# Patient Record
Sex: Female | Born: 1955 | Race: White | Hispanic: No | Marital: Married | Smoking: Former smoker
Health system: Southern US, Community
[De-identification: ages and names within clinical notes are randomized; demographics above are authoritative.]

## PROBLEM LIST (undated history)

## (undated) DIAGNOSIS — E119 Type 2 diabetes mellitus without complications: Secondary | ICD-10-CM

## (undated) DIAGNOSIS — H269 Unspecified cataract: Secondary | ICD-10-CM

## (undated) DIAGNOSIS — T7840XA Allergy, unspecified, initial encounter: Secondary | ICD-10-CM

## (undated) DIAGNOSIS — I10 Essential (primary) hypertension: Secondary | ICD-10-CM

## (undated) DIAGNOSIS — M8580 Other specified disorders of bone density and structure, unspecified site: Secondary | ICD-10-CM

## (undated) DIAGNOSIS — K219 Gastro-esophageal reflux disease without esophagitis: Secondary | ICD-10-CM

## (undated) DIAGNOSIS — F419 Anxiety disorder, unspecified: Secondary | ICD-10-CM

## (undated) DIAGNOSIS — J45909 Unspecified asthma, uncomplicated: Secondary | ICD-10-CM

## (undated) DIAGNOSIS — H3530 Unspecified macular degeneration: Secondary | ICD-10-CM

## (undated) DIAGNOSIS — L719 Rosacea, unspecified: Secondary | ICD-10-CM

## (undated) HISTORY — DX: Other specified disorders of bone density and structure, unspecified site: M85.80

## (undated) HISTORY — DX: Allergy, unspecified, initial encounter: T78.40XA

## (undated) HISTORY — DX: Unspecified cataract: H26.9

## (undated) HISTORY — DX: Unspecified macular degeneration: H35.30

## (undated) HISTORY — DX: Gastro-esophageal reflux disease without esophagitis: K21.9

## (undated) HISTORY — DX: Essential (primary) hypertension: I10

## (undated) HISTORY — DX: Anxiety disorder, unspecified: F41.9

## (undated) HISTORY — DX: Type 2 diabetes mellitus without complications: E11.9

## (undated) HISTORY — DX: Rosacea, unspecified: L71.9

## (undated) HISTORY — PX: CYST EXCISION: SHX5701

## (undated) HISTORY — DX: Unspecified asthma, uncomplicated: J45.909

---

## 2017-08-02 ENCOUNTER — Encounter: Payer: Self-pay | Admitting: Physician Assistant

## 2017-08-02 ENCOUNTER — Ambulatory Visit (INDEPENDENT_AMBULATORY_CARE_PROVIDER_SITE_OTHER): Payer: Federal, State, Local not specified - PPO | Admitting: Physician Assistant

## 2017-08-02 VITALS — BP 136/80 | HR 78 | Temp 97.8°F | Resp 16 | Ht 61.0 in | Wt 177.6 lb

## 2017-08-02 DIAGNOSIS — Z0000 Encounter for general adult medical examination without abnormal findings: Secondary | ICD-10-CM

## 2017-08-02 DIAGNOSIS — I10 Essential (primary) hypertension: Secondary | ICD-10-CM | POA: Diagnosis not present

## 2017-08-02 DIAGNOSIS — L719 Rosacea, unspecified: Secondary | ICD-10-CM

## 2017-08-02 DIAGNOSIS — E119 Type 2 diabetes mellitus without complications: Secondary | ICD-10-CM | POA: Diagnosis not present

## 2017-08-02 DIAGNOSIS — E118 Type 2 diabetes mellitus with unspecified complications: Secondary | ICD-10-CM | POA: Insufficient documentation

## 2017-08-02 DIAGNOSIS — R945 Abnormal results of liver function studies: Secondary | ICD-10-CM | POA: Diagnosis not present

## 2017-08-02 DIAGNOSIS — I152 Hypertension secondary to endocrine disorders: Secondary | ICD-10-CM | POA: Insufficient documentation

## 2017-08-02 MED ORDER — METFORMIN HCL 500 MG PO TABS: 500.0000 mg | ORAL_TABLET | Freq: Two times a day (BID) | ORAL | 1 refills | Status: DC

## 2017-08-02 MED ORDER — LISINOPRIL 10 MG PO TABS: 10.0000 mg | ORAL_TABLET | Freq: Every day | ORAL | 1 refills | Status: DC

## 2017-08-02 NOTE — Progress Notes (Signed)
Patient ID: Jeanne Shaw MRN: [id number], DOB: [birth year], 61 y.o. Date of Encounter: @DATE @  Chief Complaint:  Chief Complaint  Patient presents with  . New Patient (Initial Visit)    HPI: 61 y.o. year old female  presents as a new patient to establish care.  She reports that she recently moved from [place] to [place].  Moved at the end of September.  Reports that her last office visit with her prior provider was in May and she thinks that her last labs were done around April. Reviewed the fact that she is on medication for diabetes and blood pressure.  I asked if she had ever been on cholesterol medication.  She responds that her cholesterol was discussed several years ago and at that time they discussed diet changes for her to make and that since then--the last time it was checked, she was told that it was okay.  She is uncertain of date of her last CPE but states that she has kept up with doing-- mammogram, colonoscopy, and Pap smear---routinely.  She has history of rosacea and uses for this.  This is well controlled with using this.  She has no specific concerns to address today.   Past Medical History:  Diagnosis Date  . Allergy   . Anxiety   . Asthma   . Diabetes mellitus without complication ([place])   . GERD (gastroesophageal reflux disease)   . Hypertension      Home Meds: Outpatient Medications Prior to Visit  Medication Sig Dispense Refill  . aspirin EC 81 MG tablet Take 81 mg by mouth daily.    . cetirizine (ZYRTEC) 5 MG tablet Take 5 mg by mouth daily.    . cholecalciferol (VITAMIN D) 400 units TABS tablet Take 400 Units by mouth daily.    . Fluticasone Furoate 50 MCG/ACT AEPB Inhale into the lungs.    . hydrocortisone 2.5 % cream Apply 1 application topically 2 (two) times daily as needed.    . hydrOXYzine (ATARAX/VISTARIL) 50 MG tablet Take 50 mg by mouth every 6 (six) hours as needed. 1-2 tablets q6h prn for anxiety    . metroNIDAZOLE (METROGEL)  0.75 % gel Apply 1 application topically daily.    Marland Kitchen MILK THISTLE PO Take by mouth.    Marland Kitchen omeprazole (PRILOSEC) 40 MG capsule Take 40 mg by mouth daily.    Marland Kitchen lisinopril (PRINIVIL,ZESTRIL) 10 MG tablet Take 10 mg by mouth daily.    . metFORMIN (GLUCOPHAGE) 500 MG tablet Take by mouth 2 (two) times daily with a meal.    . metFORMIN (GLUCOPHAGE-XR) 500 MG 24 hr tablet Take 500 mg by mouth daily with breakfast.     No facility-administered medications prior to visit.     Allergies:  Allergies  Allergen Reactions  . Hydrocodone Anxiety    Social History   Socioeconomic History  . Marital status: Married    Spouse name: Not on file  . Number of children: Not on file  . Years of education: Not on file  . Highest education level: Not on file  Social Needs  . Financial resource strain: Not on file  . Food insecurity - worry: Not on file  . Food insecurity - inability: Not on file  . Transportation needs - medical: Not on file  . Transportation needs - non-medical: Not on file  Occupational History  . Not on file  Tobacco Use  . Smoking status: Former Smoker    Last attempt to quit: 08/03/2015  Years since quitting: 2.0  . Smokeless tobacco: Former Network engineer and Sexual Activity  . Alcohol use: Yes    Comment: 1 glass wine daily, beer occassional  . Drug use: No  . Sexual activity: Not on file  Other Topics Concern  . Not on file  Social History Narrative  . Not on file    Family History  Problem Relation Age of Onset  . COPD Mother   . Alcohol abuse Father   . Diabetes Father   . Heart disease Father   . Hyperlipidemia Father   . Hypertension Father   . Cancer Sister   . Diabetes Sister   . Hyperlipidemia Sister   . Hypertension Sister   . Hypertension Brother   . Alcohol abuse Maternal Aunt   . Alcohol abuse Maternal Uncle   . Hypertension Maternal Uncle   . Alcohol abuse Paternal Aunt   . Cancer Paternal Aunt   . Alcohol abuse Paternal Uncle   .  Cancer Paternal Uncle   . Diabetes Maternal Grandmother   . Stroke Maternal Grandmother   . Heart disease Maternal Grandfather      Review of Systems:  See HPI for pertinent ROS. All other ROS negative.    Physical Exam: Blood pressure 136/80, pulse 78, temperature 97.8 F (36.6 C), temperature source Oral, resp. rate 16, height 5\' 1"  (1.549 m), weight 80.6 kg (177 lb 9.6 oz), SpO2 98 %., Body mass index is 33.56 kg/m. General: WNWD WF. Appears in no acute distress. Neck: Supple. No thyromegaly. No lymphadenopathy. No carotid bruits. Lungs: Clear bilaterally to auscultation without wheezes, rales, or rhonchi. Breathing is unlabored. Heart: RRR with S1 S2. No murmurs, rubs, or gallops. Abdomen: Soft, non-tender, non-distended with normoactive bowel sounds. No hepatomegaly. No rebound/guarding. No obvious abdominal masses. Musculoskeletal:  Strength and tone normal for age. Extremities/Skin: Warm and dry.  No LE edema.  Neuro: Alert and oriented X 3. Moves all extremities spontaneously. Gait is normal. CNII-XII grossly in tact. Psych:  Responds to questions appropriately with a normal affect.     ASSESSMENT AND PLAN:  61 y.o. year old female with  1. Encounter for medical examination to establish care  2. Type 2 diabetes mellitus without complication, without long-term current use of insulin ([place]) Today we will check lab and will refill her current dose of metformin. At her next visit, will perform and document diabetic foot exam and will also discuss diabetic eye exam. She is not fasting today.  At some point in the future will recheck FLP and consider statin. She is on aspirin 81 mg daily.  She is on ACE inhibitor. - COMPLETE METABOLIC PANEL WITH GFR - Hemoglobin A1c - metFORMIN (GLUCOPHAGE) 500 MG tablet; Take 1 tablet (500 mg total) by mouth 2 (two) times daily with a meal.  Dispense: 180 tablet; Refill: 1  3. Essential hypertension Blood pressure is at goal.  She is on ACE  inhibitor.  Continue current medication.  Check lab to monitor. - COMPLETE METABOLIC PANEL WITH GFR - lisinopril (PRINIVIL,ZESTRIL) 10 MG tablet; Take 1 tablet (10 mg total) by mouth daily.  Dispense: 90 tablet; Refill: 1  4. Rosacea This is controlled with using MetroGel.  Continue current treatment.  I asked if she had ever been on cholesterol medication.  She responds that her cholesterol was discussed several years ago and at that time they discussed diet changes for her to make and that since then--the last time it was checked, she was told  that it was okay.  She is uncertain of date of her last CPE but states that she has kept up with doing-- mammogram, colonoscopy, and Pap smear---routinely.  She will return for routine follow-up visit in 3 months.  Follow-up sooner if needed. At future visit will do microalbumin--- will wait one year since last OV/lab with prior PCP---will check this ~ May 2019. At future visit also will check FLP. At future visit will also update preventive care.    [address] [place], [place], [facility] 08/02/2017 12:38 PM

## 2017-08-05 LAB — COMPLETE METABOLIC PANEL WITH GFR
AG RATIO: 1.8 (calc) (ref 1.0–2.5)
ALBUMIN MSPROF: 4.2 g/dL (ref 3.6–5.1)
ALT: 174 U/L — ABNORMAL HIGH (ref 6–29)
AST: 58 U/L — ABNORMAL HIGH (ref 10–35)
Alkaline phosphatase (APISO): 128 U/L (ref 33–130)
BUN: 15 mg/dL (ref 7–25)
CALCIUM: 10.2 mg/dL (ref 8.6–10.4)
CO2: 29 mmol/L (ref 20–32)
CREATININE: 0.72 mg/dL (ref 0.50–0.99)
Chloride: 97 mmol/L — ABNORMAL LOW (ref 98–110)
GFR, EST AFRICAN AMERICAN: 104 mL/min/{1.73_m2} (ref >=60)
GFR, EST NON AFRICAN AMERICAN: 90 mL/min/{1.73_m2} (ref >=60)
GLOBULIN: 2.4 g/dL (ref 1.9–3.7)
Glucose, Bld: 367 mg/dL — ABNORMAL HIGH (ref 65–99)
POTASSIUM: 5 mmol/L (ref 3.5–5.3)
SODIUM: 133 mmol/L — AB (ref 135–146)
Total Bilirubin: 0.4 mg/dL (ref 0.2–1.2)
Total Protein: 6.6 g/dL (ref 6.1–8.1)

## 2017-08-05 LAB — HEMOGLOBIN A1C
EAG (MMOL/L): 11.7 (calc)
HEMOGLOBIN A1C: 9 %{Hb} — AB (ref <5.7)
MEAN PLASMA GLUCOSE: 212 (calc)

## 2017-08-05 LAB — HEPATITIS PANEL, ACUTE
HEP A IGM: NONREACTIVE
HEP B S AG: NONREACTIVE
Hep B C IgM: NONREACTIVE
Hepatitis C Ab: NONREACTIVE
SIGNAL TO CUT-OFF: 0.01 (ref <1.00)

## 2017-08-05 LAB — TEST AUTHORIZATION

## 2017-08-09 ENCOUNTER — Other Ambulatory Visit: Payer: Self-pay

## 2017-08-09 DIAGNOSIS — R7989 Other specified abnormal findings of blood chemistry: Secondary | ICD-10-CM

## 2017-08-09 DIAGNOSIS — R945 Abnormal results of liver function studies: Principal | ICD-10-CM

## 2017-08-09 MED ORDER — SITAGLIPTIN PHOSPHATE 100 MG PO TABS: 100.0000 mg | ORAL_TABLET | Freq: Every day | ORAL | 0 refills | Status: DC

## 2017-08-09 MED ORDER — METFORMIN HCL 1000 MG PO TABS: 1000.0000 mg | ORAL_TABLET | Freq: Two times a day (BID) | ORAL | 0 refills | Status: DC

## 2017-08-13 ENCOUNTER — Ambulatory Visit (HOSPITAL_COMMUNITY)
Admission: RE | Admit: 2017-08-13 | Discharge: 2017-08-13 | Disposition: A | Discharge status: Home / Self Care | Payer: Federal, State, Local not specified - PPO | Source: Ambulatory Visit | Attending: Physician Assistant | Admitting: Physician Assistant

## 2017-08-13 DIAGNOSIS — R7989 Other specified abnormal findings of blood chemistry: Secondary | ICD-10-CM | POA: Diagnosis not present

## 2017-08-13 DIAGNOSIS — K760 Fatty (change of) liver, not elsewhere classified: Secondary | ICD-10-CM | POA: Insufficient documentation

## 2017-08-13 DIAGNOSIS — R945 Abnormal results of liver function studies: Secondary | ICD-10-CM

## 2017-08-17 ENCOUNTER — Other Ambulatory Visit: Payer: Self-pay

## 2017-08-17 MED ORDER — FLUTICASONE PROPIONATE 50 MCG/ACT NA SUSP: 2.0000 | Freq: Every day | NASAL | 6 refills | Status: DC

## 2017-11-01 ENCOUNTER — Encounter: Payer: Self-pay | Admitting: Physician Assistant

## 2017-11-01 ENCOUNTER — Ambulatory Visit: Payer: Federal, State, Local not specified - PPO | Admitting: Physician Assistant

## 2017-11-01 ENCOUNTER — Other Ambulatory Visit: Payer: Self-pay

## 2017-11-01 VITALS — BP 132/78 | HR 66 | Temp 97.6°F | Resp 16 | Ht 61.0 in | Wt 174.6 lb

## 2017-11-01 DIAGNOSIS — E119 Type 2 diabetes mellitus without complications: Secondary | ICD-10-CM

## 2017-11-01 DIAGNOSIS — I10 Essential (primary) hypertension: Secondary | ICD-10-CM

## 2017-11-01 DIAGNOSIS — Z1231 Encounter for screening mammogram for malignant neoplasm of breast: Secondary | ICD-10-CM

## 2017-11-01 DIAGNOSIS — Z1239 Encounter for other screening for malignant neoplasm of breast: Secondary | ICD-10-CM | POA: Insufficient documentation

## 2017-11-01 NOTE — Progress Notes (Addendum)
Patient ID: Jeanne Shaw MRN: [id number], DOB: [birth year], 62 y.o. Date of Encounter: @DATE @  Chief Complaint:  Chief Complaint  Patient presents with  . 3 month follow up  . tenderness in right breast    HPI: 62 y.o. year old female     08/02/2017:  presents as a new patient to establish care.  She reports that she recently moved from [place] to [place].  Moved at the end of September.  Reports that her last office visit with her prior provider was in May and she thinks that her last labs were done around April. Reviewed the fact that she is on medication for diabetes and blood pressure.  I asked if she had ever been on cholesterol medication.  She responds that her cholesterol was discussed several years ago and at that time they discussed diet changes for her to make and that since then--the last time it was checked, she was told that it was okay.  She is uncertain of date of her last CPE but states that she has kept up with doing-- mammogram, colonoscopy, and Pap smear---routinely.  She has history of rosacea and uses metronidazole for this.  This is well controlled with using this.  She has no specific concerns to address today.   AT THAT OV: --Checked labs.  A1c  was elevated at 9.0. ----Increase metformin to 1000 mg twice daily ----Add Januvia 100 mg 1 p.o. Daily ALT and AST were slightly elevated.  Hepatitis panel was added and negative.  Ultrasound was obtained and showed findings consistent with hepatic steatosis with no other significant abnormality. Remainder of CMET was normal except for the elevated AST and ALT and glucose elevated at 367.    11/01/2017:  Today I reviewed the above information with patient.   She verifies that she did increase the metformin to 1000 mg twice daily.  Verified that she also did add the Januvia 100 mg daily.  She is taking all of these as directed and is having no adverse effects. Today I asked if her prior provider had  discussed need for diabetic eye exams and whether she was aware of proper foot care.  She states that she had not been informed about the eye exams and has not had eye exam in quite a while.  She is aware of proper foot care because of family members who have diabetes and of had problems with their feet.  Today I reviewed with her to make sure to wear shoes and to make sure that if she ever gets any type of sore or wound to come in and get it evaluated especially if it seems to have delayed healing or problems with infection and healing.  She voices understanding and agrees.  She is requesting that I go ahead and put in a referral "to a good eye doctor ". She reports that she has decreased starches and has decreased alcohol intake.  Says that she has increased fruits and vegetables.  Says that she does eat bananas-- especially if she is having cereal---she  likes banana on her cereal.  Also has been drinking orange juice quite often recently.   I have discussed that the other changes are good-- that she has decreased starches and decreased the alcohol and that she has increased vegetables---but have discussed the need to limit orange juice intake as this has lots her sugar.  Voices understanding and agrees.   Past Medical History:  Diagnosis Date  . Allergy   .  Anxiety   . Asthma   . Diabetes mellitus without complication ([place])   . GERD (gastroesophageal reflux disease)   . Hypertension      Home Meds: Outpatient Medications Prior to Visit  Medication Sig Dispense Refill  . aspirin EC 81 MG tablet Take 81 mg by mouth daily.    . cetirizine (ZYRTEC) 5 MG tablet Take 5 mg by mouth daily.    . cholecalciferol (VITAMIN D) 400 units TABS tablet Take 400 Units by mouth daily.    . hydrocortisone 2.5 % cream Apply 1 application topically 2 (two) times daily as needed.    Marland Kitchen lisinopril (PRINIVIL,ZESTRIL) 10 MG tablet Take 1 tablet (10 mg total) by mouth daily. 90 tablet 1  . metFORMIN (GLUCOPHAGE) 1000  MG tablet Take 1 tablet (1,000 mg total) by mouth 2 (two) times daily with a meal. 180 tablet 0  . metroNIDAZOLE (METROGEL) 0.75 % gel Apply 1 application topically daily.    Marland Kitchen MILK THISTLE PO Take by mouth.    Marland Kitchen omeprazole (PRILOSEC) 40 MG capsule Take 40 mg by mouth daily.    . sitaGLIPtin (JANUVIA) 100 MG tablet Take 1 tablet (100 mg total) by mouth daily. 180 tablet 0  . fluticasone (FLONASE) 50 MCG/ACT nasal spray Place 2 sprays into both nostrils daily. 16 g 6  . hydrOXYzine (ATARAX/VISTARIL) 50 MG tablet Take 50 mg by mouth every 6 (six) hours as needed. 1-2 tablets q6h prn for anxiety    . Fluticasone Furoate 50 MCG/ACT AEPB Inhale into the lungs.     No facility-administered medications prior to visit.     Allergies:  Allergies  Allergen Reactions  . Hydrocodone Anxiety    Social History   Socioeconomic History  . Marital status: Married    Spouse name: Not on file  . Number of children: Not on file  . Years of education: Not on file  . Highest education level: Not on file  Occupational History  . Not on file  Social Needs  . Financial resource strain: Not on file  . Food insecurity:    Worry: Not on file    Inability: Not on file  . Transportation needs:    Medical: Not on file    Non-medical: Not on file  Tobacco Use  . Smoking status: Former Smoker    Last attempt to quit: 08/03/2015    Years since quitting: 2.2  . Smokeless tobacco: Former Network engineer and Sexual Activity  . Alcohol use: Yes    Comment: 1 glass wine daily, beer occassional  . Drug use: No  . Sexual activity: Not on file  Lifestyle  . Physical activity:    Days per week: Not on file    Minutes per session: Not on file  . Stress: Not on file  Relationships  . Social connections:    Talks on phone: Not on file    Gets together: Not on file    Attends religious service: Not on file    Active member of club or organization: Not on file    Attends meetings of clubs or organizations:  Not on file    Relationship status: Not on file  . Intimate partner violence:    Fear of current or ex partner: Not on file    Emotionally abused: Not on file    Physically abused: Not on file    Forced sexual activity: Not on file  Other Topics Concern  . Not on file  Social History  Narrative  . Not on file    Family History  Problem Relation Age of Onset  . COPD Mother   . Alcohol abuse Father   . Diabetes Father   . Heart disease Father   . Hyperlipidemia Father   . Hypertension Father   . Cancer Sister   . Diabetes Sister   . Hyperlipidemia Sister   . Hypertension Sister   . Hypertension Brother   . Alcohol abuse Maternal Aunt   . Alcohol abuse Maternal Uncle   . Hypertension Maternal Uncle   . Alcohol abuse Paternal Aunt   . Cancer Paternal Aunt   . Alcohol abuse Paternal Uncle   . Cancer Paternal Uncle   . Diabetes Maternal Grandmother   . Stroke Maternal Grandmother   . Heart disease Maternal Grandfather      Review of Systems:  See HPI for pertinent ROS. All other ROS negative.    Physical Exam: Blood pressure 132/78, pulse 66, temperature 97.6 F (36.4 C), temperature source Oral, resp. rate 16, height 5\' 1"  (1.549 m), weight 79.2 kg (174 lb 9.6 oz), SpO2 98 %., Body mass index is 32.99 kg/m. General: WNWD WF. Appears in no acute distress. Neck: Supple. No thyromegaly. No lymphadenopathy. No carotid bruits. Lungs: Clear bilaterally to auscultation without wheezes, rales, or rhonchi. Breathing is unlabored. Heart: RRR with S1 S2. No murmurs, rubs, or gallops. Abdomen: Soft, non-tender, non-distended with normoactive bowel sounds. No hepatomegaly. No rebound/guarding. No obvious abdominal masses. Musculoskeletal:  Strength and tone normal for age. Extremities/Skin: Warm and dry.  No LE edema.  Diabetic Foot Exam: Inspection is normal.  No wounds, calluses, or problem areas.   1+ bilateral dorsalis pedis pulses.  Trace posterior tibial pulses bilaterally.   Sensation is intact and normal. Neuro: Alert and oriented X 3. Moves all extremities spontaneously. Gait is normal. CNII-XII grossly in tact. Psych:  Responds to questions appropriately with a normal affect.     ASSESSMENT AND PLAN:  62 y.o. year old female with    1. Essential hypertension 11/01/2017:  Blood Pressure is at goal/controlled.  Lab was normal 07/2017. Continue current medications.   - COMPLETE METABOLIC PANEL WITH GFR  2. Type 2 diabetes mellitus without complication, without long-term current use of insulin ([place]) 11/01/2017:   At lab 08/02/17 -- A1c was elevated.   ---------------------------------------She did increase metformin to 1000 twice daily and she did add Januvia 100 mg daily at that time.   ----------------Re-check A1c now to monitor. --------- day we did discuss proper foot care --------- today we did discuss diabetic eye exam and she prefers that I put in a referral so this is put in. ----------She is fasting so will check a lipid panel today. - COMPLETE METABOLIC PANEL WITH GFR - Lipid panel - Hemoglobin A1c - Ambulatory referral to Ophthalmology  She is on aspirin 81 mg daily.  She is on ACE inhibitor.   3. Breast cancer screening She reports that last mammogram was well over a year ago.  Will order follow-up mammogram. - MM DIGITAL SCREENING BILATERAL; Future   4. Rosacea This is controlled with using MetroGel.  Continue current treatment.   PREVENTIVE CARE:  She is uncertain of date of her last CPE but states that she has kept up with doing-- mammogram, colonoscopy, and Pap smear---routinely.   She will return for routine follow-up visit in 3 months.  Follow-up sooner if needed. At future visit will do microalbumin--- will wait one year since last OV/lab with prior  PCP---will check this ~ May 2019. At future visit will also update preventive care.    Signed, [address] [place], [place], [facility] 11/01/2017 11:18 AM

## 2017-11-02 LAB — HEMOGLOBIN A1C
Hgb A1c MFr Bld: 7.4 % of total Hgb — ABNORMAL HIGH (ref <5.7)
MEAN PLASMA GLUCOSE: 166 (calc)
eAG (mmol/L): 9.2 (calc)

## 2017-11-02 LAB — COMPLETE METABOLIC PANEL WITH GFR
AG Ratio: 1.9 (calc) (ref 1.0–2.5)
ALKALINE PHOSPHATASE (APISO): 78 U/L (ref 33–130)
ALT: 66 U/L — AB (ref 6–29)
AST: 27 U/L (ref 10–35)
Albumin: 4 g/dL (ref 3.6–5.1)
BUN: 16 mg/dL (ref 7–25)
CHLORIDE: 105 mmol/L (ref 98–110)
CO2: 28 mmol/L (ref 20–32)
Calcium: 9.6 mg/dL (ref 8.6–10.4)
Creat: 0.7 mg/dL (ref 0.50–0.99)
GFR, Est African American: 108 mL/min/{1.73_m2} (ref >=60)
GFR, Est Non African American: 93 mL/min/{1.73_m2} (ref >=60)
Globulin: 2.1 g/dL (calc) (ref 1.9–3.7)
Glucose, Bld: 168 mg/dL — ABNORMAL HIGH (ref 65–99)
Potassium: 5.1 mmol/L (ref 3.5–5.3)
Sodium: 140 mmol/L (ref 135–146)
Total Bilirubin: 0.4 mg/dL (ref 0.2–1.2)
Total Protein: 6.1 g/dL (ref 6.1–8.1)

## 2017-11-02 LAB — LIPID PANEL
CHOLESTEROL: 161 mg/dL (ref <200)
HDL: 31 mg/dL — AB (ref >50)
LDL Cholesterol (Calc): 96 mg/dL (calc)
Non-HDL Cholesterol (Calc): 130 mg/dL (calc) — ABNORMAL HIGH (ref <130)
TRIGLYCERIDES: 217 mg/dL — AB (ref <150)
Total CHOL/HDL Ratio: 5.2 (calc) — ABNORMAL HIGH (ref <5.0)

## 2017-11-04 ENCOUNTER — Telehealth: Payer: Self-pay

## 2017-11-04 ENCOUNTER — Encounter: Payer: Self-pay | Admitting: Physician Assistant

## 2017-11-04 DIAGNOSIS — N644 Mastodynia: Secondary | ICD-10-CM

## 2017-11-04 NOTE — Telephone Encounter (Signed)
FYI: Patient  Sent email stating because she had breast tenderness we needed to change mammogram order from screening to diagnostic.Change has been made

## 2017-11-04 NOTE — Telephone Encounter (Signed)
Approved.  

## 2017-11-05 ENCOUNTER — Other Ambulatory Visit: Payer: Self-pay

## 2017-11-05 MED ORDER — SIMVASTATIN 10 MG PO TABS: 10.0000 mg | ORAL_TABLET | Freq: Every day | ORAL | 2 refills | Status: DC

## 2017-11-05 NOTE — Progress Notes (Signed)
simvastatin 

## 2017-11-10 ENCOUNTER — Encounter: Payer: Self-pay | Admitting: Physician Assistant

## 2017-11-15 NOTE — Progress Notes (Signed)
[facility] Note  11/16/2017     CHIEF COMPLAINT Patient presents for Diabetic Eye Exam   HISTORY OF PRESENT ILLNESS: Jeanne Shaw is a 62 y.o. female who presents to the clinic today for:   HPI    Diabetic Eye Exam    Vision is stable.  Associated Symptoms Negative for Flashes, Distortion, Pain, Photophobia, Trauma, Jaw Claudication, Fever, Fatigue, Weight Loss, Shoulder/Hip pain, Scalp Tenderness, Glare, Blind Spot, Redness and Floaters.  Diabetes characteristics include Type 2.  This started 1 year ago.  Last Blood Glucose: Doesn't check.  Last A1C 7.4.  I, the attending physician,  performed the HPI with the patient and updated documentation appropriately.          Comments    Pt presents for DM exam; Pt states she was dx with DM x 1 year; Pt states she does not check CBG at home; Pt states last A1C was 7.4; Pt states OU VA is stabel; pt states she has trouble with "watery eyes"; Pt denies floaters, denies flashes, denies wavy VA, denies ocular pain; Pt denies any gtts usage, denies taking any eye vits;        Last edited by Bernarda Caffey, MD on 11/16/2017  9:26 AM. (History)      Referring physician: Orlena Sheldon, PA-C [place] [address] [place] [address]  HISTORICAL INFORMATION:   Selected notes from the MEDICAL RECORD NUMBER Referred by M. Dixon, PA-C for DM exam;  LEE-  Ocular Hx-  PMH- DM (A1C 7.4 on 04.01.19), HTN, asthma,     CURRENT MEDICATIONS: No current outpatient medications on file. (Ophthalmic Drugs)   No current facility-administered medications for this visit.  (Ophthalmic Drugs)   Current Outpatient Medications (Other)  Medication Sig  . aspirin EC 81 MG tablet Take 81 mg by mouth daily.  . cetirizine (ZYRTEC) 5 MG tablet Take 5 mg by mouth daily.  . cholecalciferol (VITAMIN D) 400 units TABS tablet Take 400 Units by mouth daily.  . hydrocortisone 2.5 % cream Apply 1 application topically 2 (two) times  daily as needed.  . hydrOXYzine (ATARAX/VISTARIL) 50 MG tablet Take 50 mg by mouth every 6 (six) hours as needed. 1-2 tablets q6h prn for anxiety  . lisinopril (PRINIVIL,ZESTRIL) 10 MG tablet Take 1 tablet (10 mg total) by mouth daily.  . metFORMIN (GLUCOPHAGE) 1000 MG tablet Take 1 tablet (1,000 mg total) by mouth 2 (two) times daily with a meal.  . metroNIDAZOLE (METROGEL) 0.75 % gel Apply 1 application topically daily.  Marland Kitchen MILK THISTLE PO Take by mouth.  Marland Kitchen omeprazole (PRILOSEC) 40 MG capsule Take 40 mg by mouth daily.  . simvastatin (ZOCOR) 10 MG tablet Take 1 tablet (10 mg total) by mouth at bedtime.  . sitaGLIPtin (JANUVIA) 100 MG tablet Take 1 tablet (100 mg total) by mouth daily.   No current facility-administered medications for this visit.  (Other)      REVIEW OF SYSTEMS: ROS    Positive for: Endocrine, Cardiovascular   Negative for: Constitutional, Gastrointestinal, Neurological, Skin, Genitourinary, Musculoskeletal, HENT, Eyes, Respiratory, Psychiatric, Allergic/Imm, Heme/Lymph   Last edited by Cherrie Gauze, COA on 11/16/2017  9:13 AM. (History)       ALLERGIES Allergies  Allergen Reactions  . Hydrocodone Anxiety    PAST MEDICAL HISTORY Past Medical History:  Diagnosis Date  . Allergy   . Anxiety   . Asthma   . Diabetes mellitus without complication ([place])   .  GERD (gastroesophageal reflux disease)   . Hypertension    History reviewed. No pertinent surgical history.  FAMILY HISTORY Family History  Problem Relation Age of Onset  . COPD Mother   . Alcohol abuse Father   . Diabetes Father   . Heart disease Father   . Hyperlipidemia Father   . Hypertension Father   . Cancer Sister   . Diabetes Sister   . Hyperlipidemia Sister   . Hypertension Sister   . Hypertension Brother   . Alcohol abuse Maternal Aunt   . Alcohol abuse Maternal Uncle   . Hypertension Maternal Uncle   . Alcohol abuse Paternal Aunt   . Cancer Paternal Aunt   . Alcohol abuse  Paternal Uncle   . Cancer Paternal Uncle   . Diabetes Maternal Grandmother   . Stroke Maternal Grandmother   . Heart disease Maternal Grandfather     SOCIAL HISTORY Social History   Tobacco Use  . Smoking status: Former Smoker    Last attempt to quit: 08/03/2015    Years since quitting: 2.2  . Smokeless tobacco: Former Network engineer Use Topics  . Alcohol use: Yes    Comment: 1 glass wine daily, beer occassional  . Drug use: No         OPHTHALMIC EXAM:  Base Eye Exam    Visual Acuity (Snellen - Linear)      Right Left   Dist cc 20/20 20/25   Dist ph cc 20/20 20/20   Correction:  Glasses       Tonometry (Tonopen, 9:24 AM)      Right Left   Pressure 17 18       Pupils      Dark Light Shape React APD   Right 4 3 Round Slow None   Left 4 3 Round Slow None       Visual Fields (Counting fingers)      Left Right    Full Full       Extraocular Movement      Right Left    Full, Ortho Full, Ortho       Neuro/Psych    Oriented x3:  Yes   Mood/Affect:  Normal       Dilation    Both eyes:  1.0% Mydriacyl, 2.5% Phenylephrine @ 9:24 AM        Slit Lamp and Fundus Exam    Slit Lamp Exam      Right Left   Lids/Lashes Dermatochalasis - upper lid Dermatochalasis - upper lid   Conjunctiva/Sclera White and quiet White and quiet   Cornea Clear Clear   Anterior Chamber Deep and quiet Deep and quiet   Iris Round and dilated, No NVI Round and dilated, No NVI   Lens 2+ Nuclear sclerosis, 2+ Cortical cataract 2+ Nuclear sclerosis, 2+ Cortical cataract   Vitreous Mild Vitreous syneresis Mild Vitreous syneresis       Fundus Exam      Right Left   Disc Pink and Sharp Pink and Sharp   C/D Ratio 0.3 0.2   Macula Good foveal reflex, mild RPE mottling and clumping, focal PED superionasal to fovea, No heme or edema Flat, mild RPE mottling and clumping, Drusen, PEDs, No heme or edema   Vessels Mild Copper wiring, AV crossing changes Mild Vascular attenuation, mild AV  crossing changes   Periphery Attached Attached, patch of cobblestoning at 0600        Refraction    Wearing Rx  Sphere Cylinder Axis Add   Right +1.00 +1.50 175 +2.25   Left +1.25 +1.25 018 +2.25   Age:  79yr   Type:  PAL       Manifest Refraction      Sphere Cylinder Axis Dist VA   Right +1.25 +1.50 175 20/20   Left +1.50 +1.25 018 20/20          IMAGING AND PROCEDURES  Imaging and Procedures for 11/16/17  OCT, Retina - OU - Both Eyes       Right Eye Quality was good. Central Foveal Thickness: 294. Progression has no prior data. Findings include normal foveal contour, no IRF, no SRF, pigment epithelial detachment.   Left Eye Quality was good. Central Foveal Thickness: 301. Progression has no prior data. Findings include normal foveal contour, no IRF, no SRF, pigment epithelial detachment, intraretinal hyper-reflective material, retinal drusen .   Notes *Images captured and stored on drive  Diagnosis / Impression: NFP, No IRF/SRF OU Drusen and PEDs OS>OD No DME OU   Clinical management:  See below  Abbreviations: NFP - Normal foveal profile. CME - cystoid macular edema. PED - pigment epithelial detachment. IRF - intraretinal fluid. SRF - subretinal fluid. EZ - ellipsoid zone. ERM - epiretinal membrane. ORA - outer retinal atrophy. ORT - outer retinal tubulation. SRHM - subretinal hyper-reflective material                  ASSESSMENT/PLAN:    ICD-10-CM   1. Diabetes mellitus type 2 without retinopathy ([place]) E11.9   2. Early dry stage nonexudative age-related macular degeneration of both eyes H35.3131   3. Retinal edema H35.81 OCT, Retina - OU - Both Eyes  4. Combined forms of age-related cataract of both eyes H25.813     1. Diabetes mellitus, type 2 without retinopathy - The incidence, risk factors for progression, natural history and treatment options for diabetic retinopathy  were discussed with patient.   - The need for close monitoring of  blood glucose, blood pressure, and serum lipids, avoiding cigarette or any type of tobacco, and the need for long term follow up was also discussed with patient. - f/u in 1 year, sooner prn  2. Non-Exudative ARMD OU-   - scattered central drusen and PEDs OU (OS > OD)  - The incidence, anatomy, and pathology of dry AMD, risk of progression, and the AREDS and AREDS 2 study including smoking risks discussed with patient.  - Recommend amsler grid monitoring  - f/u 2-3 months  3. No retinal edema on exam or OCT  4. Combined form age-related cataract OU- - The symptoms of cataract, surgical options, and treatments and risks were discussed with patient. - discussed diagnosis and progression - not yet visually significant - monitor for now   Ophthalmic Meds Ordered this visit:  No orders of the defined types were placed in this encounter.      Return in about 3 months (around 02/15/2018) for F/U Non-Exu AMD OU, Dilated exam, OCT.  There are no Patient Instructions on file for this visit.   Explained the diagnoses, plan, and follow up with the patient and they expressed understanding.  Patient expressed understanding of the importance of proper follow up care.   This document serves as a record of services personally performed by Gardiner Sleeper, MD, PhD. It was created on their behalf by Catha Brow, [place], a certified ophthalmic assistant. The creation of this record is the provider's dictation and/or activities during the visit.  Electronically signed by: Catha Brow, [place]  11/16/17 12:20 PM   Gardiner Sleeper, M.D., Ph.D. Diseases & Surgery of the Retina and [place] 11/16/17   I have reviewed the above documentation for accuracy and completeness, and I agree with the above. Gardiner Sleeper, M.D., Ph.D. 11/16/17 12:21 PM    Abbreviations: M myopia (nearsighted); A astigmatism; H hyperopia (farsighted); P presbyopia; Mrx spectacle  prescription;  CTL contact lenses; OD right eye; OS left eye; OU both eyes  XT exotropia; ET esotropia; PEK punctate epithelial keratitis; PEE punctate epithelial erosions; DES dry eye syndrome; MGD meibomian gland dysfunction; ATs artificial tears; PFAT's preservative free artificial tears; [place] nuclear sclerotic cataract; PSC posterior subcapsular cataract; ERM epi-retinal membrane; PVD posterior vitreous detachment; RD retinal detachment; DM diabetes mellitus; DR diabetic retinopathy; NPDR non-proliferative diabetic retinopathy; PDR proliferative diabetic retinopathy; CSME clinically significant macular edema; DME diabetic macular edema; dbh dot blot hemorrhages; CWS cotton wool spot; POAG primary open angle glaucoma; C/D cup-to-disc ratio; HVF humphrey visual field; GVF goldmann visual field; OCT optical coherence tomography; IOP intraocular pressure; BRVO Branch retinal vein occlusion; CRVO central retinal vein occlusion; CRAO central retinal artery occlusion; BRAO branch retinal artery occlusion; RT retinal tear; SB scleral buckle; PPV pars plana vitrectomy; VH Vitreous hemorrhage; PRP panretinal laser photocoagulation; IVK intravitreal kenalog; VMT vitreomacular traction; MH Macular hole;  NVD neovascularization of the disc; NVE neovascularization elsewhere; AREDS age related eye disease study; ARMD age related macular degeneration; POAG primary open angle glaucoma; EBMD epithelial/anterior basement membrane dystrophy; ACIOL anterior chamber intraocular lens; IOL intraocular lens; PCIOL posterior chamber intraocular lens; Phaco/IOL phacoemulsification with intraocular lens placement; [place] photorefractive keratectomy; LASIK laser assisted in situ keratomileusis; HTN hypertension; DM diabetes mellitus; COPD chronic obstructive pulmonary disease

## 2017-11-16 ENCOUNTER — Ambulatory Visit (INDEPENDENT_AMBULATORY_CARE_PROVIDER_SITE_OTHER): Payer: Federal, State, Local not specified - PPO | Admitting: Ophthalmology

## 2017-11-16 ENCOUNTER — Encounter (INDEPENDENT_AMBULATORY_CARE_PROVIDER_SITE_OTHER): Payer: Self-pay | Admitting: Ophthalmology

## 2017-11-16 DIAGNOSIS — H3581 Retinal edema: Secondary | ICD-10-CM | POA: Diagnosis not present

## 2017-11-16 DIAGNOSIS — E119 Type 2 diabetes mellitus without complications: Secondary | ICD-10-CM

## 2017-11-16 DIAGNOSIS — H353131 Nonexudative age-related macular degeneration, bilateral, early dry stage: Secondary | ICD-10-CM | POA: Diagnosis not present

## 2017-11-16 DIAGNOSIS — H25813 Combined forms of age-related cataract, bilateral: Secondary | ICD-10-CM | POA: Diagnosis not present

## 2017-12-09 ENCOUNTER — Other Ambulatory Visit: Payer: Self-pay | Admitting: Physician Assistant

## 2017-12-09 DIAGNOSIS — N644 Mastodynia: Secondary | ICD-10-CM

## 2017-12-15 ENCOUNTER — Ambulatory Visit
Admission: RE | Admit: 2017-12-15 | Discharge: 2017-12-15 | Disposition: A | Discharge status: Home / Self Care | Payer: Federal, State, Local not specified - PPO | Source: Ambulatory Visit | Attending: Physician Assistant | Admitting: Physician Assistant

## 2017-12-15 ENCOUNTER — Ambulatory Visit: Payer: Federal, State, Local not specified - PPO

## 2017-12-15 DIAGNOSIS — N644 Mastodynia: Secondary | ICD-10-CM

## 2017-12-15 DIAGNOSIS — R922 Inconclusive mammogram: Secondary | ICD-10-CM | POA: Diagnosis not present

## 2018-01-05 ENCOUNTER — Other Ambulatory Visit: Payer: Self-pay

## 2018-01-05 DIAGNOSIS — I10 Essential (primary) hypertension: Secondary | ICD-10-CM

## 2018-01-05 MED ORDER — CETIRIZINE HCL 5 MG PO TABS: 5.0000 mg | ORAL_TABLET | Freq: Every day | ORAL | 1 refills | Status: DC

## 2018-01-05 MED ORDER — LISINOPRIL 10 MG PO TABS: 10.0000 mg | ORAL_TABLET | Freq: Every day | ORAL | 1 refills | Status: DC

## 2018-01-11 ENCOUNTER — Encounter: Payer: Self-pay | Admitting: Physician Assistant

## 2018-01-11 MED ORDER — OMEPRAZOLE 40 MG PO CPDR: 40.0000 mg | DELAYED_RELEASE_CAPSULE | Freq: Every day | ORAL | 3 refills | Status: DC

## 2018-02-02 ENCOUNTER — Encounter: Payer: Self-pay | Admitting: Physician Assistant

## 2018-02-02 ENCOUNTER — Other Ambulatory Visit: Payer: Self-pay

## 2018-02-02 ENCOUNTER — Ambulatory Visit: Payer: Federal, State, Local not specified - PPO | Admitting: Physician Assistant

## 2018-02-02 VITALS — BP 126/70 | HR 87 | Temp 98.5°F | Resp 16 | Ht 61.0 in | Wt 172.2 lb

## 2018-02-02 DIAGNOSIS — E785 Hyperlipidemia, unspecified: Secondary | ICD-10-CM | POA: Diagnosis not present

## 2018-02-02 DIAGNOSIS — E119 Type 2 diabetes mellitus without complications: Secondary | ICD-10-CM | POA: Diagnosis not present

## 2018-02-02 DIAGNOSIS — L719 Rosacea, unspecified: Secondary | ICD-10-CM | POA: Diagnosis not present

## 2018-02-02 DIAGNOSIS — Z1231 Encounter for screening mammogram for malignant neoplasm of breast: Secondary | ICD-10-CM

## 2018-02-02 DIAGNOSIS — I10 Essential (primary) hypertension: Secondary | ICD-10-CM | POA: Diagnosis not present

## 2018-02-02 DIAGNOSIS — Z1239 Encounter for other screening for malignant neoplasm of breast: Secondary | ICD-10-CM

## 2018-02-02 DIAGNOSIS — E1169 Type 2 diabetes mellitus with other specified complication: Secondary | ICD-10-CM | POA: Insufficient documentation

## 2018-02-02 MED ORDER — CETIRIZINE HCL 5 MG PO TABS: 5.0000 mg | ORAL_TABLET | Freq: Every day | ORAL | 2 refills | Status: DC

## 2018-02-02 MED ORDER — METFORMIN HCL 1000 MG PO TABS: 1000.0000 mg | ORAL_TABLET | Freq: Two times a day (BID) | ORAL | 2 refills | Status: DC

## 2018-02-02 MED ORDER — SIMVASTATIN 10 MG PO TABS: 10.0000 mg | ORAL_TABLET | Freq: Every day | ORAL | 2 refills | Status: DC

## 2018-02-02 NOTE — Progress Notes (Signed)
Patient ID: Jeanne Shaw MRN: [id number], DOB: 1956-02-06, 62 y.o. Date of Encounter: @DATE @  Chief Complaint:  No chief complaint on file.   HPI: 62 y.o. year old female     08/02/2017:  presents as a new patient to establish care.  She reports that she recently moved from [place] to [place].  Moved at the end of September.  Reports that her last office visit with her prior provider was in May and she thinks that her last labs were done around April. Reviewed the fact that she is on medication for diabetes and blood pressure.  I asked if she had ever been on cholesterol medication.  She responds that her cholesterol was discussed several years ago and at that time they discussed diet changes for her to make and that since then--the last time it was checked, she was told that it was okay.  She is uncertain of date of her last CPE but states that she has kept up with doing-- mammogram, colonoscopy, and Pap smear---routinely.  She has history of rosacea and uses metronidazole for this.  This is well controlled with using this.  She has no specific concerns to address today.   AT THAT OV: --Checked labs.  A1c  was elevated at 9.0. ----Increase metformin to 1000 mg twice daily ----Add Januvia 100 mg 1 p.o. Daily ALT and AST were slightly elevated.  Hepatitis panel was added and negative.  Ultrasound was obtained and showed findings consistent with hepatic steatosis with no other significant abnormality. Remainder of CMET was normal except for the elevated AST and ALT and glucose elevated at 367.    11/01/2017:  Today I reviewed the above information with patient.   She verifies that she did increase the metformin to 1000 mg twice daily.  Verified that she also did add the Januvia 100 mg daily.  She is taking all of these as directed and is having no adverse effects. Today I asked if her prior provider had discussed need for diabetic eye exams and whether she was  aware of proper foot care.  She states that she had not been informed about the eye exams and has not had eye exam in quite a while.  She is aware of proper foot care because of family members who have diabetes and of had problems with their feet.  Today I reviewed with her to make sure to wear shoes and to make sure that if she ever gets any type of sore or wound to come in and get it evaluated especially if it seems to have delayed healing or problems with infection and healing.  She voices understanding and agrees.  She is requesting that I go ahead and put in a referral "to a good eye doctor ". She reports that she has decreased starches and has decreased alcohol intake.  Says that she has increased fruits and vegetables.  Says that she does eat bananas-- especially if she is having cereal---she  likes banana on her cereal.  Also has been drinking orange juice quite often recently.   I have discussed that the other changes are good-- that she has decreased starches and decreased the alcohol and that she has increased vegetables---but have discussed the need to limit orange juice intake as this has lots her sugar.  Voices understanding and agrees.   02/02/2018: I reviewed her lab results from 11/01/2017.  LDL was 96.  Recommended for her to add simvastatin 10 mg daily. Today she reports  that she did start the simvastatin and is taking this daily.  Is having no myalgias or other adverse effects. Also reviewed that at her lab 11/01/2017-- A1c was 7.4.  Reviewed that this was still a little high but we had just made a lot of medication changes and that at last visit had discussed decreasing the orange juice and other sugars.  Planned to recheck A1c today before making further changes in meds. She does continue to take the metformin and the Januvia.  These are causing no adverse effects. Ports that she did decrease the orange juice.  Says that they have been eating a lot more vegetables since it is summer.  They  have a garden. She has small amount of small scattered vesicles/papules along ankles bilaterally.  Told her this looks like a mild case of poison oak or poison ivy.  She states that she is going out in the garden almost every day and may be coming in contact with something almost daily when she goes out there.  The areas have been there for couple weeks.  However they are staying down at the low legs ankle level.  Have not been spreading to other areas of the body.  Discussed using oral prednisone but she really does not want to use this unless absolutely necessary.  Prefers to use topical cortisone and calamine lotion.  We will follow-up if rash worsens then would have to add some prednisone. No other concerns to address today.    Past Medical History:  Diagnosis Date  . Allergy   . Anxiety   . Asthma   . Diabetes mellitus without complication ([place])   . GERD (gastroesophageal reflux disease)   . Hypertension      Home Meds: Outpatient Medications Prior to Visit  Medication Sig Dispense Refill  . aspirin EC 81 MG tablet Take 81 mg by mouth daily.    . cetirizine (ZYRTEC) 5 MG tablet Take 1 tablet (5 mg total) by mouth daily. 30 tablet 1  . cholecalciferol (VITAMIN D) 400 units TABS tablet Take 400 Units by mouth daily.    . hydrocortisone 2.5 % cream Apply 1 application topically 2 (two) times daily as needed.    . hydrOXYzine (ATARAX/VISTARIL) 50 MG tablet Take 50 mg by mouth every 6 (six) hours as needed. 1-2 tablets q6h prn for anxiety    . lisinopril (PRINIVIL,ZESTRIL) 10 MG tablet Take 1 tablet (10 mg total) by mouth daily. 90 tablet 1  . metFORMIN (GLUCOPHAGE) 1000 MG tablet Take 1 tablet (1,000 mg total) by mouth 2 (two) times daily with a meal. 180 tablet 0  . metroNIDAZOLE (METROGEL) 0.75 % gel Apply 1 application topically daily.    Marland Kitchen MILK THISTLE PO Take by mouth.    Marland Kitchen omeprazole (PRILOSEC) 40 MG capsule Take 1 capsule (40 mg total) by mouth daily. 90 capsule 3  . simvastatin  (ZOCOR) 10 MG tablet Take 1 tablet (10 mg total) by mouth at bedtime. 30 tablet 2  . sitaGLIPtin (JANUVIA) 100 MG tablet Take 1 tablet (100 mg total) by mouth daily. 180 tablet 0   No facility-administered medications prior to visit.     Allergies:  Allergies  Allergen Reactions  . Hydrocodone Anxiety    Social History   Socioeconomic History  . Marital status: Married    Spouse name: Not on file  . Number of children: Not on file  . Years of education: Not on file  . Highest education level: Not on file  Occupational History  . Not on file  Social Needs  . Financial resource strain: Not on file  . Food insecurity:    Worry: Not on file    Inability: Not on file  . Transportation needs:    Medical: Not on file    Non-medical: Not on file  Tobacco Use  . Smoking status: Former Smoker    Last attempt to quit: 08/03/2015    Years since quitting: 2.5  . Smokeless tobacco: Former Network engineer and Sexual Activity  . Alcohol use: Yes    Comment: 1 glass wine daily, beer occassional  . Drug use: No  . Sexual activity: Not on file  Lifestyle  . Physical activity:    Days per week: Not on file    Minutes per session: Not on file  . Stress: Not on file  Relationships  . Social connections:    Talks on phone: Not on file    Gets together: Not on file    Attends religious service: Not on file    Active member of club or organization: Not on file    Attends meetings of clubs or organizations: Not on file    Relationship status: Not on file  . Intimate partner violence:    Fear of current or ex partner: Not on file    Emotionally abused: Not on file    Physically abused: Not on file    Forced sexual activity: Not on file  Other Topics Concern  . Not on file  Social History Narrative  . Not on file    Family History  Problem Relation Age of Onset  . COPD Mother   . Alcohol abuse Father   . Diabetes Father   . Heart disease Father   . Hyperlipidemia Father   .  Hypertension Father   . Cancer Sister   . Diabetes Sister   . Hyperlipidemia Sister   . Hypertension Sister   . Breast cancer Sister 40  . Hypertension Brother   . Alcohol abuse Maternal Aunt   . Alcohol abuse Maternal Uncle   . Hypertension Maternal Uncle   . Alcohol abuse Paternal Aunt   . Cancer Paternal Aunt   . Alcohol abuse Paternal Uncle   . Cancer Paternal Uncle   . Diabetes Maternal Grandmother   . Stroke Maternal Grandmother   . Heart disease Maternal Grandfather      Review of Systems:  See HPI for pertinent ROS. All other ROS negative.    Physical Exam: Blood pressure 126/70, pulse 87, temperature 98.5 F (36.9 C), temperature source Oral, resp. rate 16, height 5\' 1"  (1.549 m), weight 78.1 kg (172 lb 3.2 oz), SpO2 98 %., Body mass index is 32.54 kg/m. General: Overweight WF. Appears in no acute distress. Neck: Supple. No thyromegaly. No lymphadenopathy. No carotid bruits.  Lungs: Clear bilaterally to auscultation without wheezes, rales, or rhonchi. Breathing is unlabored. Heart: RRR with S1 S2. No murmurs, rubs, or gallops. Abdomen: Soft, non-tender, non-distended with normoactive bowel sounds. No hepatomegaly. No rebound/guarding. No obvious abdominal masses. Musculoskeletal:  Strength and tone normal for age. Extremities/Skin: Warm and dry.  No edema.  Both lower legs she has just a few scattered very small papules and vesicles.  No other areas of rash. Neuro: Alert and oriented X 3. Moves all extremities spontaneously. Gait is normal. CNII-XII grossly in tact. Psych:  Responds to questions appropriately with a normal affect.   Diabetic Foot Exam: Inspection is normal.  No wounds,  calluses, or problem areas.   1+ bilateral dorsalis pedis pulses.  Trace posterior tibial pulses bilaterally.  Sensation is intact and normal.     ASSESSMENT AND PLAN:  62 y.o. year old female with      Type 2 diabetes mellitus without complication, without long-term current use  of insulin ([place]) 11/01/2017:   At lab 08/02/17 -- A1c was elevated.   ---------------------------------------She did increase metformin to 1000 twice daily and she did add Januvia 100 mg daily at that time.   ----------------Re-check A1c now to monitor. --------- day we did discuss proper foot care --------- today we did discuss diabetic eye exam and she prefers that I put in a referral so this is put in. ----------She is fasting so will check a lipid panel today. - COMPLETE METABOLIC PANEL WITH GFR - Lipid panel - Hemoglobin A1c - Ambulatory referral to Ophthalmology  02/02/2018: Diabetes is currently stable and controlled.  Continue current medications.  Recheck A1c.  Check microalbumin. - COMPLETE METABOLIC PANEL WITH GFR - Hemoglobin A1c - Microalbumin, urine  She is on aspirin 81 mg daily.  She is on ACE inhibitor.   Essential hypertension 11/01/2017:  Blood Pressure is at goal/controlled.  Lab was normal 07/2017. Continue current medications.   02/02/2018: She is on ACE inhibitor.  Blood pressure is at goal.  Check lab to monitor. - COMPLETE METABOLIC PANEL WITH GFR  Hyperlipidemia, unspecified hyperlipidemia type 02/02/2018: Labs 11/01/2017 we started simvastatin 10 mg daily.  Today she reports she is taking the simvastatin 10 mg daily.  Recheck FLP LFT today to monitor. - COMPLETE METABOLIC PANEL WITH GFR - Lipid panel   Breast cancer screening She reports that last mammogram was well over a year ago.  Will order follow-up mammogram. - MM DIGITAL SCREENING BILATERAL; Future 02/02/2018: She did have mammogram 12/15/2017.  Negative.  Rosacea This is controlled with using MetroGel.  Continue current treatment.   PREVENTIVE CARE:  She is uncertain of date of her last CPE but states that she has kept up with doing-- mammogram, colonoscopy, and Pap smear---routinely.   She will return for routine follow-up visit in 3 months.  Follow-up sooner if needed. At future visit will also  update preventive care.    Signed, [address] [place], [place], BSFM 02/02/2018 8:00 AM

## 2018-02-03 LAB — LIPID PANEL
CHOL/HDL RATIO: 3.5 (calc) (ref <5.0)
CHOLESTEROL: 114 mg/dL (ref <200)
HDL: 33 mg/dL — ABNORMAL LOW (ref >50)
LDL Cholesterol (Calc): 60 mg/dL (calc)
NON-HDL CHOLESTEROL (CALC): 81 mg/dL (ref <130)
TRIGLYCERIDES: 125 mg/dL (ref <150)

## 2018-02-03 LAB — MICROALBUMIN, URINE: Microalb, Ur: 0.4 mg/dL

## 2018-02-03 LAB — COMPLETE METABOLIC PANEL WITH GFR
AG Ratio: 1.8 (calc) (ref 1.0–2.5)
ALBUMIN MSPROF: 4.2 g/dL (ref 3.6–5.1)
ALKALINE PHOSPHATASE (APISO): 73 U/L (ref 33–130)
ALT: 43 U/L — ABNORMAL HIGH (ref 6–29)
AST: 23 U/L (ref 10–35)
BUN: 16 mg/dL (ref 7–25)
CO2: 25 mmol/L (ref 20–32)
CREATININE: 0.74 mg/dL (ref 0.50–0.99)
Calcium: 9.8 mg/dL (ref 8.6–10.4)
Chloride: 102 mmol/L (ref 98–110)
GFR, EST AFRICAN AMERICAN: 101 mL/min/{1.73_m2} (ref >=60)
GFR, Est Non African American: 87 mL/min/{1.73_m2} (ref >=60)
Globulin: 2.3 g/dL (calc) (ref 1.9–3.7)
Glucose, Bld: 146 mg/dL — ABNORMAL HIGH (ref 65–99)
Potassium: 5 mmol/L (ref 3.5–5.3)
SODIUM: 137 mmol/L (ref 135–146)
TOTAL PROTEIN: 6.5 g/dL (ref 6.1–8.1)
Total Bilirubin: 0.3 mg/dL (ref 0.2–1.2)

## 2018-02-03 LAB — HEMOGLOBIN A1C
HEMOGLOBIN A1C: 7 %{Hb} — AB (ref <5.7)
Mean Plasma Glucose: 154 (calc)
eAG (mmol/L): 8.5 (calc)

## 2018-02-13 ENCOUNTER — Other Ambulatory Visit: Payer: Self-pay | Admitting: Physician Assistant

## 2018-02-14 NOTE — Progress Notes (Signed)
[facility] Note  02/15/2018     CHIEF COMPLAINT Patient presents for Retina Follow Up   HISTORY OF PRESENT ILLNESS: Jeanne Shaw is a 62 y.o. female who presents to the clinic today for:   HPI    Retina Follow Up    Patient presents with  Dry AMD.  In both eyes.  This started 3 months ago.  Severity is mild.  Since onset it is stable.  I, the attending physician,  performed the HPI with the patient and updated documentation appropriately.          Comments    F/U Non-Exu AMD OD. Patient states she has "occasional watery eyes, but other than that her vision is about the same" , denies new visual onsets. Pt reports she does not monitor Bs, but BS are stable per patient. Last A1C 7. appx three months ago.       Last edited by Bernarda Caffey, MD on 02/15/2018  9:37 AM. (History)      Referring physician: Orlena Sheldon, PA-C [place] [address], [place] [address]  HISTORICAL INFORMATION:   Selected notes from the MEDICAL RECORD NUMBER Referred by M. Dixon, PA-C for DM exam;  LEE-  Ocular Hx-  PMH- DM (A1C 7.4 on 04.01.19), HTN, asthma,     CURRENT MEDICATIONS: No current outpatient medications on file. (Ophthalmic Drugs)   No current facility-administered medications for this visit.  (Ophthalmic Drugs)   Current Outpatient Medications (Other)  Medication Sig  . aspirin EC 81 MG tablet Take 81 mg by mouth daily.  . cetirizine (ZYRTEC) 5 MG tablet Take 1 tablet (5 mg total) by mouth daily.  . cholecalciferol (VITAMIN D) 400 units TABS tablet Take 400 Units by mouth daily.  Marland Kitchen JANUVIA 100 MG tablet TAKE 1 TABLET BY MOUTH ONCE DAILY  . lisinopril (PRINIVIL,ZESTRIL) 10 MG tablet Take 1 tablet (10 mg total) by mouth daily.  . metFORMIN (GLUCOPHAGE) 1000 MG tablet Take 1 tablet (1,000 mg total) by mouth 2 (two) times daily with a meal.  . metroNIDAZOLE (METROGEL) 0.75 % gel Apply 1 application topically daily.  Marland Kitchen MILK THISTLE PO  Take by mouth.  Marland Kitchen omeprazole (PRILOSEC) 40 MG capsule Take 1 capsule (40 mg total) by mouth daily.  . simvastatin (ZOCOR) 10 MG tablet Take 1 tablet (10 mg total) by mouth at bedtime.   No current facility-administered medications for this visit.  (Other)      REVIEW OF SYSTEMS: ROS    Negative for: Constitutional, Gastrointestinal, Neurological, Skin, Genitourinary, Musculoskeletal, HENT, Endocrine, Cardiovascular, Eyes, Respiratory, Psychiatric, Allergic/Imm, Heme/Lymph   Last edited by Zenovia Jordan, LPN on 4/33/2951  8:84 AM. (History)       ALLERGIES Allergies  Allergen Reactions  . Hydrocodone Anxiety    PAST MEDICAL HISTORY Past Medical History:  Diagnosis Date  . Allergy   . Anxiety   . Asthma   . Diabetes mellitus without complication ([place])   . GERD (gastroesophageal reflux disease)   . Hypertension    History reviewed. No pertinent surgical history.  FAMILY HISTORY Family History  Problem Relation Age of Onset  . COPD Mother   . Alcohol abuse Father   . Diabetes Father   . Heart disease Father   . Hyperlipidemia Father   . Hypertension Father   . Cancer Sister   . Diabetes Sister   . Hyperlipidemia Sister   . Hypertension Sister   . Breast cancer Sister  69  . Hypertension Brother   . Alcohol abuse Maternal Aunt   . Alcohol abuse Maternal Uncle   . Hypertension Maternal Uncle   . Alcohol abuse Paternal Aunt   . Cancer Paternal Aunt   . Alcohol abuse Paternal Uncle   . Cancer Paternal Uncle   . Diabetes Maternal Grandmother   . Stroke Maternal Grandmother   . Heart disease Maternal Grandfather     SOCIAL HISTORY Social History   Tobacco Use  . Smoking status: Former Smoker    Last attempt to quit: 08/03/2015    Years since quitting: 2.5  . Smokeless tobacco: Former Network engineer Use Topics  . Alcohol use: Yes    Comment: 1 glass wine daily, beer occassional  . Drug use: No         OPHTHALMIC EXAM:  Base Eye Exam    Visual  Acuity (Snellen - Linear)      Right Left   Dist cc 20/20 20/25   Dist ph cc  20/20   Correction:  Glasses       Tonometry (Tonopen, 9:13 AM)      Right Left   Pressure 16 16       Pupils      Dark Light Shape React APD   Right 4 3 Round Brisk None   Left 4 3 Round Brisk None       Visual Fields (Counting fingers)      Left Right    Full Full       Extraocular Movement      Right Left    Full, Ortho Full, Ortho       Neuro/Psych    Oriented x3:  Yes   Mood/Affect:  Normal       Dilation    Both eyes:  1.0% Mydriacyl, 2.5% Phenylephrine @ 9:14 AM        Slit Lamp and Fundus Exam    Slit Lamp Exam      Right Left   Lids/Lashes Dermatochalasis - upper lid Dermatochalasis - upper lid   Conjunctiva/Sclera White and quiet White and quiet   Cornea Clear Clear   Anterior Chamber Deep and quiet Deep and quiet   Iris Round and dilated, No NVI Round and dilated, No NVI   Lens 2+ Nuclear sclerosis, 2+ Cortical cataract 2+ Nuclear sclerosis, 2+ Cortical cataract   Vitreous Mild Vitreous syneresis Mild Vitreous syneresis       Fundus Exam      Right Left   Disc Pink and Sharp Pink and Sharp   C/D Ratio 0.3 0.2   Macula Good foveal reflex, mild RPE mottling and clumping, focal PED superonasal to fovea, No heme or edema Flat, Blunted foveal reflex, mild RPE mottling and clumping, Drusen, PEDs, No heme or edema   Vessels Mild Copper wiring, AV crossing changes Mild Vascular attenuation, mild AV crossing changes   Periphery Attached Attached, patch of cobblestoning at 0600          IMAGING AND PROCEDURES  Imaging and Procedures for 11/16/17  OCT, Retina - OU - Both Eyes       Right Eye Quality was good. Central Foveal Thickness: 294. Progression has been stable. Findings include normal foveal contour, no IRF, no SRF, pigment epithelial detachment (Focal PED nasal to fovea stable from prior).   Left Eye Quality was good. Central Foveal Thickness: 282. Progression  has been stable. Findings include normal foveal contour, no IRF, no SRF, pigment epithelial detachment,  intraretinal hyper-reflective material, retinal drusen  (Mild interval improvement in PED).   Notes *Images captured and stored on drive  Diagnosis / Impression: NFP, No IRF/SRF OU Drusen and PEDs OS>OD No DME OU   Clinical management:  See below  Abbreviations: NFP - Normal foveal profile. CME - cystoid macular edema. PED - pigment epithelial detachment. IRF - intraretinal fluid. SRF - subretinal fluid. EZ - ellipsoid zone. ERM - epiretinal membrane. ORA - outer retinal atrophy. ORT - outer retinal tubulation. SRHM - subretinal hyper-reflective material                  ASSESSMENT/PLAN:    ICD-10-CM   1. Early dry stage nonexudative age-related macular degeneration of both eyes H35.3131   2. Diabetes mellitus type 2 without retinopathy ([place]) E11.9 OCT, Retina - OU - Both Eyes  3. Retinal edema H35.81 OCT, Retina - OU - Both Eyes  4. Combined forms of age-related cataract of both eyes H25.813     1. Non-Exudative ARMD OU-   - scattered central drusen and PEDs OU (OS > OD)  - The incidence, anatomy, and pathology of dry AMD, risk of progression, and the AREDS and AREDS 2 study including smoking risks discussed with patient.  - continue amsler grid monitoring  - f/u 6 months  2. Diabetes mellitus, type 2 without retinopathy - The incidence, risk factors for progression, natural history and treatment options for diabetic retinopathy  were discussed with patient.   - The need for close monitoring of blood glucose, blood pressure, and serum lipids, avoiding cigarette or any type of tobacco, and the need for long term follow up was also discussed with patient. - f/u in 1 year, sooner prn   3. No retinal edema on exam or OCT  4. Combined form age-related cataract OU- - The symptoms of cataract, surgical options, and treatments and risks were discussed with patient. -  discussed diagnosis and progression - not yet visually significant - monitor for now   Ophthalmic Meds Ordered this visit:  No orders of the defined types were placed in this encounter.      Return in about 6 months (around 08/18/2018) for F/U Non-Exu AMD OU, DFE, OCT.  There are no Patient Instructions on file for this visit.   Explained the diagnoses, plan, and follow up with the patient and they expressed understanding.  Patient expressed understanding of the importance of proper follow up care.   This document serves as a record of services personally performed by Gardiner Sleeper, MD, PhD. It was created on their behalf by Catha Brow, [place], a certified ophthalmic assistant. The creation of this record is the provider's dictation and/or activities during the visit.  Electronically signed by: Catha Brow, [place]  07.15.19 2:49 PM   Gardiner Sleeper, M.D., Ph.D. Diseases & Surgery of the Retina and Vitreous [facility]  I have reviewed the above documentation for accuracy and completeness, and I agree with the above. Gardiner Sleeper, M.D., Ph.D. 02/15/18 2:49 PM   Abbreviations: M myopia (nearsighted); A astigmatism; H hyperopia (farsighted); P presbyopia; Mrx spectacle prescription;  CTL contact lenses; OD right eye; OS left eye; OU both eyes  XT exotropia; ET esotropia; PEK punctate epithelial keratitis; PEE punctate epithelial erosions; DES dry eye syndrome; MGD meibomian gland dysfunction; ATs artificial tears; PFAT's preservative free artificial tears; [place] nuclear sclerotic cataract; PSC posterior subcapsular cataract; ERM epi-retinal membrane; PVD posterior vitreous detachment; RD retinal detachment; DM diabetes  mellitus; DR diabetic retinopathy; NPDR non-proliferative diabetic retinopathy; PDR proliferative diabetic retinopathy; CSME clinically significant macular edema; DME diabetic macular edema; dbh dot blot hemorrhages; CWS cotton wool spot; POAG  primary open angle glaucoma; C/D cup-to-disc ratio; HVF humphrey visual field; GVF goldmann visual field; OCT optical coherence tomography; IOP intraocular pressure; BRVO Branch retinal vein occlusion; CRVO central retinal vein occlusion; CRAO central retinal artery occlusion; BRAO branch retinal artery occlusion; RT retinal tear; SB scleral buckle; PPV pars plana vitrectomy; VH Vitreous hemorrhage; PRP panretinal laser photocoagulation; IVK intravitreal kenalog; VMT vitreomacular traction; MH Macular hole;  NVD neovascularization of the disc; NVE neovascularization elsewhere; AREDS age related eye disease study; ARMD age related macular degeneration; POAG primary open angle glaucoma; EBMD epithelial/anterior basement membrane dystrophy; ACIOL anterior chamber intraocular lens; IOL intraocular lens; PCIOL posterior chamber intraocular lens; Phaco/IOL phacoemulsification with intraocular lens placement; [place] photorefractive keratectomy; LASIK laser assisted in situ keratomileusis; HTN hypertension; DM diabetes mellitus; COPD chronic obstructive pulmonary disease

## 2018-02-15 ENCOUNTER — Ambulatory Visit (INDEPENDENT_AMBULATORY_CARE_PROVIDER_SITE_OTHER): Payer: Federal, State, Local not specified - PPO | Admitting: Ophthalmology

## 2018-02-15 ENCOUNTER — Encounter (INDEPENDENT_AMBULATORY_CARE_PROVIDER_SITE_OTHER): Payer: Self-pay | Admitting: Ophthalmology

## 2018-02-15 DIAGNOSIS — H25813 Combined forms of age-related cataract, bilateral: Secondary | ICD-10-CM

## 2018-02-15 DIAGNOSIS — H3581 Retinal edema: Secondary | ICD-10-CM

## 2018-02-15 DIAGNOSIS — E119 Type 2 diabetes mellitus without complications: Secondary | ICD-10-CM

## 2018-02-15 DIAGNOSIS — H353131 Nonexudative age-related macular degeneration, bilateral, early dry stage: Secondary | ICD-10-CM | POA: Diagnosis not present

## 2018-03-07 ENCOUNTER — Encounter: Payer: Self-pay | Admitting: Physician Assistant

## 2018-06-21 ENCOUNTER — Encounter: Payer: Self-pay | Admitting: Physician Assistant

## 2018-08-08 ENCOUNTER — Ambulatory Visit: Payer: Federal, State, Local not specified - PPO | Admitting: Physician Assistant

## 2018-08-17 NOTE — Progress Notes (Signed)
[facility] Note  08/18/2018     CHIEF COMPLAINT Patient presents for Retina Follow Up   HISTORY OF PRESENT ILLNESS: Jeanne Shaw is a 63 y.o. female who presents to the clinic today for:   HPI    Retina Follow Up    Patient presents with  Dry AMD.  In both eyes.  This started 9.  Severity is mild.  Since onset it is stable.  I, the attending physician,  performed the HPI with the patient and updated documentation appropriately.          Comments    F/U NON EXU W AMD.OU. Patient states her Ara Kussmaul has been "good", she received new glasses a month ago. Denies new visual onsets/issues. BS 242 one week ago, (does not monitor QD), A1C 7.0 (01/2018), Denies Hyper/ hypoglycemic episodes.Pt continues to take Metformin and Januvia .       Last edited by Bernarda Caffey, MD on 08/18/2018  9:58 AM. (History)      Referring physician: Orlena Sheldon, PA-C No address on file  HISTORICAL INFORMATION:   Selected notes from the [place] Referred by M. Dixon, PA-C for DM exam;  LEE-  Ocular Hx-  PMH- DM (A1C 7.4 on 04.01.19), HTN, asthma,     CURRENT MEDICATIONS: No current outpatient medications on file. (Ophthalmic Drugs)   No current facility-administered medications for this visit.  (Ophthalmic Drugs)   Current Outpatient Medications (Other)  Medication Sig  . aspirin EC 81 MG tablet Take 81 mg by mouth daily.  . cetirizine (ZYRTEC) 5 MG tablet Take 1 tablet (5 mg total) by mouth daily.  . cholecalciferol (VITAMIN D) 400 units TABS tablet Take 400 Units by mouth daily.  Marland Kitchen JANUVIA 100 MG tablet TAKE 1 TABLET BY MOUTH ONCE DAILY  . lisinopril (PRINIVIL,ZESTRIL) 10 MG tablet Take 1 tablet (10 mg total) by mouth daily.  . metFORMIN (GLUCOPHAGE) 1000 MG tablet Take 1 tablet (1,000 mg total) by mouth 2 (two) times daily with a meal.  . metroNIDAZOLE (METROGEL) 0.75 % gel Apply 1 application topically daily.  Marland Kitchen MILK THISTLE PO Take by  mouth.  Marland Kitchen omeprazole (PRILOSEC) 40 MG capsule Take 1 capsule (40 mg total) by mouth daily.  . simvastatin (ZOCOR) 10 MG tablet Take 1 tablet (10 mg total) by mouth at bedtime.   No current facility-administered medications for this visit.  (Other)      REVIEW OF SYSTEMS: ROS    Positive for: Endocrine, Eyes   Negative for: Constitutional, Gastrointestinal, Neurological, Skin, Genitourinary, Musculoskeletal, HENT, Cardiovascular, Respiratory, Psychiatric, Allergic/Imm, Heme/Lymph   Last edited by Zenovia Jordan, LPN on 3/71/6967  8:93 AM. (History)       ALLERGIES Allergies  Allergen Reactions  . Hydrocodone Anxiety    PAST MEDICAL HISTORY Past Medical History:  Diagnosis Date  . Allergy   . Anxiety   . Asthma   . Diabetes mellitus without complication ([place])   . GERD (gastroesophageal reflux disease)   . Hypertension    History reviewed. No pertinent surgical history.  FAMILY HISTORY Family History  Problem Relation Age of Onset  . COPD Mother   . Alcohol abuse Father   . Diabetes Father   . Heart disease Father   . Hyperlipidemia Father   . Hypertension Father   . Cancer Sister   . Diabetes Sister   . Hyperlipidemia Sister   . Hypertension Sister   . Breast cancer Sister 53  .  Hypertension Brother   . Alcohol abuse Maternal Aunt   . Alcohol abuse Maternal Uncle   . Hypertension Maternal Uncle   . Alcohol abuse Paternal Aunt   . Cancer Paternal Aunt   . Alcohol abuse Paternal Uncle   . Cancer Paternal Uncle   . Diabetes Maternal Grandmother   . Stroke Maternal Grandmother   . Heart disease Maternal Grandfather     SOCIAL HISTORY Social History   Tobacco Use  . Smoking status: Former Smoker    Last attempt to quit: 08/03/2015    Years since quitting: 3.0  . Smokeless tobacco: Former Network engineer Use Topics  . Alcohol use: Yes    Comment: 1 glass wine daily, beer occassional  . Drug use: No         OPHTHALMIC EXAM:  Base Eye Exam     Visual Acuity (Snellen - Linear)      Right Left   Dist cc 20/20 -1 20/25   Dist ph cc NI NI   Correction:  Glasses       Tonometry (Tonopen, 9:23 AM)      Right Left   Pressure 14 14       Pupils      Dark Light Shape React APD   Right 3 2 Round Brisk None   Left 3 2 Round Brisk None       Visual Fields (Counting fingers)      Left Right    Full Full       Extraocular Movement      Right Left    Full, Ortho Full, Ortho       Neuro/Psych    Oriented x3:  Yes   Mood/Affect:  Normal       Dilation    Both eyes:  1.0% Mydriacyl, 2.5% Phenylephrine @ 9:17 AM        Slit Lamp and Fundus Exam    Slit Lamp Exam      Right Left   Lids/Lashes Dermatochalasis - upper lid Dermatochalasis - upper lid   Conjunctiva/Sclera White and quiet White and quiet   Cornea Clear Clear   Anterior Chamber Deep and quiet Deep and quiet   Iris Round and dilated, No NVI Round and dilated, No NVI   Lens 2+ Nuclear sclerosis, 2+ Cortical cataract 2+ Nuclear sclerosis, 2+ Cortical cataract   Vitreous Mild Vitreous syneresis Mild Vitreous syneresis       Fundus Exam      Right Left   Disc Pink and Sharp Pink and Sharp   C/D Ratio 0.3 0.2   Macula Good foveal reflex, mild RPE mottling and clumping, focal PED superonasal to fovea, No heme or edema Flat, Blunted foveal reflex, drusen, mild RPE mottling and clumping, +PEDs, No heme or edema   Vessels AV crossing changes, Tortuous Mild Vascular attenuation, Tortuous   Periphery Attached Attached, patch of cobblestoning at 0600          IMAGING AND PROCEDURES  Imaging and Procedures for 11/16/17  OCT, Retina - OU - Both Eyes       Right Eye Quality was good. Central Foveal Thickness: 292. Progression has been stable. Findings include normal foveal contour, no IRF, no SRF, pigment epithelial detachment (Focal PED nasal to fovea stable from prior).   Left Eye Quality was good. Central Foveal Thickness: 286. Progression has been  stable. Findings include normal foveal contour, no IRF, no SRF, pigment epithelial detachment, intraretinal hyper-reflective material, retinal drusen  (Mild  interval increase in PED, trace SRF ovelying superior PED).   Notes *Images captured and stored on drive  Diagnosis / Impression: NFP, No IRF/SRF OU Drusen and PEDs OS>OD No DME OU   Clinical management:  See below  Abbreviations: NFP - Normal foveal profile. CME - cystoid macular edema. PED - pigment epithelial detachment. IRF - intraretinal fluid. SRF - subretinal fluid. EZ - ellipsoid zone. ERM - epiretinal membrane. ORA - outer retinal atrophy. ORT - outer retinal tubulation. SRHM - subretinal hyper-reflective material                  ASSESSMENT/PLAN:    ICD-10-CM   1. Intermediate stage nonexudative age-related macular degeneration of both eyes H35.3132   2. Diabetes mellitus type 2 without retinopathy ([place]) E11.9   3. Retinal edema H35.81 OCT, Retina - OU - Both Eyes  4. Combined forms of age-related cataract of both eyes H25.813     1. Non-Exudative ARMD OU-   - scattered central drusen and PEDs OU (OS > OD)  - OCT w/ ?tr inc in PED and overlying fluid OS  - The incidence, anatomy, and pathology of dry AMD, risk of progression, and the AREDS and AREDS 2 study including smoking risks discussed with patient.  - continue amsler grid monitoring  - f/u 4 months for repeat DFE/OCT  2. Diabetes mellitus, type 2 without retinopathy - The incidence, risk factors for progression, natural history and treatment options for diabetic retinopathy  were discussed with patient.   - The need for close monitoring of blood glucose, blood pressure, and serum lipids, avoiding cigarette or any type of tobacco, and the need for long term follow up was also discussed with patient. - f/u in 1 year, sooner prn   3. No retinal edema on exam or OCT  4. Combined form age-related cataract OU- - The symptoms of cataract, surgical  options, and treatments and risks were discussed with patient. - discussed diagnosis and progression - not yet visually significant - Monitor for now   Ophthalmic Meds Ordered this visit:  No orders of the defined types were placed in this encounter.      Return in about 4 months (around 12/17/2018) for f/u ARMD OU, DFE, OCT.  There are no Patient Instructions on file for this visit.   Explained the diagnoses, plan, and follow up with the patient and they expressed understanding.  Patient expressed understanding of the importance of proper follow up care.   This document serves as a record of services personally performed by Gardiner Sleeper, MD, PhD. It was created on their behalf by Ernest Mallick, OA, an ophthalmic assistant. The creation of this record is the provider's dictation and/or activities during the visit.    Electronically signed by: Ernest Mallick, OA  12:27 AM    Gardiner Sleeper, M.D., Ph.D. Diseases & Surgery of the Retina and Vitreous [facility]  I have reviewed the above documentation for accuracy and completeness, and I agree with the above. Gardiner Sleeper, M.D., Ph.D. 08/19/18 12:27 AM   Abbreviations: M myopia (nearsighted); A astigmatism; H hyperopia (farsighted); P presbyopia; Mrx spectacle prescription;  CTL contact lenses; OD right eye; OS left eye; OU both eyes  XT exotropia; ET esotropia; PEK punctate epithelial keratitis; PEE punctate epithelial erosions; DES dry eye syndrome; MGD meibomian gland dysfunction; ATs artificial tears; PFAT's preservative free artificial tears; [place] nuclear sclerotic cataract; PSC posterior subcapsular cataract; ERM epi-retinal membrane; PVD posterior vitreous  detachment; RD retinal detachment; DM diabetes mellitus; DR diabetic retinopathy; NPDR non-proliferative diabetic retinopathy; PDR proliferative diabetic retinopathy; CSME clinically significant macular edema; DME diabetic macular edema; dbh dot blot  hemorrhages; CWS cotton wool spot; POAG primary open angle glaucoma; C/D cup-to-disc ratio; HVF humphrey visual field; GVF goldmann visual field; OCT optical coherence tomography; IOP intraocular pressure; BRVO Branch retinal vein occlusion; CRVO central retinal vein occlusion; CRAO central retinal artery occlusion; BRAO branch retinal artery occlusion; RT retinal tear; SB scleral buckle; PPV pars plana vitrectomy; VH Vitreous hemorrhage; PRP panretinal laser photocoagulation; IVK intravitreal kenalog; VMT vitreomacular traction; MH Macular hole;  NVD neovascularization of the disc; NVE neovascularization elsewhere; AREDS age related eye disease study; ARMD age related macular degeneration; POAG primary open angle glaucoma; EBMD epithelial/anterior basement membrane dystrophy; ACIOL anterior chamber intraocular lens; IOL intraocular lens; PCIOL posterior chamber intraocular lens; Phaco/IOL phacoemulsification with intraocular lens placement; [place] photorefractive keratectomy; LASIK laser assisted in situ keratomileusis; HTN hypertension; DM diabetes mellitus; COPD chronic obstructive pulmonary disease

## 2018-08-18 ENCOUNTER — Ambulatory Visit (INDEPENDENT_AMBULATORY_CARE_PROVIDER_SITE_OTHER): Payer: Federal, State, Local not specified - PPO | Admitting: Ophthalmology

## 2018-08-18 ENCOUNTER — Encounter (INDEPENDENT_AMBULATORY_CARE_PROVIDER_SITE_OTHER): Payer: Self-pay | Admitting: Ophthalmology

## 2018-08-18 DIAGNOSIS — H353132 Nonexudative age-related macular degeneration, bilateral, intermediate dry stage: Secondary | ICD-10-CM

## 2018-08-18 DIAGNOSIS — E119 Type 2 diabetes mellitus without complications: Secondary | ICD-10-CM

## 2018-08-18 DIAGNOSIS — H25813 Combined forms of age-related cataract, bilateral: Secondary | ICD-10-CM | POA: Diagnosis not present

## 2018-08-18 DIAGNOSIS — H3581 Retinal edema: Secondary | ICD-10-CM

## 2018-08-19 ENCOUNTER — Encounter (INDEPENDENT_AMBULATORY_CARE_PROVIDER_SITE_OTHER): Payer: Self-pay | Admitting: Ophthalmology

## 2018-08-23 ENCOUNTER — Other Ambulatory Visit: Payer: Self-pay | Admitting: *Deleted

## 2018-08-23 DIAGNOSIS — I10 Essential (primary) hypertension: Secondary | ICD-10-CM

## 2018-08-23 MED ORDER — LISINOPRIL 10 MG PO TABS: 10.0000 mg | ORAL_TABLET | Freq: Every day | ORAL | 1 refills | Status: DC

## 2018-08-23 MED ORDER — SITAGLIPTIN PHOSPHATE 100 MG PO TABS: 100.0000 mg | ORAL_TABLET | Freq: Every day | ORAL | 0 refills | Status: DC

## 2018-08-26 ENCOUNTER — Encounter: Payer: Self-pay | Admitting: Family Medicine

## 2018-09-02 ENCOUNTER — Encounter: Payer: Self-pay | Admitting: Family Medicine

## 2018-09-02 ENCOUNTER — Ambulatory Visit: Payer: Federal, State, Local not specified - PPO | Admitting: Family Medicine

## 2018-09-02 VITALS — BP 130/80 | HR 81 | Temp 97.6°F | Resp 15 | Ht 61.0 in | Wt 175.5 lb

## 2018-09-02 DIAGNOSIS — I10 Essential (primary) hypertension: Secondary | ICD-10-CM

## 2018-09-02 DIAGNOSIS — E119 Type 2 diabetes mellitus without complications: Secondary | ICD-10-CM | POA: Diagnosis not present

## 2018-09-02 DIAGNOSIS — E66811 Obesity, class 1: Secondary | ICD-10-CM | POA: Insufficient documentation

## 2018-09-02 DIAGNOSIS — G8929 Other chronic pain: Secondary | ICD-10-CM

## 2018-09-02 DIAGNOSIS — E785 Hyperlipidemia, unspecified: Secondary | ICD-10-CM | POA: Diagnosis not present

## 2018-09-02 DIAGNOSIS — E669 Obesity, unspecified: Secondary | ICD-10-CM | POA: Diagnosis not present

## 2018-09-02 DIAGNOSIS — M25511 Pain in right shoulder: Secondary | ICD-10-CM

## 2018-09-02 MED ORDER — METFORMIN HCL 1000 MG PO TABS: 1000.0000 mg | ORAL_TABLET | Freq: Two times a day (BID) | ORAL | 2 refills | Status: DC

## 2018-09-02 MED ORDER — LISINOPRIL 10 MG PO TABS: 10.0000 mg | ORAL_TABLET | Freq: Every day | ORAL | 2 refills | Status: DC

## 2018-09-02 MED ORDER — SITAGLIPTIN PHOSPHATE 100 MG PO TABS: 100.0000 mg | ORAL_TABLET | Freq: Every day | ORAL | 2 refills | Status: DC

## 2018-09-02 MED ORDER — SIMVASTATIN 10 MG PO TABS: 10.0000 mg | ORAL_TABLET | Freq: Every day | ORAL | 2 refills | Status: DC

## 2018-09-02 MED ORDER — OMEPRAZOLE 40 MG PO CPDR: 40.0000 mg | DELAYED_RELEASE_CAPSULE | Freq: Every day | ORAL | 3 refills | Status: DC

## 2018-09-02 NOTE — Progress Notes (Signed)
Subjective:    Patient ID: Jeanne Shaw, female    DOB: 1956-02-12, 63 y.o.   MRN: [id number]  Patient presents for Hypertension and Diabetes  Patient here to follow-up chronic medical problems and medications.  She is a previous patient of my PA who is left the practice. History reviewed in detail.  Hypertension-no history of coronary artery disease or stroke.  Diagnosed 3 years ago Taking lisinopril 10 mg once a day , BP range 118-142/70-90,    Diabetes mellitus-metformin at thousand milligrams twice a day as well as Januvia 100 mg daily. Diagnosed about 2-3 years ago.     She is on statin drug simvastatin 10 mg at bedtime.  Fasting CBGs she rarely takes- her random numbers from Nov and Jan range 92-209 fasting, this AM 169 A1c 7% in July, DL was 60  Asthma-current preventative medications needed. She has triggers of allergies and exercise in the past. Has never used albuterol   Generalized anxiety/ Panic -noted in the history but not an active problem no current medications.  Acid reflux Prilosec 40 mg daily  Family has severe reactions to flu vaccine    History of multiple colon polyps- has colonscopy in 3 years   She has had zostavax   History of elevated LFT for past 10-15 years had work up in the past   Right shoulder pain and into the elbow region- no swelling, if she lays on her right side she will have pain but more of an acheing sensation, able to to  Do her regular activities , no specific injury. Occ uses biofreeze   She admits does not want to take many medications   Review Of Systems:  GEN- denies fatigue, fever, weight loss,weakness, recent illness HEENT- denies eye drainage, change in vision, nasal discharge, CVS- denies chest pain, palpitations RESP- denies SOB, cough, wheeze ABD- denies N/V, change in stools, abd pain GU- denies dysuria, hematuria, dribbling, incontinence MSK-+ joint pain, muscle aches, injury Neuro- denies headache,  dizziness, syncope, seizure activity       Objective:    BP 130/80   Pulse 81   Temp 97.6 F (36.4 C) (Oral)   Resp 15   Ht 5\' 1"  (1.549 m)   Wt 175 lb 8 oz (79.6 kg)   SpO2 96%   BMI 33.16 kg/m  GEN- NAD, alert and oriented x3 HEENT- PERRL, EOMI, non injected sclera, pink conjunctiva, MMM, oropharynx clear Neck- Supple, no thyromegaly CVS- RRR, no murmur RESP-CTAB ABD-NABS,soft,NT,ND Psych- normal affect and mood  MSK- bilat shoulder arm, normal apperance, rotator cuff intact, biceps in tact, NT at epicondyle right elbow, no effusion,FROM Shoulder/elbow Psych- normal affect and mood  EXT- No edema Pulses- Radial, DP- 2+        Assessment & Plan:      Problem List Items Addressed This Visit      Unprioritized   Diabetes mellitus type 2, uncomplicated ([place])    We will recheck her A1c goal is less than 7%.  She does want to come off her medications again discussed dietary changes and weight loss.  Fasting blood sugar goal 80-1 20 advised that she can check it 3 times a week      Relevant Orders   Hemoglobin A1c   Essential hypertension - Primary    Blood pressure is fairly well controlled.  She does have some mild elevations at home.  Goal is to keep her blood pressure less than 130/80 discussed dietary changes that she can  make including some weight loss.      Relevant Orders   CBC with Differential/Platelet   Comprehensive metabolic panel   Hyperlipidemia    LDL did come to goal with addition of statin drug we will recheck her liver function test and lipid panel today.      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 Any transcriptional errors that result from this process  are unintentional.

## 2018-09-02 NOTE — Assessment & Plan Note (Signed)
We will recheck her A1c goal is less than 7%.  She does want to come off her medications again discussed dietary changes and weight loss.  Fasting blood sugar goal 80-1 20 advised that she can check it 3 times a week

## 2018-09-02 NOTE — Assessment & Plan Note (Signed)
Blood pressure is fairly well controlled.  She does have some mild elevations at home.  Goal is to keep her blood pressure less than 130/80 discussed dietary changes that she can make including some weight loss.

## 2018-09-02 NOTE — Patient Instructions (Addendum)
F/U 6 months for wellness exam  Fasting blood sugars goal between 80-120 Release of records- Previous PCP [place] ( Need colonoscopy shot records, labs) Goal Bp < 130/80

## 2018-09-02 NOTE — Assessment & Plan Note (Addendum)
LDL did come to goal with addition of statin drug we will recheck her liver function test and lipid panel today.

## 2018-09-03 LAB — COMPREHENSIVE METABOLIC PANEL
AG RATIO: 2 (calc) (ref 1.0–2.5)
ALT: 76 U/L — AB (ref 6–29)
AST: 36 U/L — AB (ref 10–35)
Albumin: 4.5 g/dL (ref 3.6–5.1)
Alkaline phosphatase (APISO): 78 U/L (ref 33–130)
BUN: 18 mg/dL (ref 7–25)
CALCIUM: 10.3 mg/dL (ref 8.6–10.4)
CO2: 29 mmol/L (ref 20–32)
CREATININE: 0.73 mg/dL (ref 0.50–0.99)
Chloride: 101 mmol/L (ref 98–110)
GLOBULIN: 2.2 g/dL (ref 1.9–3.7)
GLUCOSE: 155 mg/dL — AB (ref 65–99)
Potassium: 4.9 mmol/L (ref 3.5–5.3)
SODIUM: 139 mmol/L (ref 135–146)
TOTAL PROTEIN: 6.7 g/dL (ref 6.1–8.1)
Total Bilirubin: 0.5 mg/dL (ref 0.2–1.2)

## 2018-09-03 LAB — CBC WITH DIFFERENTIAL/PLATELET
Absolute Monocytes: 392 cells/uL (ref 200–950)
BASOS PCT: 0.9 %
Basophils Absolute: 95 cells/uL (ref 0–200)
EOS PCT: 3.1 %
Eosinophils Absolute: 329 cells/uL (ref 15–500)
HCT: 42.8 % (ref 35.0–45.0)
HEMOGLOBIN: 14.8 g/dL (ref 11.7–15.5)
Lymphs Abs: 2798 cells/uL (ref 850–3900)
MCH: 32 pg (ref 27.0–33.0)
MCHC: 34.6 g/dL (ref 32.0–36.0)
MCV: 92.6 fL (ref 80.0–100.0)
MONOS PCT: 3.7 %
MPV: 9.3 fL (ref 7.5–12.5)
NEUTROS ABS: 6985 {cells}/uL (ref 1500–7800)
Neutrophils Relative %: 65.9 %
PLATELETS: 575 10*3/uL — AB (ref 140–400)
RBC: 4.62 10*6/uL (ref 3.80–5.10)
RDW: 12.1 % (ref 11.0–15.0)
TOTAL LYMPHOCYTE: 26.4 %
WBC: 10.6 10*3/uL (ref 3.8–10.8)

## 2018-09-03 LAB — LIPID PANEL
Cholesterol: 128 mg/dL (ref <200)
HDL: 31 mg/dL — AB (ref >50)
LDL Cholesterol (Calc): 71 mg/dL (calc)
Non-HDL Cholesterol (Calc): 97 mg/dL (calc) (ref <130)
TRIGLYCERIDES: 185 mg/dL — AB (ref <150)
Total CHOL/HDL Ratio: 4.1 (calc) (ref <5.0)

## 2018-09-03 LAB — HEMOGLOBIN A1C
Hgb A1c MFr Bld: 8.4 % of total Hgb — ABNORMAL HIGH (ref <5.7)
Mean Plasma Glucose: 194 (calc)
eAG (mmol/L): 10.8 (calc)

## 2018-09-07 ENCOUNTER — Other Ambulatory Visit: Payer: Self-pay | Admitting: *Deleted

## 2018-09-07 DIAGNOSIS — E119 Type 2 diabetes mellitus without complications: Secondary | ICD-10-CM

## 2018-09-07 DIAGNOSIS — E785 Hyperlipidemia, unspecified: Secondary | ICD-10-CM

## 2018-09-07 DIAGNOSIS — I10 Essential (primary) hypertension: Secondary | ICD-10-CM

## 2018-09-07 DIAGNOSIS — G8929 Other chronic pain: Secondary | ICD-10-CM

## 2018-09-07 DIAGNOSIS — M25511 Pain in right shoulder: Secondary | ICD-10-CM

## 2018-09-07 MED ORDER — DAPAGLIFLOZIN PROPANEDIOL 5 MG PO TABS: 5.0000 mg | ORAL_TABLET | Freq: Every day | ORAL | 1 refills | Status: DC

## 2018-12-05 ENCOUNTER — Other Ambulatory Visit: Payer: Self-pay

## 2018-12-05 ENCOUNTER — Other Ambulatory Visit: Payer: Federal, State, Local not specified - PPO

## 2018-12-05 DIAGNOSIS — I10 Essential (primary) hypertension: Secondary | ICD-10-CM

## 2018-12-05 DIAGNOSIS — E785 Hyperlipidemia, unspecified: Secondary | ICD-10-CM

## 2018-12-05 DIAGNOSIS — G8929 Other chronic pain: Secondary | ICD-10-CM

## 2018-12-05 DIAGNOSIS — E119 Type 2 diabetes mellitus without complications: Secondary | ICD-10-CM | POA: Diagnosis not present

## 2018-12-05 DIAGNOSIS — M25511 Pain in right shoulder: Secondary | ICD-10-CM

## 2018-12-06 LAB — CBC WITH DIFFERENTIAL/PLATELET
Absolute Monocytes: 407 cells/uL (ref 200–950)
Basophils Absolute: 87 cells/uL (ref 0–200)
Basophils Relative: 0.9 %
Eosinophils Absolute: 340 cells/uL (ref 15–500)
Eosinophils Relative: 3.5 %
HCT: 45.2 % — ABNORMAL HIGH (ref 35.0–45.0)
Hemoglobin: 15 g/dL (ref 11.7–15.5)
Lymphs Abs: 2784 cells/uL (ref 850–3900)
MCH: 31.3 pg (ref 27.0–33.0)
MCHC: 33.2 g/dL (ref 32.0–36.0)
MCV: 94.2 fL (ref 80.0–100.0)
MPV: 9.6 fL (ref 7.5–12.5)
Monocytes Relative: 4.2 %
Neutro Abs: 6082 cells/uL (ref 1500–7800)
Neutrophils Relative %: 62.7 %
Platelets: 531 10*3/uL — ABNORMAL HIGH (ref 140–400)
RBC: 4.8 10*6/uL (ref 3.80–5.10)
RDW: 12.1 % (ref 11.0–15.0)
Total Lymphocyte: 28.7 %
WBC: 9.7 10*3/uL (ref 3.8–10.8)

## 2018-12-06 LAB — COMPLETE METABOLIC PANEL WITH GFR
AG Ratio: 2 (calc) (ref 1.0–2.5)
ALT: 26 U/L (ref 6–29)
AST: 16 U/L (ref 10–35)
Albumin: 4.5 g/dL (ref 3.6–5.1)
Alkaline phosphatase (APISO): 73 U/L (ref 37–153)
BUN: 25 mg/dL (ref 7–25)
CO2: 28 mmol/L (ref 20–32)
Calcium: 9.9 mg/dL (ref 8.6–10.4)
Chloride: 102 mmol/L (ref 98–110)
Creat: 0.61 mg/dL (ref 0.50–0.99)
GFR, Est African American: 112 mL/min/{1.73_m2} (ref >=60)
GFR, Est Non African American: 96 mL/min/{1.73_m2} (ref >=60)
Globulin: 2.3 g/dL (calc) (ref 1.9–3.7)
Glucose, Bld: 114 mg/dL — ABNORMAL HIGH (ref 65–99)
Potassium: 5.3 mmol/L (ref 3.5–5.3)
Sodium: 139 mmol/L (ref 135–146)
Total Bilirubin: 0.5 mg/dL (ref 0.2–1.2)
Total Protein: 6.8 g/dL (ref 6.1–8.1)

## 2018-12-06 LAB — MICROALBUMIN / CREATININE URINE RATIO
Creatinine, Urine: 76 mg/dL (ref 20–275)
Microalb Creat Ratio: 8 mcg/mg creat (ref <30)
Microalb, Ur: 0.6 mg/dL

## 2018-12-06 LAB — LIPID PANEL
Cholesterol: 119 mg/dL (ref <200)
HDL: 28 mg/dL — ABNORMAL LOW (ref >=50)
LDL Cholesterol (Calc): 64 mg/dL (calc)
Non-HDL Cholesterol (Calc): 91 mg/dL (calc) (ref <130)
Total CHOL/HDL Ratio: 4.3 (calc) (ref <5.0)
Triglycerides: 194 mg/dL — ABNORMAL HIGH (ref <150)

## 2018-12-06 LAB — HEMOGLOBIN A1C
Hgb A1c MFr Bld: 6.5 % of total Hgb — ABNORMAL HIGH (ref <5.7)
Mean Plasma Glucose: 140 (calc)
eAG (mmol/L): 7.7 (calc)

## 2018-12-18 NOTE — Progress Notes (Signed)
[facility] Note  12/19/2018     CHIEF COMPLAINT Patient presents for Retina Follow Up   HISTORY OF PRESENT ILLNESS: Jeanne Shaw is a 63 y.o. female who presents to the clinic today for:   HPI    Retina Follow Up    Patient presents with  Dry AMD.  In both eyes.  This started 13 months ago.  Severity is mild.  Duration of 4 months.  Since onset it is stable.  I, the attending physician,  performed the HPI with the patient and updated documentation appropriately.          Comments    63 y/o female pt here for 4 mo f/u for non-exu ARMD OU.  No change in [place] OU.  Denies pain, flashes, floaters.  No gtts.  BS this a.m. 118.  A1C 6.5.       Last edited by Bernarda Caffey, MD on 12/19/2018  9:41 AM. (History)    pt states her vision is doing well, she states she has not had any issues since her last visit  Referring physician: Orlena Sheldon, PA-C No address on file  HISTORICAL INFORMATION:   Selected notes from the [place] Referred by M. Dixon, PA-C for DM exam;  LEE-  Ocular Hx-  PMH- DM (A1C 7.4 on 04.01.19), HTN, asthma,     CURRENT MEDICATIONS: No current outpatient medications on file. (Ophthalmic Drugs)   No current facility-administered medications for this visit.  (Ophthalmic Drugs)   Current Outpatient Medications (Other)  Medication Sig  . aspirin EC 81 MG tablet Take 81 mg by mouth daily.  . cetirizine (ZYRTEC) 5 MG tablet Take 1 tablet (5 mg total) by mouth daily.  . cholecalciferol (VITAMIN D) 400 units TABS tablet Take 400 Units by mouth daily.  . dapagliflozin propanediol (FARXIGA) 5 MG TABS tablet Take 5 mg by mouth daily.  Marland Kitchen lisinopril (PRINIVIL,ZESTRIL) 10 MG tablet Take 1 tablet (10 mg total) by mouth daily.  . metFORMIN (GLUCOPHAGE) 1000 MG tablet Take 1 tablet (1,000 mg total) by mouth 2 (two) times daily with a meal.  . metroNIDAZOLE (METROGEL) 0.75 % gel Apply 1 application topically daily.  Marland Kitchen  MILK THISTLE PO Take by mouth.  . Multiple Vitamins-Minerals (PRESERVISION AREDS) CAPS   . omeprazole (PRILOSEC) 40 MG capsule Take 1 capsule (40 mg total) by mouth daily.  . simvastatin (ZOCOR) 10 MG tablet Take 1 tablet (10 mg total) by mouth at bedtime.  . sitaGLIPtin (JANUVIA) 100 MG tablet Take 1 tablet (100 mg total) by mouth daily.   No current facility-administered medications for this visit.  (Other)      REVIEW OF SYSTEMS: ROS    Positive for: Endocrine, Eyes   Negative for: Constitutional, Gastrointestinal, Neurological, Skin, Genitourinary, Musculoskeletal, HENT, Cardiovascular, Respiratory, Psychiatric, Allergic/Imm, Heme/Lymph   Last edited by Matthew Folks, COA on 12/19/2018  8:53 AM. (History)       ALLERGIES Allergies  Allergen Reactions  . Hydrocodone Anxiety    PAST MEDICAL HISTORY Past Medical History:  Diagnosis Date  . Allergy   . Anxiety   . Asthma   . Cataract    OU  . Diabetes mellitus without complication ([place])   . GERD (gastroesophageal reflux disease)   . Hypertension   . Macular degeneration    Dry OU   Past Surgical History:  Procedure Laterality Date  . CYST EXCISION     off vocal cord  FAMILY HISTORY Family History  Problem Relation Age of Onset  . COPD Mother   . Alcohol abuse Father   . Diabetes Father   . Heart disease Father   . Hyperlipidemia Father   . Hypertension Father   . Cancer Sister   . Diabetes Sister   . Hyperlipidemia Sister   . Hypertension Sister   . Breast cancer Sister 61  . Hypertension Brother   . Alcohol abuse Maternal Aunt   . Alcohol abuse Maternal Uncle   . Hypertension Maternal Uncle   . Alcohol abuse Paternal Aunt   . Cancer Paternal Aunt   . Alcohol abuse Paternal Uncle   . Cancer Paternal Uncle   . Diabetes Maternal Grandmother   . Stroke Maternal Grandmother   . Heart disease Maternal Grandfather     SOCIAL HISTORY Social History   Tobacco Use  . Smoking status: Former  Smoker    Last attempt to quit: 08/03/2015    Years since quitting: 3.3  . Smokeless tobacco: Former Network engineer Use Topics  . Alcohol use: Yes    Comment: 1 glass wine daily, beer occassional  . Drug use: No         OPHTHALMIC EXAM:  Base Eye Exam    Visual Acuity (Snellen - Linear)      Right Left   Dist cc 20/15 -2 20/20 +2   Correction:  Glasses       Tonometry (Tonopen, 8:56 AM)      Right Left   Pressure 12 13       Pupils      Dark Light Shape React APD   Right 3 2 Round Brisk None   Left 3 2 Round Brisk None       Visual Fields (Counting fingers)      Left Right    Full Full       Extraocular Movement      Right Left    Full, Ortho Full, Ortho       Neuro/Psych    Oriented x3:  Yes   Mood/Affect:  Normal       Dilation    Both eyes:  1.0% Mydriacyl, 2.5% Phenylephrine @ 8:56 AM        Slit Lamp and Fundus Exam    Slit Lamp Exam      Right Left   Lids/Lashes Dermatochalasis - upper lid Dermatochalasis - upper lid   Conjunctiva/Sclera mild Conjunctivochalasis IT, nasal Pinguecula mild Conjunctivochalasis IT   Cornea 1+ Punctate epithelial erosions, trace endopigment 1+ Punctate epithelial erosions, trace endopigment   Anterior Chamber Deep and quiet Deep and quiet   Iris Round and dilated, No NVI Round and dilated, No NVI   Lens 2+ Nuclear sclerosis, 2+ Cortical cataract 2+ Nuclear sclerosis, 2+ Cortical cataract   Vitreous Mild Vitreous syneresis Mild Vitreous syneresis       Fundus Exam      Right Left   Disc Pink and Sharp Pink and Sharp   C/D Ratio 0.3 0.3   Macula flat, blunted foveal reflex, mild RPE mottling and clumping, focal PED superonasal to fovea, No heme or edema Flat, Blunted foveal reflex, drusen, mild RPE mottling and clumping, +PEDs, No heme or edema   Vessels Vascular attenuation, Tortuous, AV crossing changes, Copper wiring Mild Vascular attenuation, Tortuous   Periphery Attached, No heme  Attached, patch of  cobblestoning at 0600, No heme           IMAGING AND PROCEDURES  Imaging and Procedures for 11/16/17  OCT, Retina - OU - Both Eyes       Right Eye Quality was good. Central Foveal Thickness: 298. Progression has been stable. Findings include normal foveal contour, no IRF, no SRF, pigment epithelial detachment (Focal PED nasal to fovea stable from prior).   Left Eye Quality was good. Central Foveal Thickness: 290. Progression has been stable. Findings include normal foveal contour, no IRF, no SRF, pigment epithelial detachment, intraretinal hyper-reflective material, retinal drusen  (Stable PED with +hyper-reflective material -- stable from prior).   Notes *Images captured and stored on drive  Diagnosis / Impression: NFP, No IRF/SRF OU Drusen and PEDs OS>OD No DME OU   Clinical management:  See below  Abbreviations: NFP - Normal foveal profile. CME - cystoid macular edema. PED - pigment epithelial detachment. IRF - intraretinal fluid. SRF - subretinal fluid. EZ - ellipsoid zone. ERM - epiretinal membrane. ORA - outer retinal atrophy. ORT - outer retinal tubulation. SRHM - subretinal hyper-reflective material                  ASSESSMENT/PLAN:    ICD-10-CM   1. Intermediate stage nonexudative age-related macular degeneration of both eyes H35.3132   2. Diabetes mellitus type 2 without retinopathy ([place]) E11.9   3. Retinal edema H35.81 OCT, Retina - OU - Both Eyes  4. Combined forms of age-related cataract of both eyes H25.813   5. Early dry stage nonexudative age-related macular degeneration of both eyes H35.3131     1. Non-Exudative ARMD OU-   - scattered central drusen and PEDs OU (OS > OD)  - OCT Stable PED with +hyper-reflective material OS  - The incidence, anatomy, and pathology of dry AMD, risk of progression, and the AREDS and AREDS 2 study including smoking risks discussed with patient.  - continue amsler grid monitoring  - f/u 4-6 months for repeat  DFE/OCT  2. Diabetes mellitus, type 2 without retinopathy  - The incidence, risk factors for progression, natural history and treatment options for diabetic retinopathy  were discussed with patient.    - The need for close monitoring of blood glucose, blood pressure, and serum lipids, avoiding cigarette or any type of tobacco, and the need for long term follow up was also discussed with patient.  - f/u in 1 year, sooner prn   3. No retinal edema on exam or OCT  4. Combined form age-related cataract OU-  - The symptoms of cataract, surgical options, and treatments and risks were discussed with patient.  - discussed diagnosis and progression  - not yet visually significant  - monitor for now   Ophthalmic Meds Ordered this visit:  No orders of the defined types were placed in this encounter.      Return for f/u 4-6 months, non exu ARMD OU, DFE, OCT.  There are no Patient Instructions on file for this visit.   Explained the diagnoses, plan, and follow up with the patient and they expressed understanding.  Patient expressed understanding of the importance of proper follow up care.   This document serves as a record of services personally performed by Gardiner Sleeper, MD, PhD. It was created on their behalf by Ernest Mallick, OA, an ophthalmic assistant. The creation of this record is the provider's dictation and/or activities during the visit.    Electronically signed by: Ernest Mallick, OA 05.17.2020 12:10 PM     Gardiner Sleeper, M.D., Ph.D. Diseases & Surgery of the Retina and Vitreous [facility]  I have reviewed the above documentation for accuracy and completeness, and I agree with the above. Gardiner Sleeper, M.D., Ph.D. 12/19/18 12:11 PM    Abbreviations: M myopia (nearsighted); A astigmatism; H hyperopia (farsighted); P presbyopia; Mrx spectacle prescription;  CTL contact lenses; OD right eye; OS left eye; OU both eyes  XT exotropia; ET esotropia; PEK  punctate epithelial keratitis; PEE punctate epithelial erosions; DES dry eye syndrome; MGD meibomian gland dysfunction; ATs artificial tears; PFAT's preservative free artificial tears; [place] nuclear sclerotic cataract; PSC posterior subcapsular cataract; ERM epi-retinal membrane; PVD posterior vitreous detachment; RD retinal detachment; DM diabetes mellitus; DR diabetic retinopathy; NPDR non-proliferative diabetic retinopathy; PDR proliferative diabetic retinopathy; CSME clinically significant macular edema; DME diabetic macular edema; dbh dot blot hemorrhages; CWS cotton wool spot; POAG primary open angle glaucoma; C/D cup-to-disc ratio; HVF humphrey visual field; GVF goldmann visual field; OCT optical coherence tomography; IOP intraocular pressure; BRVO Branch retinal vein occlusion; CRVO central retinal vein occlusion; CRAO central retinal artery occlusion; BRAO branch retinal artery occlusion; RT retinal tear; SB scleral buckle; PPV pars plana vitrectomy; VH Vitreous hemorrhage; PRP panretinal laser photocoagulation; IVK intravitreal kenalog; VMT vitreomacular traction; MH Macular hole;  NVD neovascularization of the disc; NVE neovascularization elsewhere; AREDS age related eye disease study; ARMD age related macular degeneration; POAG primary open angle glaucoma; EBMD epithelial/anterior basement membrane dystrophy; ACIOL anterior chamber intraocular lens; IOL intraocular lens; PCIOL posterior chamber intraocular lens; Phaco/IOL phacoemulsification with intraocular lens placement; [place] photorefractive keratectomy; LASIK laser assisted in situ keratomileusis; HTN hypertension; DM diabetes mellitus; COPD chronic obstructive pulmonary disease

## 2018-12-19 ENCOUNTER — Other Ambulatory Visit: Payer: Self-pay

## 2018-12-19 ENCOUNTER — Ambulatory Visit (INDEPENDENT_AMBULATORY_CARE_PROVIDER_SITE_OTHER): Payer: Federal, State, Local not specified - PPO | Admitting: Ophthalmology

## 2018-12-19 ENCOUNTER — Encounter (INDEPENDENT_AMBULATORY_CARE_PROVIDER_SITE_OTHER): Payer: Self-pay | Admitting: Ophthalmology

## 2018-12-19 DIAGNOSIS — H3581 Retinal edema: Secondary | ICD-10-CM

## 2018-12-19 DIAGNOSIS — E119 Type 2 diabetes mellitus without complications: Secondary | ICD-10-CM | POA: Diagnosis not present

## 2018-12-19 DIAGNOSIS — H25813 Combined forms of age-related cataract, bilateral: Secondary | ICD-10-CM

## 2018-12-19 DIAGNOSIS — H353131 Nonexudative age-related macular degeneration, bilateral, early dry stage: Secondary | ICD-10-CM

## 2018-12-19 DIAGNOSIS — H353132 Nonexudative age-related macular degeneration, bilateral, intermediate dry stage: Secondary | ICD-10-CM

## 2019-03-06 ENCOUNTER — Other Ambulatory Visit: Payer: Self-pay

## 2019-03-07 ENCOUNTER — Encounter: Payer: Self-pay | Admitting: Family Medicine

## 2019-03-07 ENCOUNTER — Ambulatory Visit (INDEPENDENT_AMBULATORY_CARE_PROVIDER_SITE_OTHER): Payer: Federal, State, Local not specified - PPO | Admitting: Family Medicine

## 2019-03-07 VITALS — BP 124/60 | HR 84 | Temp 97.9°F | Resp 14 | Ht 61.0 in | Wt 160.0 lb

## 2019-03-07 DIAGNOSIS — Z124 Encounter for screening for malignant neoplasm of cervix: Secondary | ICD-10-CM | POA: Diagnosis not present

## 2019-03-07 DIAGNOSIS — E669 Obesity, unspecified: Secondary | ICD-10-CM

## 2019-03-07 DIAGNOSIS — Z0001 Encounter for general adult medical examination with abnormal findings: Secondary | ICD-10-CM

## 2019-03-07 DIAGNOSIS — E785 Hyperlipidemia, unspecified: Secondary | ICD-10-CM

## 2019-03-07 DIAGNOSIS — I10 Essential (primary) hypertension: Secondary | ICD-10-CM | POA: Diagnosis not present

## 2019-03-07 DIAGNOSIS — Z1239 Encounter for other screening for malignant neoplasm of breast: Secondary | ICD-10-CM

## 2019-03-07 DIAGNOSIS — E119 Type 2 diabetes mellitus without complications: Secondary | ICD-10-CM | POA: Diagnosis not present

## 2019-03-07 DIAGNOSIS — Z23 Encounter for immunization: Secondary | ICD-10-CM

## 2019-03-07 DIAGNOSIS — Z0000 Encounter for general adult medical examination without abnormal findings: Secondary | ICD-10-CM

## 2019-03-07 DIAGNOSIS — E66811 Obesity, class 1: Secondary | ICD-10-CM

## 2019-03-07 MED ORDER — METFORMIN HCL 1000 MG PO TABS: 1000.0000 mg | ORAL_TABLET | Freq: Two times a day (BID) | ORAL | 2 refills | Status: DC

## 2019-03-07 MED ORDER — SIMVASTATIN 10 MG PO TABS: 10.0000 mg | ORAL_TABLET | Freq: Every day | ORAL | 2 refills | Status: DC

## 2019-03-07 MED ORDER — SITAGLIPTIN PHOSPHATE 100 MG PO TABS: 100.0000 mg | ORAL_TABLET | Freq: Every day | ORAL | 2 refills | Status: DC

## 2019-03-07 MED ORDER — LISINOPRIL 10 MG PO TABS: 10.0000 mg | ORAL_TABLET | Freq: Every day | ORAL | 2 refills | Status: DC

## 2019-03-07 NOTE — Patient Instructions (Addendum)
F/U 6 months  TDAP given We will call with results

## 2019-03-07 NOTE — Assessment & Plan Note (Signed)
She has had very minimal fluctuations in her blood sugar.  She would like to come off of her [place].  We will check her A1c if it is less than 7% we will go ahead and discontinue the [place] and see how she does.  She has lost 15 pounds intentionally trying to come off her medications.

## 2019-03-07 NOTE — Assessment & Plan Note (Signed)
Blood pressure is controlled no change in medication. 

## 2019-03-07 NOTE — Progress Notes (Signed)
Subjective:    Patient ID: Jeanne Shaw, female    DOB: 1955/11/16, 63 y.o.   MRN: [id number]  Patient presents for Gynecologic Exam (is fasting)  Pt here for CPE, medications reviewed  HTN- taking lisinopril  114-120'/ 70-80's  DM- Last A1C 6.5%,  Taking [place] 100mg  daily, farxiga 5mg  and MTF 1000mg  BID,   On ZOCOR 10mg  at bedtime    cbg 117-130's while off of [place] which she did in May.  He was worried about the side effects which is why she stopped the medication for a month.  She then restarted in June since then has noted a few elevated blood sugars high as 164 but most have ranged from 10 1-140,     Eye visit- Dr. Bernarda Caffey  Taking Vitamin D and Vitamin C daily / taking MVI   Due for PAP Smear   GERD- taking prilosec 40mg  once a day    Mammogram Due- last May 2019, due in  2021   Colonoscopy- done Feb 2018 in [place], due very 3 years  She has lost 15lbs since Jan  Chigger bites a few weeks ago they are now healed  Immunizations- TDAP   PNEUMONIA - DECLINES  Shingles UTD   Review Of Systems:  GEN- denies fatigue, fever, weight loss,weakness, recent illness HEENT- denies eye drainage, change in vision, nasal discharge, CVS- denies chest pain, palpitations RESP- denies SOB, cough, wheeze ABD- denies N/V, change in stools, abd pain GU- denies dysuria, hematuria, dribbling, incontinence MSK- denies joint pain, muscle aches, injury Neuro- denies headache, dizziness, syncope, seizure activity       Objective:    BP 124/60   Pulse 84   Temp 97.9 F (36.6 C) (Oral)   Resp 14   Ht 5\' 1"  (1.549 m)   Wt 160 lb (72.6 kg)   SpO2 96%   BMI 30.23 kg/m  GEN- NAD, alert and oriented x3 HEENT- PERRL, EOMI, non injected sclera, pink conjunctiva, MMM, oropharynx clear Neck- Supple, no thyromegaly Breast- normal symmetry, no nipple inversion,no nipple drainage, no nodules or lumps felt Nodes- no axillary nodes CVS- RRR, no  murmur RESP-CTAB ABD-NABS,soft,NT,ND GU- normal external genitalia, vaginal atrophy, cervix visualized no growth, no blood form os, minimal thin clear discharge, no CMT, no ovarian masses, uterus normal size Retum- normal wink, external tags EXT- No edema Pulses- Radial, DP- 2+        Assessment & Plan:     FALL/ADUIT C/Depression screen negative   Problem List Items Addressed This Visit      Unprioritized   Breast cancer screening   Relevant Orders   MM 3D SCREEN BREAST BILATERAL   Diabetes mellitus type 2, uncomplicated ([place])    She has had very minimal fluctuations in her blood sugar.  She would like to come off of her [place].  We will check her A1c if it is less than 7% we will go ahead and discontinue the [place] and see how she does.  She has lost 15 pounds intentionally trying to come off her medications.      Relevant Orders   CBC with Differential/Platelet   Comprehensive metabolic panel   Hemoglobin A1c   Lipid panel   HM DIABETES FOOT EXAM (Completed)   Essential hypertension    Blood pressure is controlled no change in medication.      Hyperlipidemia   Relevant Orders   Lipid panel   Obesity (BMI 30.0-34.9)    Other Visit Diagnoses  Routine general medical examination at a health care facility    -  Primary   CPE done, plan mammogram every 2 years   Relevant Orders   Tdap vaccine greater than or equal to 7yo IM (Completed)   Cervical cancer screening       Relevant Orders   Pap IG w/ reflex to HPV when ASC-U   Need for tetanus, diphtheria, and acellular pertussis (Tdap) vaccine in patient of adolescent age or older       Relevant Orders   Tdap vaccine greater than or equal to 7yo IM (Completed)      Note: This dictation was prepared with Dragon dictation along with smaller Company secretary. Any transcriptional errors that result from this process are unintentional.

## 2019-03-08 LAB — PAP IG W/ RFLX HPV ASCU

## 2019-03-08 LAB — COMPREHENSIVE METABOLIC PANEL
AG Ratio: 1.7 (calc) (ref 1.0–2.5)
ALT: 30 U/L — ABNORMAL HIGH (ref 6–29)
AST: 19 U/L (ref 10–35)
Albumin: 4.3 g/dL (ref 3.6–5.1)
Alkaline phosphatase (APISO): 83 U/L (ref 37–153)
BUN: 17 mg/dL (ref 7–25)
CO2: 25 mmol/L (ref 20–32)
Calcium: 9.9 mg/dL (ref 8.6–10.4)
Chloride: 104 mmol/L (ref 98–110)
Creat: 0.66 mg/dL (ref 0.50–0.99)
Globulin: 2.6 g/dL (calc) (ref 1.9–3.7)
Glucose, Bld: 113 mg/dL — ABNORMAL HIGH (ref 65–99)
Potassium: 4.6 mmol/L (ref 3.5–5.3)
Sodium: 140 mmol/L (ref 135–146)
Total Bilirubin: 0.4 mg/dL (ref 0.2–1.2)
Total Protein: 6.9 g/dL (ref 6.1–8.1)

## 2019-03-08 LAB — CBC WITH DIFFERENTIAL/PLATELET
Absolute Monocytes: 396 cells/uL (ref 200–950)
Basophils Absolute: 108 cells/uL (ref 0–200)
Basophils Relative: 1.2 %
Eosinophils Absolute: 252 cells/uL (ref 15–500)
Eosinophils Relative: 2.8 %
HCT: 45.8 % — ABNORMAL HIGH (ref 35.0–45.0)
Hemoglobin: 15 g/dL (ref 11.7–15.5)
Lymphs Abs: 2250 cells/uL (ref 850–3900)
MCH: 31.1 pg (ref 27.0–33.0)
MCHC: 32.8 g/dL (ref 32.0–36.0)
MCV: 94.8 fL (ref 80.0–100.0)
MPV: 9.3 fL (ref 7.5–12.5)
Monocytes Relative: 4.4 %
Neutro Abs: 5994 cells/uL (ref 1500–7800)
Neutrophils Relative %: 66.6 %
Platelets: 533 10*3/uL — ABNORMAL HIGH (ref 140–400)
RBC: 4.83 10*6/uL (ref 3.80–5.10)
RDW: 13.1 % (ref 11.0–15.0)
Total Lymphocyte: 25 %
WBC: 9 10*3/uL (ref 3.8–10.8)

## 2019-03-08 LAB — HEMOGLOBIN A1C
Hgb A1c MFr Bld: 6.4 % of total Hgb — ABNORMAL HIGH (ref <5.7)
Mean Plasma Glucose: 137 (calc)
eAG (mmol/L): 7.6 (calc)

## 2019-03-08 LAB — LIPID PANEL
Cholesterol: 131 mg/dL (ref <200)
HDL: 35 mg/dL — ABNORMAL LOW (ref >=50)
LDL Cholesterol (Calc): 68 mg/dL (calc)
Non-HDL Cholesterol (Calc): 96 mg/dL (calc) (ref <130)
Total CHOL/HDL Ratio: 3.7 (calc) (ref <5.0)
Triglycerides: 216 mg/dL — ABNORMAL HIGH (ref <150)

## 2019-03-10 ENCOUNTER — Other Ambulatory Visit: Payer: Self-pay | Admitting: *Deleted

## 2019-03-10 MED ORDER — FISH OIL 1000 MG PO CAPS: 1.0000 | ORAL_CAPSULE | Freq: Every day | ORAL | 0 refills | Status: DC

## 2019-06-16 NOTE — Progress Notes (Signed)
[facility] Note  06/21/2019     CHIEF COMPLAINT Patient presents for Retina Follow Up   HISTORY OF PRESENT ILLNESS: Jeanne Shaw is a 63 y.o. female who presents to the clinic today for:   HPI    Retina Follow Up    Patient presents with  Dry AMD.  In both eyes.  This started 6 months ago.  Severity is moderate.  I, the attending physician,  performed the HPI with the patient and updated documentation appropriately.          Comments    Patient here for 6 month retina follow up for non exu ARMD OU. Patient states vision doing about the same. No eye pain.  Added over the counter fish oil and red yeast rice.       Last edited by Bernarda Caffey, MD on 06/21/2019  8:04 AM. (History)    pt states occasionally when she checks her vision on the amsler grid her left eye vision "blacks out" she states it has not happened the past couple of times she checked it, she is taking Preservision vitamins  Referring physician: Alycia Rossetti, MD [id number] [place],  [place] [address]  HISTORICAL INFORMATION:   Selected notes from the [facility] Referred by M. Dixon, PA-C for DM exam;  LEE-  Ocular Hx-  PMH- DM (A1C 7.4 on 04.01.19), HTN, asthma,     CURRENT MEDICATIONS: No current outpatient medications on file. (Ophthalmic Drugs)   No current facility-administered medications for this visit.  (Ophthalmic Drugs)   Current Outpatient Medications (Other)  Medication Sig  . Ascorbic Acid (VITAMIN C) 1000 MG tablet Take 1,000 mg by mouth daily.  Marland Kitchen aspirin EC 81 MG tablet Take 81 mg by mouth daily.  . Cholecalciferol (VITAMIN D) 125 MCG (5000 UT) CAPS Take by mouth.  Marland Kitchen lisinopril (ZESTRIL) 10 MG tablet Take 1 tablet (10 mg total) by mouth daily.  . metFORMIN (GLUCOPHAGE) 1000 MG tablet Take 1 tablet (1,000 mg total) by mouth 2 (two) times daily with a meal.  . MILK THISTLE PO Take by mouth.  . Multiple Vitamins-Minerals  (PRESERVISION AREDS) CAPS   . Omega-3 Fatty Acids (FISH OIL) 1000 MG CAPS Take 1 capsule (1,000 mg total) by mouth daily.  Marland Kitchen omeprazole (PRILOSEC) 40 MG capsule Take 1 capsule (40 mg total) by mouth daily.  . simvastatin (ZOCOR) 10 MG tablet Take 1 tablet (10 mg total) by mouth at bedtime.  . sitaGLIPtin (JANUVIA) 100 MG tablet Take 1 tablet (100 mg total) by mouth daily.   No current facility-administered medications for this visit.  (Other)      REVIEW OF SYSTEMS: ROS    Positive for: Endocrine, Cardiovascular, Eyes   Negative for: Constitutional, Gastrointestinal, Neurological, Skin, Genitourinary, Musculoskeletal, HENT, Respiratory, Psychiatric, Allergic/Imm, Heme/Lymph   Last edited by Theodore Demark, COA on 06/21/2019  7:59 AM. (History)       ALLERGIES Allergies  Allergen Reactions  . Hydrocodone Anxiety    PAST MEDICAL HISTORY Past Medical History:  Diagnosis Date  . Allergy   . Anxiety   . Asthma   . Cataract    OU  . Diabetes mellitus without complication ([place])   . GERD (gastroesophageal reflux disease)   . Hypertension   . Macular degeneration    Dry OU   Past Surgical History:  Procedure Laterality Date  . CYST EXCISION     off vocal cord  FAMILY HISTORY Family History  Problem Relation Age of Onset  . COPD Mother   . Alcohol abuse Father   . Diabetes Father   . Heart disease Father   . Hyperlipidemia Father   . Hypertension Father   . Cancer Sister   . Diabetes Sister   . Hyperlipidemia Sister   . Hypertension Sister   . Breast cancer Sister 8  . Hypertension Brother   . Alcohol abuse Maternal Aunt   . Alcohol abuse Maternal Uncle   . Hypertension Maternal Uncle   . Alcohol abuse Paternal Aunt   . Cancer Paternal Aunt   . Alcohol abuse Paternal Uncle   . Cancer Paternal Uncle   . Diabetes Maternal Grandmother   . Stroke Maternal Grandmother   . Heart disease Maternal Grandfather     SOCIAL HISTORY Social History   Tobacco  Use  . Smoking status: Former Smoker    Quit date: 08/03/2015    Years since quitting: 3.8  . Smokeless tobacco: Former Network engineer Use Topics  . Alcohol use: Yes    Comment: 1 glass wine daily, beer occassional  . Drug use: No         OPHTHALMIC EXAM:  Base Eye Exam    Visual Acuity (Snellen - Linear)      Right Left   Dist cc 20/20 20/20 -1   Correction: Glasses       Tonometry (Tonopen, 7:56 AM)      Right Left   Pressure 18 14       Pupils      Dark Light Shape React APD   Right 3 2 Round Brisk None   Left 3 2 Round Brisk None       Visual Fields (Counting fingers)      Left Right    Full Full       Extraocular Movement      Right Left    Full, Ortho Full, Ortho       Neuro/Psych    Oriented x3: Yes   Mood/Affect: Normal       Dilation    Both eyes: 1.0% Mydriacyl, 2.5% Phenylephrine @ 7:56 AM        Slit Lamp and Fundus Exam    Slit Lamp Exam      Right Left   Lids/Lashes Dermatochalasis - upper lid Dermatochalasis - upper lid   Conjunctiva/Sclera mild Conjunctivochalasis IT, nasal Pinguecula mild Conjunctivochalasis IT   Cornea 1+ Punctate epithelial erosions, trace endopigment 1+ Punctate epithelial erosions, trace endopigment   Anterior Chamber Deep and quiet Deep and quiet   Iris Round and dilated, No NVI Round and dilated, No NVI   Lens 2+ Nuclear sclerosis, 2+ Cortical cataract 2+ Nuclear sclerosis, 2+ Cortical cataract   Vitreous Mild Vitreous syneresis Mild Vitreous syneresis       Fundus Exam      Right Left   Disc Pink and Sharp Pink and Sharp   C/D Ratio 0.3 0.3   Macula flat, blunted foveal reflex, mild RPE mottling and clumping, focal druse superonasal to fovea, No heme or edema Flat, Blunted foveal reflex, drusen, mild RPE mottling and clumping, +PEDs, No heme or edema   Vessels Vascular attenuation, Tortuous, AV crossing changes, Copper wiring Mild Vascular attenuation, Tortuous   Periphery Attached, No heme  Attached,  patch of cobblestoning at 0600, No heme         Refraction    Wearing Rx      Sphere  Cylinder Axis Add   Right +1.00 +1.50 175 +2.25   Left +1.25 +1.25 018 +2.25   Type: PAL          IMAGING AND PROCEDURES  Imaging and Procedures for 11/16/17  OCT, Retina - OU - Both Eyes       Right Eye Quality was good. Central Foveal Thickness: 289. Progression has been stable. Findings include normal foveal contour, no IRF, no SRF, pigment epithelial detachment, outer retinal atrophy, subretinal hyper-reflective material, retinal drusen  (Focal PED/SRHM with overlying ORA -- improved from prior; prominent druse).   Left Eye Quality was good. Central Foveal Thickness: 297. Progression has been stable. Findings include normal foveal contour, no IRF, no SRF, pigment epithelial detachment, intraretinal hyper-reflective material, retinal drusen  (Scattered prominent drusen stable from prior).   Notes *Images captured and stored on drive  Diagnosis / Impression: NFP, No IRF/SRF OU Drusen and PEDs OS>OD No DME OU   Clinical management:  See below  Abbreviations: NFP - Normal foveal profile. CME - cystoid macular edema. PED - pigment epithelial detachment. IRF - intraretinal fluid. SRF - subretinal fluid. EZ - ellipsoid zone. ERM - epiretinal membrane. ORA - outer retinal atrophy. ORT - outer retinal tubulation. SRHM - subretinal hyper-reflective material                  ASSESSMENT/PLAN:    ICD-10-CM   1. Intermediate stage nonexudative age-related macular degeneration of both eyes  H35.3132   2. Diabetes mellitus type 2 without retinopathy ([place])  E11.9   3. Retinal edema  H35.81 OCT, Retina - OU - Both Eyes  4. Combined forms of age-related cataract of both eyes  H25.813   5. Early dry stage nonexudative age-related macular degeneration of both eyes  H35.3131     1. Non-Exudative ARMD OU-   - Intermediate stage  - scattered, prominent central drusen and PEDs OU (OS >  OD)  - OCT with prominent drusen, stable PED with +hyper reflective material OU  - The incidence, anatomy, and pathology of dry AMD, risk of progression, and the AREDS and AREDS 2 study including smoking risks discussed with patient.  - continue amsler grid monitoring  - f/u 6-9 months for repeat DFE/OCT  2. Diabetes mellitus, type 2 without retinopathy  - The incidence, risk factors for progression, natural history and treatment options for diabetic retinopathy  were discussed with patient.    - The need for close monitoring of blood glucose, blood pressure, and serum lipids, avoiding cigarette or any type of tobacco, and the need for long term follow up was also discussed with patient.  - f/u in 1 year, sooner prn   3. No retinal edema on exam or OCT  4. Combined form age-related cataract OU-  - The symptoms of cataract, surgical options, and treatments and risks were discussed with patient.  - discussed diagnosis and progression  - not yet visually significant  - monitor for now   Ophthalmic Meds Ordered this visit:  No orders of the defined types were placed in this encounter.      Return for f/u 6-9 months non-exu ARMD OU, DFE, OCT.  There are no Patient Instructions on file for this visit.   Explained the diagnoses, plan, and follow up with the patient and they expressed understanding.  Patient expressed understanding of the importance of proper follow up care.   This document serves as a record of services personally performed by Gardiner Sleeper, MD, PhD. It  was created on their behalf by Roselee Nova, COMT. The creation of this record is the provider's dictation and/or activities during the visit.  Electronically signed by: Roselee Nova, COMT 06/21/19 8:44 AM   This document serves as a record of services personally performed by Gardiner Sleeper, MD, PhD. It was created on their behalf by Ernest Mallick, OA, an ophthalmic assistant. The creation of this record is the  provider's dictation and/or activities during the visit.    Electronically signed by: Ernest Mallick, OA 11.18.2020 8:44 AM   Gardiner Sleeper, M.D., Ph.D. Diseases & Surgery of the Retina and [place] 06/21/2019   I have reviewed the above documentation for accuracy and completeness, and I agree with the above. Gardiner Sleeper, M.D., Ph.D. 06/21/19 8:44 AM    Abbreviations: M myopia (nearsighted); A astigmatism; H hyperopia (farsighted); P presbyopia; Mrx spectacle prescription;  CTL contact lenses; OD right eye; OS left eye; OU both eyes  XT exotropia; ET esotropia; PEK punctate epithelial keratitis; PEE punctate epithelial erosions; DES dry eye syndrome; MGD meibomian gland dysfunction; ATs artificial tears; PFAT's preservative free artificial tears; [place] nuclear sclerotic cataract; PSC posterior subcapsular cataract; ERM epi-retinal membrane; PVD posterior vitreous detachment; RD retinal detachment; DM diabetes mellitus; DR diabetic retinopathy; NPDR non-proliferative diabetic retinopathy; PDR proliferative diabetic retinopathy; CSME clinically significant macular edema; DME diabetic macular edema; dbh dot blot hemorrhages; CWS cotton wool spot; POAG primary open angle glaucoma; C/D cup-to-disc ratio; HVF humphrey visual field; GVF goldmann visual field; OCT optical coherence tomography; IOP intraocular pressure; BRVO Branch retinal vein occlusion; CRVO central retinal vein occlusion; CRAO central retinal artery occlusion; BRAO branch retinal artery occlusion; RT retinal tear; SB scleral buckle; PPV pars plana vitrectomy; VH Vitreous hemorrhage; PRP panretinal laser photocoagulation; IVK intravitreal kenalog; VMT vitreomacular traction; MH Macular hole;  NVD neovascularization of the disc; NVE neovascularization elsewhere; AREDS age related eye disease study; ARMD age related macular degeneration; POAG primary open angle glaucoma; EBMD epithelial/anterior basement  membrane dystrophy; ACIOL anterior chamber intraocular lens; IOL intraocular lens; PCIOL posterior chamber intraocular lens; Phaco/IOL phacoemulsification with intraocular lens placement; [place] photorefractive keratectomy; LASIK laser assisted in situ keratomileusis; HTN hypertension; DM diabetes mellitus; COPD chronic obstructive pulmonary disease

## 2019-06-21 ENCOUNTER — Encounter (INDEPENDENT_AMBULATORY_CARE_PROVIDER_SITE_OTHER): Payer: Self-pay | Admitting: Ophthalmology

## 2019-06-21 ENCOUNTER — Ambulatory Visit (INDEPENDENT_AMBULATORY_CARE_PROVIDER_SITE_OTHER): Payer: Federal, State, Local not specified - PPO | Admitting: Ophthalmology

## 2019-06-21 DIAGNOSIS — H353132 Nonexudative age-related macular degeneration, bilateral, intermediate dry stage: Secondary | ICD-10-CM | POA: Diagnosis not present

## 2019-06-21 DIAGNOSIS — H3581 Retinal edema: Secondary | ICD-10-CM | POA: Diagnosis not present

## 2019-06-21 DIAGNOSIS — H25813 Combined forms of age-related cataract, bilateral: Secondary | ICD-10-CM

## 2019-06-21 DIAGNOSIS — H353131 Nonexudative age-related macular degeneration, bilateral, early dry stage: Secondary | ICD-10-CM

## 2019-06-21 DIAGNOSIS — E119 Type 2 diabetes mellitus without complications: Secondary | ICD-10-CM

## 2019-07-31 ENCOUNTER — Ambulatory Visit (HOSPITAL_COMMUNITY)
Admission: RE | Admit: 2019-07-31 | Discharge: 2019-07-31 | Disposition: A | Discharge status: Home / Self Care | Payer: Federal, State, Local not specified - PPO | Source: Ambulatory Visit | Attending: Family Medicine | Admitting: Family Medicine

## 2019-07-31 ENCOUNTER — Other Ambulatory Visit: Payer: Self-pay

## 2019-07-31 DIAGNOSIS — Z1231 Encounter for screening mammogram for malignant neoplasm of breast: Secondary | ICD-10-CM | POA: Diagnosis not present

## 2019-07-31 DIAGNOSIS — Z1239 Encounter for other screening for malignant neoplasm of breast: Secondary | ICD-10-CM

## 2019-09-08 ENCOUNTER — Other Ambulatory Visit: Payer: Self-pay

## 2019-09-08 ENCOUNTER — Ambulatory Visit: Payer: Federal, State, Local not specified - PPO | Admitting: Family Medicine

## 2019-09-08 ENCOUNTER — Encounter: Payer: Self-pay | Admitting: Family Medicine

## 2019-09-08 VITALS — BP 130/80 | HR 79 | Temp 97.6°F | Resp 16 | Ht 61.0 in | Wt 170.0 lb

## 2019-09-08 DIAGNOSIS — E669 Obesity, unspecified: Secondary | ICD-10-CM | POA: Diagnosis not present

## 2019-09-08 DIAGNOSIS — E785 Hyperlipidemia, unspecified: Secondary | ICD-10-CM

## 2019-09-08 DIAGNOSIS — I10 Essential (primary) hypertension: Secondary | ICD-10-CM

## 2019-09-08 DIAGNOSIS — E119 Type 2 diabetes mellitus without complications: Secondary | ICD-10-CM | POA: Diagnosis not present

## 2019-09-08 MED ORDER — FARXIGA 5 MG PO TABS: 5.0000 mg | ORAL_TABLET | Freq: Every day | ORAL | 6 refills | Status: DC

## 2019-09-08 NOTE — Progress Notes (Signed)
   Subjective:    Patient ID: Jeanne Shaw, female    DOB: [birth year], 64 y.o.   MRN: [id number]  Patient presents for Diabetes Patient here to follow-up diabetes mellitus.  Medications reviewed.  Diabetes mellitus last A1c 6.4%, she was taken off of Farxiga at her last visit in August 2020.  She had lost 15 pounds intentionally to help control her diabetes. She is still on januvia and metformin 100mg  BID   cbg first 2 months after stopping farxiga < 120 average, past 3 months 130-160, highest 186 Her weight is back up 10 pounds as well.  She has not been exercising like she was before. No hypoglycemia symptoms  Hypertension-  she is currently taking lisinopril 10 mg daily 120-130/60-70's  Hyperlipidemia-currently taking simvastatin 10 mg at bedtime without any difficulty, Her TG were elevated at last visit  At 216  LDL was 68   she did add fish oil and red yeast rice  , also started flaxseed   Supplements reviewed Review Of Systems:  GEN- denies fatigue, fever, weight loss,weakness, recent illness HEENT- denies eye drainage, change in vision, nasal discharge, CVS- denies chest pain, palpitations RESP- denies SOB, cough, wheeze ABD- denies N/V, change in stools, abd pain GU- denies dysuria, hematuria, dribbling, incontinence MSK- denies joint pain, muscle aches, injury Neuro- denies headache, dizziness, syncope, seizure activity       Objective:    BP 130/80   Pulse 79   Temp 97.6 F (36.4 C) (Temporal)   Resp 16   Ht 5\' 1"  (1.549 m)   Wt 170 lb (77.1 kg)   SpO2 95%   BMI 32.12 kg/m  GEN- NAD, alert and oriented x3 HEENT- PERRL, EOMI, non injected sclera, pink conjunctiva  Neck- Supple, no thyromegaly CVS- RRR, no murmur RESP-CTAB ABD-NABS,soft,NT,ND EXT- No edema Pulses- Radial, DP- 2+        Assessment & Plan:      Problem List Items Addressed This Visit      Unprioritized   Diabetes mellitus type 2, uncomplicated (HCC)    Weight is back up  10lbs her fasting blood sugars have been steadily going up over the past few months.  Now her average is about 150 fasting.  I think she would benefit from going back on the Farxiga due to the cardiovascular risk factors as well as the weight loss and blood sugar control.  We will restart her at 5 mg.  She will continue the Metformin and the Januvia.  I am going to check her labs today.      Relevant Medications   dapagliflozin propanediol (FARXIGA) 5 MG TABS tablet   Other Relevant Orders   Hemoglobin A1c   Essential hypertension - Primary    Controlled no changes       Relevant Orders   CBC with Differential/Platelet   Comprehensive metabolic panel   Hyperlipidemia    Continue statin drug as well as fish oil red yeast rice flaxseed.      Relevant Orders   Lipid panel   Obesity (BMI 30.0-34.9)    Discussed  incorporating exercise 3 times a week to start with.  She does have a treadmill at home she can walk for 10 to 15 minutes.         Note: This dictation was prepared with Dragon dictation along with smaller phrase technology. Any transcriptional errors that result from this process are unintentional.

## 2019-09-08 NOTE — Assessment & Plan Note (Signed)
Continue statin drug as well as fish oil red yeast rice flaxseed.

## 2019-09-08 NOTE — Patient Instructions (Signed)
We will cal with lab results  F/U 4 months

## 2019-09-08 NOTE — Assessment & Plan Note (Signed)
Weight is back up 10lbs her fasting blood sugars have been steadily going up over the past few months.  Now her average is about 150 fasting.  I think she would benefit from going back on the Farxiga due to the cardiovascular risk factors as well as the weight loss and blood sugar control.  We will restart her at 5 mg.  She will continue the Metformin and the Januvia.  I am going to check her labs today.

## 2019-09-08 NOTE — Assessment & Plan Note (Signed)
Controlled no changes 

## 2019-09-08 NOTE — Assessment & Plan Note (Signed)
Discussed  incorporating exercise 3 times a week to start with.  She does have a treadmill at home she can walk for 10 to 15 minutes.

## 2019-09-09 LAB — COMPREHENSIVE METABOLIC PANEL
AG Ratio: 2 (calc) (ref 1.0–2.5)
ALT: 60 U/L — ABNORMAL HIGH (ref 6–29)
AST: 28 U/L (ref 10–35)
Albumin: 4.5 g/dL (ref 3.6–5.1)
Alkaline phosphatase (APISO): 86 U/L (ref 37–153)
BUN: 17 mg/dL (ref 7–25)
CO2: 27 mmol/L (ref 20–32)
Calcium: 10.1 mg/dL (ref 8.6–10.4)
Chloride: 105 mmol/L (ref 98–110)
Creat: 0.68 mg/dL (ref 0.50–0.99)
Globulin: 2.2 g/dL (calc) (ref 1.9–3.7)
Glucose, Bld: 158 mg/dL — ABNORMAL HIGH (ref 65–99)
Potassium: 5.4 mmol/L — ABNORMAL HIGH (ref 3.5–5.3)
Sodium: 140 mmol/L (ref 135–146)
Total Bilirubin: 0.5 mg/dL (ref 0.2–1.2)
Total Protein: 6.7 g/dL (ref 6.1–8.1)

## 2019-09-09 LAB — CBC WITH DIFFERENTIAL/PLATELET
Absolute Monocytes: 451 cells/uL (ref 200–950)
Basophils Absolute: 68 cells/uL (ref 0–200)
Basophils Relative: 0.8 %
Eosinophils Absolute: 306 cells/uL (ref 15–500)
Eosinophils Relative: 3.6 %
HCT: 43.2 % (ref 35.0–45.0)
Hemoglobin: 14.4 g/dL (ref 11.7–15.5)
Lymphs Abs: 2499 cells/uL (ref 850–3900)
MCH: 31.9 pg (ref 27.0–33.0)
MCHC: 33.3 g/dL (ref 32.0–36.0)
MCV: 95.6 fL (ref 80.0–100.0)
MPV: 9.4 fL (ref 7.5–12.5)
Monocytes Relative: 5.3 %
Neutro Abs: 5177 cells/uL (ref 1500–7800)
Neutrophils Relative %: 60.9 %
Platelets: 516 10*3/uL — ABNORMAL HIGH (ref 140–400)
RBC: 4.52 10*6/uL (ref 3.80–5.10)
RDW: 12 % (ref 11.0–15.0)
Total Lymphocyte: 29.4 %
WBC: 8.5 10*3/uL (ref 3.8–10.8)

## 2019-09-09 LAB — LIPID PANEL
Cholesterol: 134 mg/dL (ref <200)
HDL: 34 mg/dL — ABNORMAL LOW (ref >=50)
LDL Cholesterol (Calc): 74 mg/dL (calc)
Non-HDL Cholesterol (Calc): 100 mg/dL (calc) (ref <130)
Total CHOL/HDL Ratio: 3.9 (calc) (ref <5.0)
Triglycerides: 165 mg/dL — ABNORMAL HIGH (ref <150)

## 2019-09-09 LAB — HEMOGLOBIN A1C
Hgb A1c MFr Bld: 6.9 % of total Hgb — ABNORMAL HIGH (ref <5.7)
Mean Plasma Glucose: 151 (calc)
eAG (mmol/L): 8.4 (calc)

## 2019-10-11 ENCOUNTER — Other Ambulatory Visit: Payer: Self-pay | Admitting: Family Medicine

## 2019-12-18 ENCOUNTER — Encounter: Payer: Self-pay | Admitting: Family Medicine

## 2019-12-18 ENCOUNTER — Other Ambulatory Visit: Payer: Self-pay | Admitting: Family Medicine

## 2019-12-18 MED ORDER — OMEPRAZOLE 40 MG PO CPDR: 40.0000 mg | DELAYED_RELEASE_CAPSULE | Freq: Every day | ORAL | 0 refills | Status: DC

## 2020-01-08 ENCOUNTER — Ambulatory Visit: Payer: Federal, State, Local not specified - PPO | Admitting: Family Medicine

## 2020-01-19 ENCOUNTER — Ambulatory Visit (INDEPENDENT_AMBULATORY_CARE_PROVIDER_SITE_OTHER): Payer: Federal, State, Local not specified - PPO | Admitting: Family Medicine

## 2020-01-19 ENCOUNTER — Other Ambulatory Visit: Payer: Self-pay

## 2020-01-19 ENCOUNTER — Encounter: Payer: Self-pay | Admitting: Family Medicine

## 2020-01-19 VITALS — BP 126/70 | HR 72 | Temp 98.4°F | Resp 14 | Ht 61.0 in | Wt 172.0 lb

## 2020-01-19 DIAGNOSIS — Z1211 Encounter for screening for malignant neoplasm of colon: Secondary | ICD-10-CM

## 2020-01-19 DIAGNOSIS — E119 Type 2 diabetes mellitus without complications: Secondary | ICD-10-CM | POA: Diagnosis not present

## 2020-01-19 DIAGNOSIS — Z86010 Personal history of colonic polyps: Secondary | ICD-10-CM | POA: Diagnosis not present

## 2020-01-19 DIAGNOSIS — E785 Hyperlipidemia, unspecified: Secondary | ICD-10-CM

## 2020-01-19 DIAGNOSIS — I10 Essential (primary) hypertension: Secondary | ICD-10-CM

## 2020-01-19 DIAGNOSIS — E669 Obesity, unspecified: Secondary | ICD-10-CM

## 2020-01-19 NOTE — Progress Notes (Signed)
   Subjective:    Patient ID: Jeanne Shaw, female    DOB: [birth year], 64 y.o.   MRN: [id number]  Patient presents for Follow-up (is fasting)  Patient here to follow-up on medical problems.  Medications reviewed  Diabetes mellitus last A1c 6.9% in FEB,  At the last visit Farxiga5mg  was restarted in setting of elevated sugars and weight gain Continued on MTF and Januvia  No hypoglycemia symptoms CBG are still up and down  , highest  150's fasting, lowest 130's  She has wine 8 ounces every night  SHe is trying to avoid Soda Eats white bread 2-3 times a week She does prepare most of her meals at home    Hypertension-  she is currently taking lisinopril 10 mg daily 120-130/60-70's  Hyperlipidemia-currently taking simvastatin 10 mg at bedtime without any difficulty, Her TG were elevated at last visit  At 165  LDL was 74in Feb   Mild elevated ALT at 60 Also uses red yeast Rice/ flaxseed/   [facility]   Due for colonoscopy- history of Polyps     No new concerns      Review Of Systems:  GEN- denies fatigue, fever, weight loss,weakness, recent illness HEENT- denies eye drainage, change in vision, nasal discharge, CVS- denies chest pain, palpitations RESP- denies SOB, cough, wheeze ABD- denies N/V, change in stools, abd pain GU- denies dysuria, hematuria, dribbling, incontinence MSK- denies joint pain, muscle aches, injury Neuro- denies headache, dizziness, syncope, seizure activity       Objective:    BP 126/70   Pulse 72   Temp 98.4 F (36.9 C) (Temporal)   Resp 14   Ht 5\' 1"  (1.549 m)   Wt 172 lb (78 kg)   SpO2 97%   BMI 32.50 kg/m  GEN- NAD, alert and oriented x3 HEENT- PERRL, EOMI, non injected sclera, pink conjunctiva, MMM, oropharynx clear Neck- Supple, no thyromegaly CVS- RRR, no murmur RESP-CTAB ABD-NABS,soft,NT,ND EXT- No edema Pulses- Radial, DP- 2+        Assessment & Plan:      Problem List Items Addressed  This Visit      Unprioritized   Diabetes mellitus type 2, uncomplicated (HCC)    Goal A1C less than 7% Discussed reducing sugars in diet Limiting processed foods Discussed foods with higher sugars        Relevant Orders   CBC with Differential/Platelet   Comprehensive metabolic panel   Lipid panel   Hemoglobin A1c   Microalbumin / creatinine urine ratio   Essential hypertension    Controlled  nochanges to meds       Hyperlipidemia    Mildly elevated ALT, I am concerned this is MTF, likley due to lipids- TG and her use of wine She states LFT have been elevated in the past, she had GI work-up which was benign Recommend she reduce Wine to 4-6 ounes to start with      Relevant Orders   Lipid panel   Obesity (BMI 30.0-34.9)    Other Visit Diagnoses    Colon cancer screening    -  Primary   Relevant Orders   Ambulatory referral to Gastroenterology   History of colon polyps          Note: This dictation was prepared with Dragon dictation along with smaller phrase technology. Any transcriptional errors that result from this process are unintentional.

## 2020-01-19 NOTE — Assessment & Plan Note (Signed)
Controlled no changes to meds 

## 2020-01-19 NOTE — Assessment & Plan Note (Signed)
Goal A1C less than 7% Discussed reducing sugars in diet Limiting processed foods Discussed foods with higher sugars

## 2020-01-19 NOTE — Assessment & Plan Note (Addendum)
Mildly elevated ALT, I am concerned this is MTF, likley due to lipids- TG and her use of wine She states LFT have been elevated in the past, she had GI work-up which was benign Recommend she reduce Wine to 4-6 ounes to start with

## 2020-01-20 LAB — LIPID PANEL
Cholesterol: 121 mg/dL (ref <200)
HDL: 38 mg/dL — ABNORMAL LOW (ref >=50)
LDL Cholesterol (Calc): 60 mg/dL (calc)
Non-HDL Cholesterol (Calc): 83 mg/dL (calc) (ref <130)
Total CHOL/HDL Ratio: 3.2 (calc) (ref <5.0)
Triglycerides: 145 mg/dL (ref <150)

## 2020-01-20 LAB — CBC WITH DIFFERENTIAL/PLATELET
Absolute Monocytes: 442 cells/uL (ref 200–950)
Basophils Absolute: 110 cells/uL (ref 0–200)
Basophils Relative: 1.2 %
Eosinophils Absolute: 359 cells/uL (ref 15–500)
Eosinophils Relative: 3.9 %
HCT: 46.6 % — ABNORMAL HIGH (ref 35.0–45.0)
Hemoglobin: 15.4 g/dL (ref 11.7–15.5)
Lymphs Abs: 2613 cells/uL (ref 850–3900)
MCH: 31.5 pg (ref 27.0–33.0)
MCHC: 33 g/dL (ref 32.0–36.0)
MCV: 95.3 fL (ref 80.0–100.0)
MPV: 9.3 fL (ref 7.5–12.5)
Monocytes Relative: 4.8 %
Neutro Abs: 5676 cells/uL (ref 1500–7800)
Neutrophils Relative %: 61.7 %
Platelets: 484 10*3/uL — ABNORMAL HIGH (ref 140–400)
RBC: 4.89 10*6/uL (ref 3.80–5.10)
RDW: 12.6 % (ref 11.0–15.0)
Total Lymphocyte: 28.4 %
WBC: 9.2 10*3/uL (ref 3.8–10.8)

## 2020-01-20 LAB — MICROALBUMIN / CREATININE URINE RATIO
Creatinine, Urine: 44 mg/dL (ref 20–275)
Microalb Creat Ratio: 5 mcg/mg creat (ref <30)
Microalb, Ur: 0.2 mg/dL

## 2020-01-20 LAB — COMPREHENSIVE METABOLIC PANEL
AG Ratio: 1.9 (calc) (ref 1.0–2.5)
ALT: 63 U/L — ABNORMAL HIGH (ref 6–29)
AST: 29 U/L (ref 10–35)
Albumin: 4.3 g/dL (ref 3.6–5.1)
Alkaline phosphatase (APISO): 85 U/L (ref 37–153)
BUN: 19 mg/dL (ref 7–25)
CO2: 27 mmol/L (ref 20–32)
Calcium: 10.4 mg/dL (ref 8.6–10.4)
Chloride: 104 mmol/L (ref 98–110)
Creat: 0.72 mg/dL (ref 0.50–0.99)
Globulin: 2.3 g/dL (calc) (ref 1.9–3.7)
Glucose, Bld: 150 mg/dL — ABNORMAL HIGH (ref 65–99)
Potassium: 5.6 mmol/L — ABNORMAL HIGH (ref 3.5–5.3)
Sodium: 140 mmol/L (ref 135–146)
Total Bilirubin: 0.3 mg/dL (ref 0.2–1.2)
Total Protein: 6.6 g/dL (ref 6.1–8.1)

## 2020-01-20 LAB — HEMOGLOBIN A1C
Hgb A1c MFr Bld: 6.7 % of total Hgb — ABNORMAL HIGH (ref <5.7)
Mean Plasma Glucose: 146 (calc)
eAG (mmol/L): 8.1 (calc)

## 2020-01-22 ENCOUNTER — Other Ambulatory Visit: Payer: Self-pay | Admitting: *Deleted

## 2020-01-22 DIAGNOSIS — E875 Hyperkalemia: Secondary | ICD-10-CM

## 2020-01-24 ENCOUNTER — Encounter (INDEPENDENT_AMBULATORY_CARE_PROVIDER_SITE_OTHER): Payer: Self-pay | Admitting: *Deleted

## 2020-01-26 ENCOUNTER — Other Ambulatory Visit: Payer: Federal, State, Local not specified - PPO

## 2020-01-26 ENCOUNTER — Other Ambulatory Visit: Payer: Self-pay

## 2020-01-26 DIAGNOSIS — E875 Hyperkalemia: Secondary | ICD-10-CM

## 2020-01-27 LAB — BASIC METABOLIC PANEL
BUN: 15 mg/dL (ref 7–25)
CO2: 26 mmol/L (ref 20–32)
Calcium: 9.7 mg/dL (ref 8.6–10.4)
Chloride: 104 mmol/L (ref 98–110)
Creat: 0.68 mg/dL (ref 0.50–0.99)
Glucose, Bld: 180 mg/dL — ABNORMAL HIGH (ref 65–99)
Potassium: 5.1 mmol/L (ref 3.5–5.3)
Sodium: 140 mmol/L (ref 135–146)

## 2020-01-30 ENCOUNTER — Other Ambulatory Visit: Payer: Self-pay | Admitting: *Deleted

## 2020-01-30 DIAGNOSIS — E875 Hyperkalemia: Secondary | ICD-10-CM

## 2020-02-23 ENCOUNTER — Other Ambulatory Visit: Payer: Federal, State, Local not specified - PPO

## 2020-02-23 ENCOUNTER — Other Ambulatory Visit: Payer: Self-pay

## 2020-02-23 DIAGNOSIS — E875 Hyperkalemia: Secondary | ICD-10-CM

## 2020-02-24 LAB — BASIC METABOLIC PANEL
BUN: 19 mg/dL (ref 7–25)
CO2: 26 mmol/L (ref 20–32)
Calcium: 9.4 mg/dL (ref 8.6–10.4)
Chloride: 102 mmol/L (ref 98–110)
Creat: 0.64 mg/dL (ref 0.50–0.99)
Glucose, Bld: 135 mg/dL — ABNORMAL HIGH (ref 65–99)
Potassium: 4.9 mmol/L (ref 3.5–5.3)
Sodium: 138 mmol/L (ref 135–146)

## 2020-02-26 ENCOUNTER — Telehealth (INDEPENDENT_AMBULATORY_CARE_PROVIDER_SITE_OTHER): Payer: Self-pay | Admitting: *Deleted

## 2020-02-26 ENCOUNTER — Encounter (INDEPENDENT_AMBULATORY_CARE_PROVIDER_SITE_OTHER): Payer: Self-pay | Admitting: *Deleted

## 2020-02-26 ENCOUNTER — Encounter: Payer: Self-pay | Admitting: Family Medicine

## 2020-02-26 ENCOUNTER — Other Ambulatory Visit (INDEPENDENT_AMBULATORY_CARE_PROVIDER_SITE_OTHER): Payer: Self-pay | Admitting: *Deleted

## 2020-02-26 MED ORDER — PLENVU 140 G PO SOLR
1.0000 | Freq: Once | ORAL | 0 refills | Status: AC
Start: 2020-02-26 — End: 2020-02-26

## 2020-02-26 NOTE — Telephone Encounter (Signed)
Referring MD/PCP: Bloomer   Procedure: tcs w mac in room 1  Reason/Indication:  Hx polyps  Has patient had this procedure before?  Yes, 2018 [facility])  If so, when, by whom and where?    Is there a family history of colon cancer?  no  Who?  What age when diagnosed?    Is patient diabetic?   yes      Does patient have prosthetic heart valve or mechanical valve?  no  Do you have a pacemaker/defibrillator?  no  Has patient ever had endocarditis/atrial fibrillation? no  Does patient use oxygen? no  Has patient had joint replacement within last 12 months?  no  Is patient constipated or do they take laxatives? no  Does patient have a history of alcohol/drug use?  no  Is patient on blood thinner such as Coumadin, Plavix and/or Aspirin? yes  Medications: asa 81 mg daily, omeprazole 40 mg daily, red yeast rice bid, farxiga 5 mg daily, simvastatin 10 mg daily, vit c 1000 mg daily, flax seed 1200 mg daily, januvia 100 mg daily, lisinopril 10 mg daily, milk thistle daily, metformin 1000 mg bid, vit d3 daily  Allergies: hydrocodone  Medication Adjustment per Dr Rehman/Dr Jenetta Downer asa 2 days, don't take DM meds evening before & morning of  Procedure date & time: 03/26/20

## 2020-02-26 NOTE — Telephone Encounter (Signed)
Ok to schedule, can continue aspirin as usual (no need to stop)  Harvel Quale, MD Gastroenterology and Hepatology [facility]

## 2020-02-26 NOTE — Telephone Encounter (Signed)
Patient needs Plenvu (copay card) ° °

## 2020-02-28 ENCOUNTER — Other Ambulatory Visit (INDEPENDENT_AMBULATORY_CARE_PROVIDER_SITE_OTHER): Payer: Self-pay | Admitting: *Deleted

## 2020-02-28 DIAGNOSIS — Z86010 Personal history of colonic polyps: Secondary | ICD-10-CM

## 2020-03-06 ENCOUNTER — Encounter: Payer: Self-pay | Admitting: Family Medicine

## 2020-03-06 MED ORDER — DAPAGLIFLOZIN PROPANEDIOL 5 MG PO TABS: 5.0000 mg | ORAL_TABLET | Freq: Every day | ORAL | 3 refills | Status: DC

## 2020-03-12 ENCOUNTER — Encounter (HOSPITAL_COMMUNITY)
Admission: RE | Admit: 2020-03-12 | Discharge: 2020-03-12 | Disposition: A | Discharge status: Home / Self Care | Payer: Medicare Other | Source: Ambulatory Visit | Attending: Gastroenterology | Admitting: Gastroenterology

## 2020-03-12 ENCOUNTER — Other Ambulatory Visit: Payer: Self-pay

## 2020-03-18 NOTE — Progress Notes (Signed)
[facility] Note  03/20/2020     CHIEF COMPLAINT Patient presents for Retina Follow Up   HISTORY OF PRESENT ILLNESS: Jeanne Shaw is a 64 y.o. female who presents to the clinic today for:   HPI    Retina Follow Up    Patient presents with  Dry AMD.  In both eyes.  This started months ago.  Severity is mild.  Duration of 9 months.  Since onset it is stable.  I, the attending physician,  performed the HPI with the patient and updated documentation appropriately.          Comments    64 y/o female pt here for 9 mo f/u for non-exu ARMD OU.  No change in [place] OU.  A spot in the upper right hand corner of her [place]ler grid looks a little faded, but no distortion or blind spots.  Denies pain, FOL, floaters.  No gtts.  BS 151.  A1C 6.4 1 month ago.       Last edited by Bernarda Caffey, MD on 03/20/2020  8:48 AM. (History)    pt states vision is still doing well, she is taking AREDS 2 and checking her amsler grid, no new health concerns  Referring physician: Alycia Rossetti, MD [address] [place],  [place] [address]  HISTORICAL INFORMATION:   Selected notes from the [facility] Referred by M. Dixon, PA-C for DM exam Ocular Hx-  PMH- DM (A1C 7.4 on 04.01.19), HTN, asthma,     CURRENT MEDICATIONS: No current outpatient medications on file. (Ophthalmic Drugs)   No current facility-administered medications for this visit. (Ophthalmic Drugs)   Current Outpatient Medications (Other)  Medication Sig  . Ascorbic Acid (VITAMIN C) 1000 MG tablet Take 1,000 mg by mouth daily.  Marland Kitchen aspirin EC 81 MG tablet Take 81 mg by mouth daily.  . Cholecalciferol (VITAMIN D) 125 MCG (5000 UT) CAPS Take 5,000 Units by mouth daily.   . dapagliflozin propanediol (FARXIGA) 5 MG TABS tablet Take 1 tablet (5 mg total) by mouth daily before breakfast.  . Flaxseed, Linseed, (FLAX SEEDS PO) Take 1 tablet by mouth daily.   Marland Kitchen lisinopril (ZESTRIL) 10 MG tablet  Take 1 tablet (10 mg total) by mouth daily.  . metFORMIN (GLUCOPHAGE) 1000 MG tablet Take 1 tablet (1,000 mg total) by mouth 2 (two) times daily with a meal.  . MILK THISTLE PO Take 1 tablet by mouth at bedtime.   . Multiple Vitamins-Minerals (PRESERVISION AREDS) CAPS Take 1 capsule by mouth at bedtime.   . Omega-3 Fatty Acids (FISH OIL) 1000 MG CAPS Take 1 capsule (1,000 mg total) by mouth daily. (Patient taking differently: Take 1,000 mg by mouth daily. )  . omeprazole (PRILOSEC) 40 MG capsule Take 1 capsule (40 mg total) by mouth daily.  Marland Kitchen PLENVU 140 g SOLR Take by mouth.  . Red Yeast Rice Extract (RED YEAST RICE PO) Take 1 tablet by mouth daily.   . simvastatin (ZOCOR) 10 MG tablet Take 1 tablet (10 mg total) by mouth at bedtime.  . sitaGLIPtin (JANUVIA) 100 MG tablet Take 1 tablet (100 mg total) by mouth daily.  . sodium chloride (OCEAN) 0.65 % SOLN nasal spray Place 1 spray into both nostrils daily as needed for congestion.  . SPS 15 GM/60ML suspension Take by mouth.   No current facility-administered medications for this visit. (Other)      REVIEW OF SYSTEMS: ROS  Positive for: Gastrointestinal, Skin, Endocrine, Eyes, Respiratory   Negative for: Constitutional, Neurological, Genitourinary, Musculoskeletal, HENT, Cardiovascular, Psychiatric, Allergic/Imm, Heme/Lymph   Last edited by Matthew Folks, COA on 03/20/2020  8:01 AM. (History)       ALLERGIES Allergies  Allergen Reactions  . Hydrocodone Anxiety    PAST MEDICAL HISTORY Past Medical History:  Diagnosis Date  . Allergy   . Anxiety   . Asthma   . Cataract    OU  . Diabetes mellitus without complication ([place])   . GERD (gastroesophageal reflux disease)   . Hypertension   . Macular degeneration    Dry OU   Past Surgical History:  Procedure Laterality Date  . CYST EXCISION     off vocal cord     FAMILY HISTORY Family History  Problem Relation Age of Onset  . COPD Mother   . Alcohol abuse Father   .  Diabetes Father   . Heart disease Father   . Hyperlipidemia Father   . Hypertension Father   . Cancer Sister   . Diabetes Sister   . Hyperlipidemia Sister   . Hypertension Sister   . Breast cancer Sister 70  . Hypertension Brother   . Alcohol abuse Maternal Aunt   . Alcohol abuse Maternal Uncle   . Hypertension Maternal Uncle   . Alcohol abuse Paternal Aunt   . Cancer Paternal Aunt   . Alcohol abuse Paternal Uncle   . Cancer Paternal Uncle   . Diabetes Maternal Grandmother   . Stroke Maternal Grandmother   . Heart disease Maternal Grandfather     SOCIAL HISTORY Social History   Tobacco Use  . Smoking status: Former Smoker    Quit date: 08/03/2015    Years since quitting: 4.6  . Smokeless tobacco: Former Network engineer  . Vaping Use: Never used  Substance Use Topics  . Alcohol use: Yes    Comment: 1 glass wine daily, beer occassional  . Drug use: No         OPHTHALMIC EXAM:  Base Eye Exam    Visual Acuity (Snellen - Linear)      Right Left   Dist cc 20/15 -2 20/20 -2   Correction: Glasses       Tonometry (Tonopen, 8:03 AM)      Right Left   Pressure 12 13       Pupils      Dark Light Shape React APD   Right 3 2 Round Brisk None   Left 3 2 Round Brisk None       Visual Fields (Counting fingers)      Left Right    Full Full       Extraocular Movement      Right Left    Full, Ortho Full, Ortho       Neuro/Psych    Oriented x3: Yes   Mood/Affect: Normal       Dilation    Both eyes: 1.0% Mydriacyl, 2.5% Phenylephrine @ 8:03 AM        Slit Lamp and Fundus Exam    Slit Lamp Exam      Right Left   Lids/Lashes Dermatochalasis - upper lid Dermatochalasis - upper lid   Conjunctiva/Sclera nasal and temporal pinguecula nasal and temporal pinguecula   Cornea 2+ Punctate epithelial erosions, trace endopigment 1+ Punctate epithelial erosions, trace endopigment   Anterior Chamber Deep and quiet Deep and quiet   Iris Round and dilated, No NVI  Round and  dilated, No NVI   Lens 2-3+ Nuclear sclerosis, 2+ Cortical cataract 2-3+ Nuclear sclerosis, 2+ Cortical cataract   Vitreous Mild Vitreous syneresis Mild Vitreous syneresis       Fundus Exam      Right Left   Disc Pink and Sharp Pink and Sharp   C/D Ratio 0.3 0.2   Macula flat, blunted foveal reflex, mild RPE mottling and clumping, focal druse/pigment clump superonasal to fovea, No heme or edema Flat, Blunted foveal reflex, drusen, mild RPE mottling and clumping, +PEDs, No heme or edema   Vessels Vascular attenuation, mild tortuousity Vascular attenuation, Tortuous   Periphery Attached, focal peripheral drusen nasally, No heme  Attached, patch of pavingstone at 0600, No heme           IMAGING AND PROCEDURES  Imaging and Procedures for 11/16/17  OCT, Retina - OU - Both Eyes       Right Eye Quality was good. Central Foveal Thickness: 285. Progression has been stable. Findings include normal foveal contour, no IRF, no SRF, pigment epithelial detachment, outer retinal atrophy, subretinal hyper-reflective material, retinal drusen  (Focal PED/SRHM with overlying ORA -- improved from prior; prominent druse SN fovea).   Left Eye Quality was good. Central Foveal Thickness: 298. Progression has been stable. Findings include normal foveal contour, no IRF, no SRF, pigment epithelial detachment, intraretinal hyper-reflective material, retinal drusen  (Scattered prominent drusen -- stable from prior; prominent SRHM inferior fovea).   Notes *Images captured and stored on drive  Diagnosis / Impression: NFP, No IRF/SRF OU Drusen and PEDs OS>OD No DME OU   Clinical management:  See below  Abbreviations: NFP - Normal foveal profile. CME - cystoid macular edema. PED - pigment epithelial detachment. IRF - intraretinal fluid. SRF - subretinal fluid. EZ - ellipsoid zone. ERM - epiretinal membrane. ORA - outer retinal atrophy. ORT - outer retinal tubulation. SRHM - subretinal  hyper-reflective material                  ASSESSMENT/PLAN:    ICD-10-CM   1. Intermediate stage nonexudative age-related macular degeneration of both eyes  H35.3132   2. Diabetes mellitus type 2 without retinopathy ([place])  E11.9   3. Retinal edema  H35.81 OCT, Retina - OU - Both Eyes  4. Combined forms of age-related cataract of both eyes  H25.813     1. Non-Exudative ARMD OU-   - Intermediate stage (OS > OD)  - scattered, prominent central drusen and PEDs OU (OS > OD)  - OCT with prominent drusen, stable PED with +hyperreflective material OU -- ?mild progression OS  - The incidence, anatomy, and pathology of dry AMD, risk of progression, and the AREDS and AREDS 2 study including smoking risks discussed with patient.  - continue amsler grid monitoring  - f/u 6 months for repeat DFE/OCT  2. Diabetes mellitus, type 2 without retinopathy  - The incidence, risk factors for progression, natural history and treatment options for diabetic retinopathy  were discussed with patient.    - The need for close monitoring of blood glucose, blood pressure, and serum lipids, avoiding cigarette or any type of tobacco, and the need for long term follow up was also discussed with patient.  - f/u in 1 year, sooner prn   3. No retinal edema on exam or OCT  4. Combined form age-related cataract OU-  - The symptoms of cataract, surgical options, and treatments and risks were discussed with patient.  - discussed diagnosis and progression  -  not yet visually significant  - monitor for now   Ophthalmic Meds Ordered this visit:  No orders of the defined types were placed in this encounter.      Return in about 6 months (around 09/20/2020) for f/u non-exu ARMD OU, DFE, OCT.  There are no Patient Instructions on file for this visit.   Explained the diagnoses, plan, and follow up with the patient and they expressed understanding.  Patient expressed understanding of the importance of proper  follow up care.   This document serves as a record of services personally performed by Gardiner Sleeper, MD, PhD. It was created on their behalf by Roselee Nova, COMT. The creation of this record is the provider's dictation and/or activities during the visit.  Electronically signed by: Roselee Nova, COMT 03/20/20 11:42 PM   This document serves as a record of services personally performed by Gardiner Sleeper, MD, PhD. It was created on their behalf by San Jetty. Owens Shark, OA an ophthalmic technician. The creation of this record is the provider's dictation and/or activities during the visit.    Electronically signed by: San Jetty. Owens Shark, [place] 08.18.2021 11:42 PM   Gardiner Sleeper, M.D., Ph.D. Diseases & Surgery of the Retina and Vitreous [facility]  I have reviewed the above documentation for accuracy and completeness, and I agree with the above. Gardiner Sleeper, M.D., Ph.D. 03/20/20 11:42 PM    Abbreviations: M myopia (nearsighted); A astigmatism; H hyperopia (farsighted); P presbyopia; Mrx spectacle prescription;  CTL contact lenses; OD right eye; OS left eye; OU both eyes  XT exotropia; ET esotropia; PEK punctate epithelial keratitis; PEE punctate epithelial erosions; DES dry eye syndrome; MGD meibomian gland dysfunction; ATs artificial tears; PFAT's preservative free artificial tears; [place] nuclear sclerotic cataract; PSC posterior subcapsular cataract; ERM epi-retinal membrane; PVD posterior vitreous detachment; RD retinal detachment; DM diabetes mellitus; DR diabetic retinopathy; NPDR non-proliferative diabetic retinopathy; PDR proliferative diabetic retinopathy; CSME clinically significant macular edema; DME diabetic macular edema; dbh dot blot hemorrhages; CWS cotton wool spot; POAG primary open angle glaucoma; C/D cup-to-disc ratio; HVF humphrey visual field; GVF goldmann visual field; OCT optical coherence tomography; IOP intraocular pressure; BRVO Branch retinal vein  occlusion; CRVO central retinal vein occlusion; CRAO central retinal artery occlusion; BRAO branch retinal artery occlusion; RT retinal tear; SB scleral buckle; PPV pars plana vitrectomy; VH Vitreous hemorrhage; PRP panretinal laser photocoagulation; IVK intravitreal kenalog; VMT vitreomacular traction; MH Macular hole;  NVD neovascularization of the disc; NVE neovascularization elsewhere; AREDS age related eye disease study; ARMD age related macular degeneration; POAG primary open angle glaucoma; EBMD epithelial/anterior basement membrane dystrophy; ACIOL anterior chamber intraocular lens; IOL intraocular lens; PCIOL posterior chamber intraocular lens; Phaco/IOL phacoemulsification with intraocular lens placement; [place] photorefractive keratectomy; LASIK laser assisted in situ keratomileusis; HTN hypertension; DM diabetes mellitus; COPD chronic obstructive pulmonary disease

## 2020-03-20 ENCOUNTER — Other Ambulatory Visit: Payer: Self-pay

## 2020-03-20 ENCOUNTER — Encounter (INDEPENDENT_AMBULATORY_CARE_PROVIDER_SITE_OTHER): Payer: Self-pay | Admitting: Ophthalmology

## 2020-03-20 ENCOUNTER — Ambulatory Visit (INDEPENDENT_AMBULATORY_CARE_PROVIDER_SITE_OTHER): Payer: Medicare Other | Admitting: Ophthalmology

## 2020-03-20 DIAGNOSIS — H353132 Nonexudative age-related macular degeneration, bilateral, intermediate dry stage: Secondary | ICD-10-CM

## 2020-03-20 DIAGNOSIS — H3581 Retinal edema: Secondary | ICD-10-CM | POA: Diagnosis not present

## 2020-03-20 DIAGNOSIS — E119 Type 2 diabetes mellitus without complications: Secondary | ICD-10-CM

## 2020-03-20 DIAGNOSIS — H25813 Combined forms of age-related cataract, bilateral: Secondary | ICD-10-CM

## 2020-03-22 ENCOUNTER — Other Ambulatory Visit (HOSPITAL_COMMUNITY)
Admission: RE | Admit: 2020-03-22 | Discharge: 2020-03-22 | Disposition: A | Discharge status: Home / Self Care | Payer: Medicare Other | Source: Ambulatory Visit | Attending: Gastroenterology | Admitting: Gastroenterology

## 2020-03-22 ENCOUNTER — Other Ambulatory Visit: Payer: Self-pay

## 2020-03-22 DIAGNOSIS — Z20822 Contact with and (suspected) exposure to covid-19: Secondary | ICD-10-CM | POA: Diagnosis not present

## 2020-03-22 DIAGNOSIS — Z01812 Encounter for preprocedural laboratory examination: Secondary | ICD-10-CM | POA: Diagnosis present

## 2020-03-22 LAB — SARS CORONAVIRUS 2 (TAT 6-24 HRS): SARS Coronavirus 2: NEGATIVE

## 2020-03-26 ENCOUNTER — Ambulatory Visit (HOSPITAL_COMMUNITY): Payer: Medicare Other | Admitting: Certified Registered"

## 2020-03-26 ENCOUNTER — Encounter (HOSPITAL_COMMUNITY): Payer: Self-pay | Admitting: Gastroenterology

## 2020-03-26 ENCOUNTER — Ambulatory Visit (HOSPITAL_COMMUNITY)
Admission: RE | Admit: 2020-03-26 | Discharge: 2020-03-26 | Disposition: A | Discharge status: Home / Self Care | Payer: Medicare Other | Attending: Gastroenterology | Admitting: Gastroenterology

## 2020-03-26 ENCOUNTER — Other Ambulatory Visit: Payer: Self-pay

## 2020-03-26 ENCOUNTER — Encounter (HOSPITAL_COMMUNITY)
Admission: RE | Disposition: A | Discharge status: Home / Self Care | Payer: Self-pay | Source: Home / Self Care | Attending: Gastroenterology

## 2020-03-26 DIAGNOSIS — F419 Anxiety disorder, unspecified: Secondary | ICD-10-CM | POA: Insufficient documentation

## 2020-03-26 DIAGNOSIS — J45909 Unspecified asthma, uncomplicated: Secondary | ICD-10-CM | POA: Diagnosis not present

## 2020-03-26 DIAGNOSIS — Z8249 Family history of ischemic heart disease and other diseases of the circulatory system: Secondary | ICD-10-CM | POA: Diagnosis not present

## 2020-03-26 DIAGNOSIS — Z87891 Personal history of nicotine dependence: Secondary | ICD-10-CM | POA: Diagnosis not present

## 2020-03-26 DIAGNOSIS — Z811 Family history of alcohol abuse and dependence: Secondary | ICD-10-CM | POA: Insufficient documentation

## 2020-03-26 DIAGNOSIS — I10 Essential (primary) hypertension: Secondary | ICD-10-CM | POA: Insufficient documentation

## 2020-03-26 DIAGNOSIS — D123 Benign neoplasm of transverse colon: Secondary | ICD-10-CM | POA: Diagnosis not present

## 2020-03-26 DIAGNOSIS — Z825 Family history of asthma and other chronic lower respiratory diseases: Secondary | ICD-10-CM | POA: Insufficient documentation

## 2020-03-26 DIAGNOSIS — K219 Gastro-esophageal reflux disease without esophagitis: Secondary | ICD-10-CM | POA: Diagnosis not present

## 2020-03-26 DIAGNOSIS — Z885 Allergy status to narcotic agent status: Secondary | ICD-10-CM | POA: Insufficient documentation

## 2020-03-26 DIAGNOSIS — Z7984 Long term (current) use of oral hypoglycemic drugs: Secondary | ICD-10-CM | POA: Insufficient documentation

## 2020-03-26 DIAGNOSIS — Z833 Family history of diabetes mellitus: Secondary | ICD-10-CM | POA: Insufficient documentation

## 2020-03-26 DIAGNOSIS — Z809 Family history of malignant neoplasm, unspecified: Secondary | ICD-10-CM | POA: Diagnosis not present

## 2020-03-26 DIAGNOSIS — Z803 Family history of malignant neoplasm of breast: Secondary | ICD-10-CM | POA: Diagnosis not present

## 2020-03-26 DIAGNOSIS — Z801 Family history of malignant neoplasm of trachea, bronchus and lung: Secondary | ICD-10-CM | POA: Insufficient documentation

## 2020-03-26 DIAGNOSIS — D120 Benign neoplasm of cecum: Secondary | ICD-10-CM | POA: Diagnosis not present

## 2020-03-26 DIAGNOSIS — Z09 Encounter for follow-up examination after completed treatment for conditions other than malignant neoplasm: Secondary | ICD-10-CM

## 2020-03-26 DIAGNOSIS — D122 Benign neoplasm of ascending colon: Secondary | ICD-10-CM | POA: Insufficient documentation

## 2020-03-26 DIAGNOSIS — H353130 Nonexudative age-related macular degeneration, bilateral, stage unspecified: Secondary | ICD-10-CM | POA: Insufficient documentation

## 2020-03-26 DIAGNOSIS — Z8349 Family history of other endocrine, nutritional and metabolic diseases: Secondary | ICD-10-CM | POA: Insufficient documentation

## 2020-03-26 DIAGNOSIS — Z1211 Encounter for screening for malignant neoplasm of colon: Secondary | ICD-10-CM | POA: Insufficient documentation

## 2020-03-26 DIAGNOSIS — Z86010 Personal history of colonic polyps: Secondary | ICD-10-CM | POA: Diagnosis not present

## 2020-03-26 DIAGNOSIS — Z7982 Long term (current) use of aspirin: Secondary | ICD-10-CM | POA: Insufficient documentation

## 2020-03-26 DIAGNOSIS — E1136 Type 2 diabetes mellitus with diabetic cataract: Secondary | ICD-10-CM | POA: Insufficient documentation

## 2020-03-26 DIAGNOSIS — Z823 Family history of stroke: Secondary | ICD-10-CM | POA: Insufficient documentation

## 2020-03-26 DIAGNOSIS — Z8049 Family history of malignant neoplasm of other genital organs: Secondary | ICD-10-CM | POA: Insufficient documentation

## 2020-03-26 DIAGNOSIS — Z79899 Other long term (current) drug therapy: Secondary | ICD-10-CM | POA: Insufficient documentation

## 2020-03-26 HISTORY — PX: POLYPECTOMY: SHX5525

## 2020-03-26 HISTORY — PX: BIOPSY: SHX5522

## 2020-03-26 HISTORY — PX: COLONOSCOPY WITH PROPOFOL: SHX5780

## 2020-03-26 LAB — GLUCOSE, CAPILLARY: Glucose-Capillary: 165 mg/dL — ABNORMAL HIGH (ref 70–99)

## 2020-03-26 SURGERY — COLONOSCOPY WITH PROPOFOL
Anesthesia: General

## 2020-03-26 MED ORDER — CHLORHEXIDINE GLUCONATE CLOTH 2 % EX PADS: 6.0000 | MEDICATED_PAD | Freq: Once | CUTANEOUS | Status: DC

## 2020-03-26 MED ORDER — LIDOCAINE HCL (CARDIAC) PF 50 MG/5ML IV SOSY
PREFILLED_SYRINGE | INTRAVENOUS | Status: DC | PRN
  Administered 2020-03-26: 80 mg via INTRAVENOUS

## 2020-03-26 MED ORDER — PROPOFOL 500 MG/50ML IV EMUL
INTRAVENOUS | Status: DC | PRN
  Administered 2020-03-26: 90 mg via INTRAVENOUS
  Administered 2020-03-26: 150 ug/kg/min via INTRAVENOUS
  Administered 2020-03-26: 20 mg via INTRAVENOUS

## 2020-03-26 MED ORDER — LACTATED RINGERS IV SOLN: INTRAVENOUS | Status: DC

## 2020-03-26 MED ORDER — STERILE WATER FOR IRRIGATION IR SOLN
Status: DC | PRN
  Administered 2020-03-26: 1.5 mL

## 2020-03-26 NOTE — Discharge Instructions (Signed)
Colonoscopy, Adult, Care After This sheet gives you information about how to care for yourself after your procedure. Your doctor may also give you more specific instructions. If you have problems or questions, call your doctor. What can I expect after the procedure? After the procedure, it is common to have:  A small amount of blood in your poop (stool) for 24 hours.  Some gas.  Mild cramping or bloating in your belly (abdomen). Follow these instructions at home: Eating and drinking   Drink enough fluid to keep your pee (urine) pale yellow.  Follow instructions from your doctor about what you cannot eat or drink.  Return to your normal diet as told by your doctor. Avoid heavy or fried foods that are hard to digest. Activity  Rest as told by your doctor.  Do not sit for a long time without moving. Get up to take short walks every 1-2 hours. This is important. Ask for help if you feel weak or unsteady.  Return to your normal activities as told by your doctor. Ask your doctor what activities are safe for you. To help cramping and bloating:   Try walking around.  Put heat on your belly as told by your doctor. Use the heat source that your doctor recommends, such as a moist heat pack or a heating pad. ? Put a towel between your skin and the heat source. ? Leave the heat on for 20-30 minutes. ? Remove the heat if your skin turns bright red. This is very important if you are unable to feel pain, heat, or cold. You may have a greater risk of getting burned. General instructions  For the first 24 hours after the procedure: ? Do not drive or use machinery. ? Do not sign important documents. ? Do not drink alcohol. ? Do your daily activities more slowly than normal. ? Eat foods that are soft and easy to digest.  Take over-the-counter or prescription medicines only as told by your doctor.  Keep all follow-up visits as told by your doctor. This is important. Contact a doctor  if:  You have blood in your poop 2-3 days after the procedure. Get help right away if:  You have more than a small amount of blood in your poop.  You see large clumps of tissue (blood clots) in your poop.  Your belly is swollen.  You feel like you may vomit (nauseous).  You vomit.  You have a fever.  You have belly pain that gets worse, and medicine does not help your pain. Summary  After the procedure, it is common to have a small amount of blood in your poop. You may also have mild cramping and bloating in your belly.  For the first 24 hours after the procedure, do not drive or use machinery, do not sign important documents, and do not drink alcohol.  Get help right away if you have a lot of blood in your poop, feel like you may vomit, have a fever, or have more belly pain. This information is not intended to replace advice given to you by your health care provider. Make sure you discuss any questions you have with your health care provider. Document Revised: 02/13/2019 Document Reviewed: 02/13/2019 Elsevier Patient Education  [place]. Colon Polyps  Polyps are tissue growths inside the body. Polyps can grow in many places, including the large intestine (colon). A polyp may be a round bump or a mushroom-shaped growth. You could have one polyp or several. Most  colon polyps are noncancerous (benign). However, some colon polyps can become cancerous over time. Finding and removing the polyps early can help prevent this. What are the causes? The exact cause of colon polyps is not known. What increases the risk? You are more likely to develop this condition if you:  Have a family history of colon cancer or colon polyps.  Are older than 10 or older than 45 if you are African American.  Have inflammatory bowel disease, such as ulcerative colitis or Crohn's disease.  Have certain hereditary conditions, such as: ? Familial adenomatous polyposis. ? Lynch  syndrome. ? Turcot syndrome. ? Peutz-Jeghers syndrome.  Are overweight.  Smoke cigarettes.  Do not get enough exercise.  Drink too much alcohol.  Eat a diet that is high in fat and red meat and low in fiber.  Had childhood cancer that was treated with abdominal radiation. What are the signs or symptoms? Most polyps do not cause symptoms. If you have symptoms, they may include:  Blood coming from your rectum when having a bowel movement.  Blood in your stool. The stool may look dark red or black.  Abdominal pain.  A change in bowel habits, such as constipation or diarrhea. How is this diagnosed? This condition is diagnosed with a colonoscopy. This is a procedure in which a lighted, flexible scope is inserted into the anus and then passed into the colon to examine the area. Polyps are sometimes found when a colonoscopy is done as part of routine cancer screening tests. How is this treated? Treatment for this condition involves removing any polyps that are found. Most polyps can be removed during a colonoscopy. Those polyps will then be tested for cancer. Additional treatment may be needed depending on the results of testing. Follow these instructions at home: Lifestyle  Maintain a healthy weight, or lose weight if recommended by your health care provider.  Exercise every day or as told by your health care provider.  Do not use any products that contain nicotine or tobacco, such as cigarettes and e-cigarettes. If you need help quitting, ask your health care provider.  If you drink alcohol, limit how much you have: ? 0-1 drink a day for women. ? 0-2 drinks a day for men.  Be aware of how much alcohol is in your drink. In the [place] one drink equals one 12 oz bottle of beer (355 mL), one 5 oz glass of wine (148 mL), or one 1 oz shot of hard liquor (44 mL). Eating and drinking   Eat foods that are high in fiber, such as fruits, vegetables, and whole grains.  Eat foods that  are high in calcium and vitamin D, such as milk, cheese, yogurt, eggs, liver, fish, and broccoli.  Limit foods that are high in fat, such as fried foods and desserts.  Limit the amount of red meat and processed meat you eat, such as hot dogs, sausage, bacon, and lunch meats. General instructions  Keep all follow-up visits as told by your health care provider. This is important. ? This includes having regularly scheduled colonoscopies. ? Talk to your health care provider about when you need a colonoscopy. Contact a health care provider if:  You have new or worsening bleeding during a bowel movement.  You have new or increased blood in your stool.  You have a change in bowel habits.  You lose weight for no known reason. Summary  Polyps are tissue growths inside the body. Polyps can grow in many places,  including the colon.  Most colon polyps are noncancerous (benign), but some can become cancerous over time.  This condition is diagnosed with a colonoscopy.  Treatment for this condition involves removing any polyps that are found. Most polyps can be removed during a colonoscopy. This information is not intended to replace advice given to you by your health care provider. Make sure you discuss any questions you have with your health care provider. Document Revised: 11/04/2017 Document Reviewed: 11/04/2017 Elsevier Patient Education  [place]. High-Fiber Diet Fiber, also called dietary fiber, is a type of carbohydrate that is found in fruits, vegetables, whole grains, and beans. A high-fiber diet can have many health benefits. Your health care provider may recommend a high-fiber diet to help:  Prevent constipation. Fiber can make your bowel movements more regular.  Lower your cholesterol.  Relieve the following conditions: ? Swelling of veins in the anus (hemorrhoids). ? Swelling and irritation (inflammation) of specific areas of the digestive tract (uncomplicated  diverticulosis). ? A problem of the large intestine (colon) that sometimes causes pain and diarrhea (irritable bowel syndrome, IBS).  Prevent overeating as part of a weight-loss plan.  Prevent heart disease, type 2 diabetes, and certain cancers. What is my plan? The recommended daily fiber intake in grams (g) includes:  38 g for men age 17 or younger.  30 g for men over age 77.  65 g for women age 79 or younger.  21 g for women over age 46. You can get the recommended daily intake of dietary fiber by:  Eating a variety of fruits, vegetables, grains, and beans.  Taking a fiber supplement, if it is not possible to get enough fiber through your diet. What do I need to know about a high-fiber diet?  It is better to get fiber through food sources rather than from fiber supplements. There is not a lot of research about how effective supplements are.  Always check the fiber content on the nutrition facts label of any prepackaged food. Look for foods that contain 5 g of fiber or more per serving.  Talk with a diet and nutrition specialist (dietitian) if you have questions about specific foods that are recommended or not recommended for your medical condition, especially if those foods are not listed below.  Gradually increase how much fiber you consume. If you increase your intake of dietary fiber too quickly, you may have bloating, cramping, or gas.  Drink plenty of water. Water helps you to digest fiber. What are tips for following this plan?  Eat a wide variety of high-fiber foods.  Make sure that half of the grains that you eat each day are whole grains.  Eat breads and cereals that are made with whole-grain flour instead of refined flour or white flour.  Eat brown rice, bulgur wheat, or millet instead of white rice.  Start the day with a breakfast that is high in fiber, such as a cereal that contains 5 g of fiber or more per serving.  Use beans in place of meat in soups,  salads, and pasta dishes.  Eat high-fiber snacks, such as berries, raw vegetables, nuts, and popcorn.  Choose whole fruits and vegetables instead of processed forms like juice or sauce. What foods can I eat?  Fruits Berries. Pears. Apples. Oranges. Avocado. Prunes and raisins. Dried figs. Vegetables Sweet potatoes. Spinach. Kale. Artichokes. Cabbage. Broccoli. Cauliflower. Green peas. Carrots. Squash. Grains Whole-grain breads. Multigrain cereal. Oats and oatmeal. Brown rice. Barley. Bulgur wheat. Liberal. Quinoa.  Bran muffins. Popcorn. Rye wafer crackers. Meats and other proteins Navy, kidney, and pinto beans. Soybeans. Split peas. Lentils. Nuts and seeds. Dairy Fiber-fortified yogurt. Beverages Fiber-fortified soy milk. Fiber-fortified orange juice. Other foods Fiber bars. The items listed above may not be a complete list of recommended foods and beverages. Contact a dietitian for more options. What foods are not recommended? Fruits Fruit juice. Cooked, strained fruit. Vegetables Fried potatoes. Canned vegetables. Well-cooked vegetables. Grains White bread. Pasta made with refined flour. White rice. Meats and other proteins Fatty cuts of meat. Fried chicken or fried fish. Dairy Milk. Yogurt. Cream cheese. Sour cream. Fats and oils Butters. Beverages Soft drinks. Other foods Cakes and pastries. The items listed above may not be a complete list of foods and beverages to avoid. Contact a dietitian for more information. Summary  Fiber is a type of carbohydrate. It is found in fruits, vegetables, whole grains, and beans.  There are many health benefits of eating a high-fiber diet, such as preventing constipation, lowering blood cholesterol, helping with weight loss, and reducing your risk of heart disease, diabetes, and certain cancers.  Gradually increase your intake of fiber. Increasing too fast can result in cramping, bloating, and gas. Drink plenty of water while you  increase your fiber.  The best sources of fiber include whole fruits and vegetables, whole grains, nuts, seeds, and beans. This information is not intended to replace advice given to you by your health care provider. Make sure you discuss any questions you have with your health care provider. Document Revised: 05/24/2017 Document Reviewed: 05/24/2017 Elsevier Patient Education  2020 [place] are being discharged to home.  Eat a high fiber diet.  We are waiting for your pathology results.  Your physician has recommended a repeat colonoscopy (date to be determined after pending pathology results are reviewed) for surveillance.

## 2020-03-26 NOTE — Op Note (Signed)
[facility] Patient Name: Jeanne Shaw Procedure Date: 03/26/2020 8:18 AM MRN: [id number] Date of Birth: Mar 31, 1956 Attending MD: Maylon Peppers ,  CSN: [id number] Age: 64 Admit Type: Outpatient Procedure:                Colonoscopy Indications:              High risk colon cancer surveillance: Personal                            history of colonic polyps Providers:                Maylon Peppers, Janeece Riggers, RN, Lambert Mody, Nelma Rothman, Technician Referring MD:              Medicines:                Monitored Anesthesia Care Complications:            No immediate complications. Estimated Blood Loss:     Estimated blood loss: none. Procedure:                Pre-Anesthesia Assessment:                           - Prior to the procedure, a History and Physical                            was performed, and patient medications, allergies                            and sensitivities were reviewed. The patient's                            tolerance of previous anesthesia was reviewed.                           - The risks and benefits of the procedure and the                            sedation options and risks were discussed with the                            patient. All questions were answered and informed                            consent was obtained.                           - ASA Grade Assessment: II - A patient with mild                            systemic disease.                           After obtaining informed consent, the colonoscope  was passed under direct vision. Throughout the                            procedure, the patient's blood pressure, pulse, and                            oxygen saturations were monitored continuously. The                            [id number]-[id number] ([id number]) scope was introduced through                            the anus and advanced to the the terminal ileum.                             The colonoscopy was performed without difficulty.                            The patient tolerated the procedure well. The                            quality of the bowel preparation was adequate to                            identify polyps. Scope withdrawal time was 12                            minutes. Scope In: 8:24:33 AM Scope Out: 8:51:57 AM Scope Withdrawal Time: 0 hours 16 minutes 1 second  Total Procedure Duration: 0 hours 27 minutes 24 seconds  Findings:      The perianal and digital rectal examinations were normal.      The terminal ileum appeared normal.      Two sessile polyps were found in the ascending colon and cecum. The       polyps were 1 to 2 mm in size. These polyps were removed with a cold       biopsy forceps. Resection and retrieval were complete.      A 5 mm polyp was found in the transverse colon. The polyp was sessile.       The polyp was removed with a cold snare. Resection and retrieval were       complete.      The retroflexed view of the distal rectum and anal verge was normal and       showed no anal or rectal abnormalities. Impression:               - The examined portion of the ileum was normal.                           - Two 1 to 2 mm polyps in the ascending colon and                            in the cecum, removed with a cold biopsy forceps.  Resected and retrieved.                           - One 5 mm polyp in the transverse colon, removed                            with a cold snare. Resected and retrieved.                           - The distal rectum and anal verge are normal on                            retroflexion view. Moderate Sedation:      Per Anesthesia Care Recommendation:           - Discharge patient to home (ambulatory).                           - High fiber diet.                           - Await pathology results.                           - Repeat colonoscopy date to be determined after                             pending pathology results are reviewed for                            surveillance. Procedure Code(s):        --- Professional ---                           [id number], GC, Colonoscopy, flexible; with removal of                            tumor(s), polyp(s), or other lesion(s) by snare                            technique                           45380, 54, Colonoscopy, flexible; with biopsy,                            single or multiple Diagnosis Code(s):        --- Professional ---                           K63.5, Polyp of colon                           Z86.010, Personal history of colonic polyps CPT copyright 2019 American Medical Association. All rights reserved. The codes documented in this report are preliminary and upon coder review may  be revised to meet current compliance requirements. Maylon Peppers, MD Maylon Peppers,  03/26/2020 8:57:51 AM This report has been signed electronically. Number of Addenda: 0

## 2020-03-26 NOTE — Transfer of Care (Signed)
Immediate Anesthesia Transfer of Care Note  Patient: Jeanne Shaw  Procedure(s) Performed: COLONOSCOPY WITH PROPOFOL (N/A ) POLYPECTOMY BIOPSY  Patient Location: Endoscopy Unit  Anesthesia Type:General  Level of Consciousness: awake, alert , oriented and patient cooperative  Airway & Oxygen Therapy: Patient Spontanous Breathing  Post-op Assessment: Report given to RN, Post -op Vital signs reviewed and stable and Patient moving all extremities  Post vital signs: Reviewed and stable  Last Vitals:  Vitals Value Taken Time  BP 92/48 03/26/20 0854  Temp    Pulse 71 03/26/20 0854  Resp 15 03/26/20 0854  SpO2 98 % 03/26/20 0854    Last Pain:  Vitals:   03/26/20 0818  TempSrc:   PainSc: 0-No pain         Complications: No complications documented.

## 2020-03-26 NOTE — Anesthesia Postprocedure Evaluation (Signed)
Anesthesia Post Note  Patient: Jeanne Shaw  Procedure(s) Performed: COLONOSCOPY WITH PROPOFOL (N/A ) POLYPECTOMY BIOPSY  Patient location during evaluation: Endoscopy Anesthesia Type: General Level of consciousness: awake, oriented, awake and alert and patient cooperative Pain management: pain level controlled Vital Signs Assessment: post-procedure vital signs reviewed and stable Respiratory status: spontaneous breathing, respiratory function stable and nonlabored ventilation Cardiovascular status: blood pressure returned to baseline and stable Postop Assessment: no headache and no backache Anesthetic complications: no   No complications documented.   Last Vitals:  Vitals:   03/26/20 0724 03/26/20 0854  BP: (!) 142/66 (!) 92/48  Pulse: 70 71  Resp: 16 15  Temp: 36.6 C   SpO2: 97% 98%    Last Pain:  Vitals:   03/26/20 0818  TempSrc:   PainSc: 0-No pain                 Tacy Learn

## 2020-03-26 NOTE — Anesthesia Preprocedure Evaluation (Signed)
Anesthesia Evaluation  Patient identified by MRN, date of birth, ID band Patient awake    Reviewed: Allergy & Precautions, H&P , NPO status , Patient's Chart, lab work & pertinent test results, reviewed documented beta blocker date and time   Airway Mallampati: II  TM Distance: >3 FB Neck ROM: full    Dental no notable dental hx. (+) Teeth Intact   Pulmonary asthma , former smoker,    Pulmonary exam normal breath sounds clear to auscultation       Cardiovascular Exercise Tolerance: Good hypertension, negative cardio ROS   Rhythm:regular Rate:Normal     Neuro/Psych Anxiety negative neurological ROS  negative psych ROS   GI/Hepatic Neg liver ROS, GERD  Medicated,  Endo/Other  negative endocrine ROSdiabetes  Renal/GU negative Renal ROS  negative genitourinary   Musculoskeletal   Abdominal   Peds  Hematology negative hematology ROS (+)   Anesthesia Other Findings   Reproductive/Obstetrics negative OB ROS                             Anesthesia Physical Anesthesia Plan  ASA: II  Anesthesia Plan: General   Post-op Pain Management:    Induction:   PONV Risk Score and Plan: Propofol infusion  Airway Management Planned:   Additional Equipment:   Intra-op Plan:   Post-operative Plan:   Informed Consent: I have reviewed the patients History and Physical, chart, labs and discussed the procedure including the risks, benefits and alternatives for the proposed anesthesia with the patient or authorized representative who has indicated his/her understanding and acceptance.     Dental Advisory Given  Plan Discussed with: CRNA  Anesthesia Plan Comments:         Anesthesia Quick Evaluation

## 2020-03-26 NOTE — H&P (Signed)
Jeanne Shaw is an 64 y.o. female.   Chief Complaint: History of colon polyps. HPI: 64 year old female with past medical history of asthma, hypertension, GERD, anxiety, who came to the hospital for surveillance colonoscopy.  The patient had a history of colon polyps in her most recent colonoscopy 3 years ago when she was living at [place].  She moved to [place] 3 years ago.  She reported that she is due for surveillance colonoscopy based on the recommendations of her previous procedure.  The patient denies having any complaints.  The patient denies having any nausea, vomiting, fever, chills, hematochezia, melena, hematemesis, abdominal distention, abdominal pain, diarrhea, jaundice, pruritus or weight loss.  Patient denies any Raquel Sarna history of colorectal cancer.  She had 3 family members with history of lung and cervix cancer.  Past Medical History:  Diagnosis Date  . Allergy   . Anxiety   . Asthma   . Cataract    OU  . Diabetes mellitus without complication ([place])   . GERD (gastroesophageal reflux disease)   . Hypertension   . Macular degeneration    Dry OU    Past Surgical History:  Procedure Laterality Date  . CYST EXCISION     off vocal cord     Family History  Problem Relation Age of Onset  . COPD Mother   . Alcohol abuse Father   . Diabetes Father   . Heart disease Father   . Hyperlipidemia Father   . Hypertension Father   . Cancer Sister   . Diabetes Sister   . Hyperlipidemia Sister   . Hypertension Sister   . Breast cancer Sister 38  . Hypertension Brother   . Alcohol abuse Maternal Aunt   . Alcohol abuse Maternal Uncle   . Hypertension Maternal Uncle   . Alcohol abuse Paternal Aunt   . Cancer Paternal Aunt   . Alcohol abuse Paternal Uncle   . Cancer Paternal Uncle   . Diabetes Maternal Grandmother   . Stroke Maternal Grandmother   . Heart disease Maternal Grandfather    Social History:  reports that she quit smoking about 4 years ago. She  has quit using smokeless tobacco. She reports current alcohol use. She reports that she does not use drugs.  Allergies:  Allergies  Allergen Reactions  . Hydrocodone Anxiety    Medications Prior to Admission  Medication Sig Dispense Refill  . Ascorbic Acid (VITAMIN C) 1000 MG tablet Take 1,000 mg by mouth daily.    Marland Kitchen aspirin EC 81 MG tablet Take 81 mg by mouth daily.    . Cholecalciferol (VITAMIN D) 125 MCG (5000 UT) CAPS Take 5,000 Units by mouth daily.     . dapagliflozin propanediol (FARXIGA) 5 MG TABS tablet Take 1 tablet (5 mg total) by mouth daily before breakfast. 90 tablet 3  . Flaxseed, Linseed, (FLAX SEEDS PO) Take 1 tablet by mouth daily.     Marland Kitchen lisinopril (ZESTRIL) 10 MG tablet Take 1 tablet (10 mg total) by mouth daily. 90 tablet 2  . metFORMIN (GLUCOPHAGE) 1000 MG tablet Take 1 tablet (1,000 mg total) by mouth 2 (two) times daily with a meal. 180 tablet 2  . MILK THISTLE PO Take 1 tablet by mouth at bedtime.     . Multiple Vitamins-Minerals (PRESERVISION AREDS) CAPS Take 1 capsule by mouth at bedtime.     . Omega-3 Fatty Acids (FISH OIL) 1000 MG CAPS Take 1 capsule (1,000 mg total) by mouth daily. (Patient taking differently: Take 1,000  mg by mouth daily. )  0  . omeprazole (PRILOSEC) 40 MG capsule Take 1 capsule (40 mg total) by mouth daily. 90 capsule 0  . PLENVU 140 g SOLR Take by mouth.    . Red Yeast Rice Extract (RED YEAST RICE PO) Take 1 tablet by mouth daily.     . simvastatin (ZOCOR) 10 MG tablet Take 1 tablet (10 mg total) by mouth at bedtime. 90 tablet 2  . sitaGLIPtin (JANUVIA) 100 MG tablet Take 1 tablet (100 mg total) by mouth daily. 90 tablet 2  . sodium chloride (OCEAN) 0.65 % SOLN nasal spray Place 1 spray into both nostrils daily as needed for congestion.    . SPS 15 GM/60ML suspension Take by mouth.      Results for orders placed or performed during the hospital encounter of 03/26/20 (from the past 48 hour(s))  Glucose, capillary     Status: Abnormal    Collection Time: 03/26/20  7:33 AM  Result Value Ref Range   Glucose-Capillary 165 (H) 70 - 99 mg/dL    Comment: Glucose reference range applies only to samples taken after fasting for at least 8 hours.   No results found.  Review of Systems  Constitutional: Negative.   HENT: Negative.   Eyes: Negative.   Respiratory: Negative.   Cardiovascular: Negative.   Gastrointestinal: Negative.   Endocrine: Negative.   Genitourinary: Negative.   Musculoskeletal: Negative.   Skin: Negative.   Allergic/Immunologic: Negative.   Neurological: Negative.   Hematological: Negative.   Psychiatric/Behavioral: Negative.     Blood pressure (!) 142/66, pulse 70, temperature 97.9 F (36.6 C), temperature source Oral, resp. rate 16, height 5' 1.75" (1.568 m), SpO2 97 %. Physical Exam  GENERAL: The patient is AO x3, in no acute distress. Obese. HEENT: Head is normocephalic and atraumatic. EOMI are intact. Mouth is well hydrated and without lesions. NECK: Supple. No masses LUNGS: Clear to auscultation. No presence of rhonchi/wheezing/rales. Adequate chest expansion HEART: RRR, normal s1 and s2. ABDOMEN: Soft, nontender, no guarding, no peritoneal signs, and nondistended. BS +. No masses. EXTREMITIES: Without any cyanosis, clubbing, rash, lesions or edema. NEUROLOGIC: AOx3, no focal motor deficit. SKIN: no jaundice, no rashes  Assessment/Plan 64 year old female with past medical history of asthma, hypertension, GERD, anxiety, who came to the hospital for surveillance colonoscopy due to history of colon polyps.  We will proceed with colonoscopy today, if normal will need to repeat in 5 years.  Harvel Quale, MD 03/26/2020, 8:07 AM

## 2020-03-27 LAB — SURGICAL PATHOLOGY

## 2020-03-28 ENCOUNTER — Encounter (HOSPITAL_COMMUNITY): Payer: Self-pay | Admitting: Gastroenterology

## 2020-03-28 ENCOUNTER — Encounter: Payer: Self-pay | Admitting: Family Medicine

## 2020-03-28 ENCOUNTER — Other Ambulatory Visit: Payer: Self-pay | Admitting: Family Medicine

## 2020-03-28 MED ORDER — SIMVASTATIN 10 MG PO TABS
10.0000 mg | ORAL_TABLET | Freq: Every day | ORAL | 2 refills | Status: DC
Start: 2020-03-28 — End: 2020-08-26

## 2020-03-29 MED ORDER — OMEPRAZOLE 40 MG PO CPDR: 40.0000 mg | DELAYED_RELEASE_CAPSULE | Freq: Every day | ORAL | 0 refills | Status: DC

## 2020-04-15 ENCOUNTER — Encounter: Payer: Self-pay | Admitting: Family Medicine

## 2020-04-16 ENCOUNTER — Encounter: Payer: Self-pay | Admitting: Family Medicine

## 2020-04-16 ENCOUNTER — Other Ambulatory Visit: Payer: Self-pay

## 2020-04-16 ENCOUNTER — Ambulatory Visit (INDEPENDENT_AMBULATORY_CARE_PROVIDER_SITE_OTHER): Payer: Medicare Other | Admitting: Family Medicine

## 2020-04-16 VITALS — HR 93 | Temp 98.3°F | Resp 16

## 2020-04-16 DIAGNOSIS — B349 Viral infection, unspecified: Secondary | ICD-10-CM | POA: Diagnosis not present

## 2020-04-16 DIAGNOSIS — J019 Acute sinusitis, unspecified: Secondary | ICD-10-CM

## 2020-04-16 DIAGNOSIS — Z20822 Contact with and (suspected) exposure to covid-19: Secondary | ICD-10-CM | POA: Diagnosis not present

## 2020-04-16 MED ORDER — ONDANSETRON HCL 4 MG PO TABS: 4.0000 mg | ORAL_TABLET | Freq: Three times a day (TID) | ORAL | 0 refills | Status: DC | PRN

## 2020-04-16 MED ORDER — AMOXICILLIN 875 MG PO TABS: 875.0000 mg | ORAL_TABLET | Freq: Two times a day (BID) | ORAL | 0 refills | Status: DC

## 2020-04-16 NOTE — Patient Instructions (Addendum)
We will call with results mucinex DM or robitussin for cough

## 2020-04-16 NOTE — Progress Notes (Signed)
   Subjective:    Patient ID: Jeanne Shaw, female    DOB: September 11, 1955, 64 y.o.   MRN: [id number]  Patient presents for Sinusitis  Patient here with sick symptoms for the past almost 2 weeks.  She has had sinus drainage pressure with dry mouth.  Nausea that has persisted.  She has chills and body aches occasional cough.  Her appetite is decreased secondary to the nausea.  She has not had any diarrhea or vomiting.  She did go to [facility] about 2 weeks ago but no known sick contacts.  Her spouse does not have any symptoms.  Her blood sugars have been good.  She has been trying to hydrate herself with ginger ale and water as much as possible. She has not been vaccinated for COVID-19.   Review Of Systems:  GEN- denies fatigue, fever, weight loss,weakness, recent illness HEENT- denies eye drainage, change in vision, +nasal discharge, CVS- denies chest pain, palpitations RESP- denies SOB,+ cough, wheeze ABD- +N/ DENIES V, change in stools, abd pain GU- denies dysuria, hematuria, dribbling, incontinence MSK- denies joint pain, +muscle aches, injury Neuro- denies headache, dizziness, syncope, seizure activity       Objective:    Pulse 93   Temp 98.3 F (36.8 C)   Resp 16   SpO2 94%  GEN- NAD, alert and oriented x3, non toxic appearing -  Examined in CAR due to COVID symptoms  HEENT- PERRL, EOMI, non injected sclera, pink conjunctiva, MMM, oropharynx clear, clear rhinorrhea, mild sinus tenderness  Neck- Supple, no LAD  CVS- RRR, no murmur RESP-CTAB ABD-NABS,soft,NT,ND EXT- No edema Pulses- Radial 2+        Assessment & Plan:      Problem List Items Addressed This Visit    None    Visit Diagnoses    Encounter for laboratory testing for COVID-19 virus    -  Primary   Relevant Orders   SARS-COV-2 RNA,(COVID-19) QUAL NAAT   Acute rhinosinusitis       Relevant Medications   amoxicillin (AMOXIL) 875 MG tablet   Viral illness       Viral symptoms and  sinusitis concerning for covid, not improving after 10 days, so will add amox, mucinex DM, zofran for nausea, benign abd exam, covid testing,      Note: This dictation was prepared with Dragon dictation along with smaller phrase technology. Any transcriptional errors that result from this process are unintentional.

## 2020-04-18 LAB — SARS-COV-2 RNA,(COVID-19) QUALITATIVE NAAT: SARS CoV2 RNA: NOT DETECTED

## 2020-04-20 ENCOUNTER — Other Ambulatory Visit: Payer: Self-pay | Admitting: Family Medicine

## 2020-05-06 ENCOUNTER — Encounter: Payer: Self-pay | Admitting: Family Medicine

## 2020-05-20 ENCOUNTER — Other Ambulatory Visit: Payer: Self-pay | Admitting: Family Medicine

## 2020-05-22 ENCOUNTER — Other Ambulatory Visit: Payer: Self-pay | Admitting: Family Medicine

## 2020-05-22 DIAGNOSIS — I10 Essential (primary) hypertension: Secondary | ICD-10-CM

## 2020-06-24 ENCOUNTER — Other Ambulatory Visit: Payer: Self-pay | Admitting: Family Medicine

## 2020-06-24 MED ORDER — OMEPRAZOLE 40 MG PO CPDR
40.0000 mg | DELAYED_RELEASE_CAPSULE | Freq: Every day | ORAL | 0 refills | Status: DC
Start: 2020-06-24 — End: 2020-08-26

## 2020-07-15 ENCOUNTER — Ambulatory Visit (INDEPENDENT_AMBULATORY_CARE_PROVIDER_SITE_OTHER): Payer: Medicare Other | Admitting: Family Medicine

## 2020-07-15 ENCOUNTER — Encounter: Payer: Self-pay | Admitting: Family Medicine

## 2020-07-15 ENCOUNTER — Other Ambulatory Visit: Payer: Self-pay

## 2020-07-15 VITALS — BP 130/72 | HR 82 | Temp 97.8°F | Resp 14 | Ht 61.0 in | Wt 173.0 lb

## 2020-07-15 DIAGNOSIS — E785 Hyperlipidemia, unspecified: Secondary | ICD-10-CM | POA: Diagnosis not present

## 2020-07-15 DIAGNOSIS — I10 Essential (primary) hypertension: Secondary | ICD-10-CM | POA: Diagnosis not present

## 2020-07-15 DIAGNOSIS — E669 Obesity, unspecified: Secondary | ICD-10-CM

## 2020-07-15 DIAGNOSIS — E119 Type 2 diabetes mellitus without complications: Secondary | ICD-10-CM | POA: Diagnosis not present

## 2020-07-15 DIAGNOSIS — Z23 Encounter for immunization: Secondary | ICD-10-CM

## 2020-07-15 NOTE — Patient Instructions (Signed)
F/u 4 month wellness visit

## 2020-07-15 NOTE — Assessment & Plan Note (Signed)
Controlled no changes to meds Check renal function  Eye exam UTD

## 2020-07-15 NOTE — Assessment & Plan Note (Signed)
Controlled with oral meds Goal A1C less than 7% PNA vaccine given today  On statin drug

## 2020-07-15 NOTE — Progress Notes (Signed)
   Subjective:    Patient ID: Veronda Prude, female    DOB: 12-16-1955, 64 y.o.   MRN: [id number]  Patient presents for Follow-up (Is fasting)  Patient here to follow-up chronic medical problems.  Medications reviewed.  Patient here to follow-up on medical problems.  Medications reviewed  Diabetes mellitus last A1c 6.7% in June,  Continued on MTF and Januvia , fARIXGO  No hypoglycemia symptoms CBG on AVG 140's    Hypertension-  she is currently taking lisinopril 10 mg daily  130/60-70's  Hyperlipidemia with fatty liver disease -currently taking simvastatin 10 mg at bedtime without any difficulty, Her TG were elevated at last visit   TG 145  In June, LDL  60 Chronic mild elevated ALT  60 Also uses red yeast Rice/ flaxseed /omega 3   [facility] - Dr. Coralyn Pear every 6 months   Due for PNA vaccine  Due for foot exam    Review Of Systems:  GEN- denies fatigue, fever, weight loss,weakness, recent illness HEENT- denies eye drainage, change in vision, nasal discharge, CVS- denies chest pain, palpitations RESP- denies SOB, cough, wheeze ABD- denies N/V, change in stools, abd pain GU- denies dysuria, hematuria, dribbling, incontinence MSK- denies joint pain, muscle aches, injury Neuro- denies headache, dizziness, syncope, seizure activity       Objective:    BP 130/72   Pulse 82   Temp 97.8 F (36.6 C) (Temporal)   Resp 14   Ht 5\' 1"  (1.549 m)   Wt 173 lb (78.5 kg)   SpO2 96%   BMI 32.69 kg/m  GEN- NAD, alert and oriented x3 HEENT- PERRL, EOMI, non injected sclera, pink conjunctiva, MMM, oropharynx clear Neck- Supple, no thyromegaly CVS- RRR, no murmur RESP-CTAB ABD-NABS,soft,NT,ND EXT- No edema Pulses- Radial, DP- 2+        Assessment & Plan:      Problem List Items Addressed This Visit      Unprioritized   Diabetes mellitus type 2, uncomplicated ([place])    Controlled with oral meds Goal A1C less than 7% PNA vaccine given  today  On statin drug       Relevant Orders   [facility] DIABETES FOOT EXAM (Completed)   Hemoglobin A1c   Essential hypertension - Primary    Controlled no changes to meds Check renal function  Eye exam UTD      Relevant Orders   CBC with Differential/Platelet   Comprehensive metabolic panel   Hyperlipidemia   Relevant Orders   Lipid panel   Obesity (BMI 30.0-34.9)    Maintaining weight Tries to stay active          Note: This dictation was prepared with Dragon dictation along with smaller phrase technology. Any transcriptional errors that result from this process are unintentional.

## 2020-07-15 NOTE — Addendum Note (Signed)
Addended by: Sheral Flow on: 07/15/2020 09:39 AM   Modules accepted: Orders

## 2020-07-15 NOTE — Assessment & Plan Note (Signed)
Maintaining weight Tries to stay active

## 2020-07-16 LAB — HEMOGLOBIN A1C
Hgb A1c MFr Bld: 7.2 % of total Hgb — ABNORMAL HIGH (ref <5.7)
Mean Plasma Glucose: 160 mg/dL
eAG (mmol/L): 8.9 mmol/L

## 2020-07-16 LAB — COMPREHENSIVE METABOLIC PANEL
AG Ratio: 1.8 (calc) (ref 1.0–2.5)
ALT: 53 U/L — ABNORMAL HIGH (ref 6–29)
AST: 22 U/L (ref 10–35)
Albumin: 4.1 g/dL (ref 3.6–5.1)
Alkaline phosphatase (APISO): 75 U/L (ref 37–153)
BUN: 19 mg/dL (ref 7–25)
CO2: 27 mmol/L (ref 20–32)
Calcium: 9.9 mg/dL (ref 8.6–10.4)
Chloride: 105 mmol/L (ref 98–110)
Creat: 0.58 mg/dL (ref 0.50–0.99)
Globulin: 2.3 g/dL (calc) (ref 1.9–3.7)
Glucose, Bld: 160 mg/dL — ABNORMAL HIGH (ref 65–99)
Potassium: 5.3 mmol/L (ref 3.5–5.3)
Sodium: 140 mmol/L (ref 135–146)
Total Bilirubin: 0.3 mg/dL (ref 0.2–1.2)
Total Protein: 6.4 g/dL (ref 6.1–8.1)

## 2020-07-16 LAB — CBC WITH DIFFERENTIAL/PLATELET
Absolute Monocytes: 412 cells/uL (ref 200–950)
Basophils Absolute: 101 cells/uL (ref 0–200)
Basophils Relative: 1.2 %
Eosinophils Absolute: 269 cells/uL (ref 15–500)
Eosinophils Relative: 3.2 %
HCT: 45.1 % — ABNORMAL HIGH (ref 35.0–45.0)
Hemoglobin: 15 g/dL (ref 11.7–15.5)
Lymphs Abs: 2604 cells/uL (ref 850–3900)
MCH: 31.4 pg (ref 27.0–33.0)
MCHC: 33.3 g/dL (ref 32.0–36.0)
MCV: 94.5 fL (ref 80.0–100.0)
MPV: 9.3 fL (ref 7.5–12.5)
Monocytes Relative: 4.9 %
Neutro Abs: 5015 cells/uL (ref 1500–7800)
Neutrophils Relative %: 59.7 %
Platelets: 502 10*3/uL — ABNORMAL HIGH (ref 140–400)
RBC: 4.77 10*6/uL (ref 3.80–5.10)
RDW: 12.4 % (ref 11.0–15.0)
Total Lymphocyte: 31 %
WBC: 8.4 10*3/uL (ref 3.8–10.8)

## 2020-07-16 LAB — LIPID PANEL
Cholesterol: 128 mg/dL (ref <200)
HDL: 35 mg/dL — ABNORMAL LOW (ref >=50)
LDL Cholesterol (Calc): 66 mg/dL (calc)
Non-HDL Cholesterol (Calc): 93 mg/dL (calc) (ref <130)
Total CHOL/HDL Ratio: 3.7 (calc) (ref <5.0)
Triglycerides: 207 mg/dL — ABNORMAL HIGH (ref <150)

## 2020-08-15 ENCOUNTER — Other Ambulatory Visit: Payer: Self-pay | Admitting: Family Medicine

## 2020-08-16 MED ORDER — SITAGLIPTIN PHOSPHATE 100 MG PO TABS
100.0000 mg | ORAL_TABLET | Freq: Every day | ORAL | 0 refills | Status: DC
Start: 2020-08-16 — End: 2020-08-26

## 2020-08-17 ENCOUNTER — Encounter: Payer: Self-pay | Admitting: Family Medicine

## 2020-08-17 ENCOUNTER — Other Ambulatory Visit: Payer: Self-pay | Admitting: Family Medicine

## 2020-08-17 DIAGNOSIS — I10 Essential (primary) hypertension: Secondary | ICD-10-CM

## 2020-08-20 MED ORDER — LISINOPRIL 10 MG PO TABS: 10.0000 mg | ORAL_TABLET | Freq: Every day | ORAL | 0 refills | Status: DC

## 2020-08-26 ENCOUNTER — Other Ambulatory Visit: Payer: Self-pay

## 2020-08-26 ENCOUNTER — Encounter: Payer: Self-pay | Admitting: Family Medicine

## 2020-08-26 ENCOUNTER — Ambulatory Visit (INDEPENDENT_AMBULATORY_CARE_PROVIDER_SITE_OTHER): Payer: Medicare Other | Admitting: Family Medicine

## 2020-08-26 VITALS — BP 128/72 | HR 87 | Temp 98.4°F | Resp 16 | Wt 171.4 lb

## 2020-08-26 DIAGNOSIS — H8110 Benign paroxysmal vertigo, unspecified ear: Secondary | ICD-10-CM | POA: Diagnosis not present

## 2020-08-26 DIAGNOSIS — R221 Localized swelling, mass and lump, neck: Secondary | ICD-10-CM

## 2020-08-26 DIAGNOSIS — I10 Essential (primary) hypertension: Secondary | ICD-10-CM

## 2020-08-26 MED ORDER — MECLIZINE HCL 12.5 MG PO TABS: 12.5000 mg | ORAL_TABLET | Freq: Three times a day (TID) | ORAL | 0 refills | Status: DC | PRN

## 2020-08-26 MED ORDER — DAPAGLIFLOZIN PROPANEDIOL 5 MG PO TABS
5.0000 mg | ORAL_TABLET | Freq: Every day | ORAL | 2 refills | Status: DC
Start: 2020-08-26 — End: 2021-05-29

## 2020-08-26 MED ORDER — SITAGLIPTIN PHOSPHATE 100 MG PO TABS
100.0000 mg | ORAL_TABLET | Freq: Every day | ORAL | 2 refills | Status: DC
Start: 2020-08-26 — End: 2021-08-15

## 2020-08-26 MED ORDER — METFORMIN HCL 1000 MG PO TABS
ORAL_TABLET | ORAL | 2 refills | Status: DC
Start: 2020-08-26 — End: 2021-07-18

## 2020-08-26 MED ORDER — OMEPRAZOLE 40 MG PO CPDR
40.0000 mg | DELAYED_RELEASE_CAPSULE | Freq: Every day | ORAL | 2 refills | Status: DC
Start: 2020-08-26 — End: 2021-07-08

## 2020-08-26 MED ORDER — SIMVASTATIN 10 MG PO TABS
10.0000 mg | ORAL_TABLET | Freq: Every day | ORAL | 2 refills | Status: DC
Start: 2020-08-26 — End: 2021-08-25

## 2020-08-26 MED ORDER — LISINOPRIL 10 MG PO TABS: 10.0000 mg | ORAL_TABLET | Freq: Every day | ORAL | 2 refills | Status: DC

## 2020-08-26 NOTE — Assessment & Plan Note (Signed)
Controlled no changes 

## 2020-08-26 NOTE — Patient Instructions (Signed)
Update me in a few weeks about the cyst Meclizine sent for vertigo

## 2020-08-26 NOTE — Progress Notes (Signed)
   Subjective:    Patient ID: Jeanne Shaw, female    DOB: February 21, 1956, 65 y.o.   MRN: [id number]  Patient presents for Mass (Lump on back of neck for past week )   She has noticed lump on back of neck for the past week or so, mld discomfort with movement No drainage,  No new radiating pain  Patient has had a few episodes of dizziness.  When she turns of the bed she will sometimes have a discomfort in the ankles are steady although she bends over picking up something or she gets up too fast.  She had an episode recently committed a candidate but states she is only really had 2-3 a year.  Often they are very short-lived and go away on their own.  No nausea vomiting associated no change in her vision no syncope no chest pain.  This has been going on for a couple years. No recent infection or URI   CBG this AM 147   Review Of Systems:  GEN- denies fatigue, fever, weight loss,weakness, recent illness HEENT- denies eye drainage, change in vision, nasal discharge, CVS- denies chest pain, palpitations RESP- denies SOB, cough, wheeze ABD- denies N/V, change in stools, abd pain GU- denies dysuria, hematuria, dribbling, incontinence MSK- denies joint pain, muscle aches, injury Neuro- denies headache, +dizziness, syncope, seizure activity       Objective:    BP 128/72   Pulse 87   Temp 98.4 F (36.9 C)   Resp 16   Wt 171 lb 6.4 oz (77.7 kg)   SpO2 97%   BMI 32.39 kg/m  GEN- NAD, alert and oriented x3 HEENT- PERRL, EOMI, non injected sclera, pink conjunctiva, MMM, oropharynx clear, TM clear bilaterally no effusion nares clear Neck- Supple, no thyromegaly, place.  Neck left side subcutaneous mass( small grape size) palpated mobile nontender no erythema no core noted, no carotid bruit CVS- RRR, no murmur RESP-CTAB Neuro cranial nerves II through XII grossly intact no focal deficits EXT- No edema Pulses- Radial 2+        Assessment & Plan:      Problem List Items  Addressed This Visit      Unprioritized   Essential hypertension    Controlled no changes       Relevant Medications   lisinopril (ZESTRIL) 10 MG tablet   simvastatin (ZOCOR) 10 MG tablet    Other Visit Diagnoses    Neck mass/ cyst     -  Primary   Based on palpation cystic lesion ? dermoid vs lipoma only felt fo 1 week, no sign of abscess, no other lymph nodes palpable ,  Although lymph node is a possibility.  Advised taking on the way to differentiate we will get an ultrasound of the area.  She has not had any discomfort there is no weight loss.  No other symptoms associated she was monitored for couple weeks ago think is reasonable.  If she notices any growth/pain or any new areas pop up then we will definitely proceed with imaging.   Benign paroxysmal positional vertigo, unspecified laterality       Meclizine prn, if symptoms worsen, send to ENT       Note: This dictation was prepared with Dragon dictation along with smaller phrase technology. Any transcriptional errors that result from this process are unintentional.

## 2020-09-09 ENCOUNTER — Encounter: Payer: Self-pay | Admitting: Family Medicine

## 2020-09-09 DIAGNOSIS — R221 Localized swelling, mass and lump, neck: Secondary | ICD-10-CM

## 2020-09-17 ENCOUNTER — Ambulatory Visit (HOSPITAL_COMMUNITY): Payer: Medicare Other

## 2020-09-17 NOTE — Progress Notes (Addendum)
[facility] Note  09/20/2020     CHIEF COMPLAINT Patient presents for Retina Follow Up   HISTORY OF PRESENT ILLNESS: Jeanne Shaw is a 65 y.o. female who presents to the clinic today for:   HPI    Retina Follow Up    Patient presents with  Dry AMD.  In both eyes.  This started years ago.  Severity is mild.  Duration of 6 months.  Since onset it is stable.  I, the attending physician,  performed the HPI with the patient and updated documentation appropriately.          Comments    65 y/o female pt here for 6 mo f/u for dry ARMD OU.  No change in [place] OU.  Denies pain, FOL, floaters.  No gtts.  BS 146 when last checked.  A1C around 7.6.       Last edited by Bernarda Caffey, MD on 09/20/2020  8:15 AM. (History)    pt states no change in vision, she is still taking AREDS and monitoring her vision on an amsler grid  Referring physician: Alycia Rossetti, MD [address] [place],  [place] [address]  HISTORICAL INFORMATION:   Selected notes from the [facility] Referred by M. Dixon, PA-C for DM exam Ocular Hx-  PMH- DM (A1C 7.4 on 04.01.19), HTN, asthma,     CURRENT MEDICATIONS: No current outpatient medications on file. (Ophthalmic Drugs)   No current facility-administered medications for this visit. (Ophthalmic Drugs)   Current Outpatient Medications (Other)  Medication Sig  . Ascorbic Acid (VITAMIN C) 1000 MG tablet Take 1,000 mg by mouth daily.  Marland Kitchen aspirin EC 81 MG tablet Take 81 mg by mouth daily.  . Cholecalciferol (VITAMIN D) 125 MCG (5000 UT) CAPS Take 5,000 Units by mouth daily.   . dapagliflozin propanediol (FARXIGA) 5 MG TABS tablet Take 1 tablet (5 mg total) by mouth daily before breakfast.  . Flaxseed, Linseed, (FLAX SEEDS PO) Take 1 tablet by mouth daily.   Marland Kitchen lisinopril (ZESTRIL) 10 MG tablet Take 1 tablet (10 mg total) by mouth daily.  . meclizine (ANTIVERT) 12.5 MG tablet Take 1 tablet (12.5 mg total) by  mouth 3 (three) times daily as needed for dizziness.  . metFORMIN (GLUCOPHAGE) 1000 MG tablet TAKE 1 TABLET BY MOUTH TWICE DAILY WITH A MEAL  . MILK THISTLE PO Take 1 tablet by mouth at bedtime.   . Multiple Vitamins-Minerals (PRESERVISION AREDS) CAPS Take 1 capsule by mouth at bedtime.   . Omega-3 Fatty Acids (FISH OIL) 1000 MG CAPS Take 1 capsule (1,000 mg total) by mouth daily. (Patient taking differently: Take 1,000 mg by mouth daily.)  . omeprazole (PRILOSEC) 40 MG capsule Take 1 capsule (40 mg total) by mouth daily.  . Red Yeast Rice Extract (RED YEAST RICE PO) Take 1 tablet by mouth daily.   . simvastatin (ZOCOR) 10 MG tablet Take 1 tablet (10 mg total) by mouth at bedtime.  . sitaGLIPtin (JANUVIA) 100 MG tablet Take 1 tablet (100 mg total) by mouth daily.  . sodium chloride (OCEAN) 0.65 % SOLN nasal spray Place 1 spray into both nostrils daily as needed for congestion.  . Zinc 50 MG CAPS    No current facility-administered medications for this visit. (Other)      REVIEW OF SYSTEMS: ROS    Positive for: Skin, Endocrine, Eyes   Negative for: Constitutional, Gastrointestinal, Neurological, Genitourinary, Musculoskeletal, HENT, Cardiovascular, Respiratory, Psychiatric,  Allergic/Imm, Heme/Lymph   Last edited by Matthew Folks, COA on 09/20/2020  7:47 AM. (History)       ALLERGIES Allergies  Allergen Reactions  . Hydrocodone Anxiety    PAST MEDICAL HISTORY Past Medical History:  Diagnosis Date  . Allergy   . Anxiety   . Asthma   . Cataract    OU  . Diabetes mellitus without complication ([place])   . GERD (gastroesophageal reflux disease)   . Hypertension   . Macular degeneration    Dry OU   Past Surgical History:  Procedure Laterality Date  . BIOPSY  03/26/2020   Procedure: BIOPSY;  Surgeon: Harvel Quale, MD;  [facility]: [facility];  Service: Gastroenterology;;  . COLONOSCOPY WITH PROPOFOL N/A 03/26/2020   Procedure: COLONOSCOPY WITH PROPOFOL;  Surgeon:  Harvel Quale, MD;  Location: [facility];  Service: Gastroenterology;  Laterality: N/A;  815  . CYST EXCISION     off vocal cord   . POLYPECTOMY  03/26/2020   Procedure: POLYPECTOMY;  Surgeon: Harvel Quale, MD;  [facility]: [facility];  Service: Gastroenterology;;    FAMILY HISTORY Family History  Problem Relation Age of Onset  . COPD Mother   . Alcohol abuse Father   . Diabetes Father   . Heart disease Father   . Hyperlipidemia Father   . Hypertension Father   . Cancer Sister   . Diabetes Sister   . Hyperlipidemia Sister   . Hypertension Sister   . Breast cancer Sister 41  . Hypertension Brother   . Alcohol abuse Maternal Aunt   . Alcohol abuse Maternal Uncle   . Hypertension Maternal Uncle   . Alcohol abuse Paternal Aunt   . Cancer Paternal Aunt   . Alcohol abuse Paternal Uncle   . Cancer Paternal Uncle   . Diabetes Maternal Grandmother   . Stroke Maternal Grandmother   . Heart disease Maternal Grandfather     SOCIAL HISTORY Social History   Tobacco Use  . Smoking status: Former Smoker    Quit date: 08/03/2015    Years since quitting: 5.1  . Smokeless tobacco: Former Network engineer  . Vaping Use: Never used  Substance Use Topics  . Alcohol use: Yes    Comment: 1 glass wine daily, beer occassional  . Drug use: No         OPHTHALMIC EXAM:  Base Eye Exam    Visual Acuity (Snellen - Linear)      Right Left   Dist cc 20/20 20/20 +   Correction: Glasses       Tonometry (Tonopen, 7:51 AM)      Right Left   Pressure 12 14       Pupils      Dark Light Shape React APD   Right 3 2 Round Brisk None   Left 3 2 Round Brisk None       Visual Fields (Counting fingers)      Left Right    Full Full       Extraocular Movement      Right Left    Full, Ortho Full, Ortho       Neuro/Psych    Oriented x3: Yes   Mood/Affect: Normal       Dilation    Both eyes: 1.0% Mydriacyl, 2.5% Phenylephrine @ 7:51 AM        Slit  Lamp and Fundus Exam    Slit Lamp Exam  Right Left   Lids/Lashes Dermatochalasis - upper lid Dermatochalasis - upper lid   Conjunctiva/Sclera nasal and temporal pinguecula nasal and temporal pinguecula   Cornea Trace Punctate epithelial erosions, trace tear film debris Trace Punctate epithelial erosions, trace tear film debris   Anterior Chamber Deep and quiet Deep and quiet   Iris Round and dilated, No NVI Round and dilated, No NVI   Lens 2-3+ Nuclear sclerosis, 2+ Cortical cataract 2-3+ Nuclear sclerosis, 2+ Cortical cataract   Vitreous Mild Vitreous syneresis Mild Vitreous syneresis       Fundus Exam      Right Left   Disc Pink and Sharp Pink and Sharp, Compact   C/D Ratio 0.3 0.2   Macula flat, blunted foveal reflex, mild RPE mottling and clumping, focal druse/pigment clump superonasal to fovea, No heme or edema Flat, Blunted foveal reflex, drusen, mild RPE mottling, clumping and early atrophy, +PEDs, No heme or edema   Vessels mild attenuation, mild tortuousity mild attenuation, mild tortuousity   Periphery Attached, focal peripheral drusen nasally, No heme  Attached, patch of pavingstone at 0600, No heme           IMAGING AND PROCEDURES  Imaging and Procedures for 11/16/17  OCT, Retina - OU - Both Eyes       Right Eye Quality was good. Central Foveal Thickness: 290. Progression has been stable. Findings include normal foveal contour, no IRF, no SRF, pigment epithelial detachment, outer retinal atrophy, subretinal hyper-reflective material, retinal drusen  (Focal PED/SRHM with overlying ORA -- stable from prior; prominent druse SN fovea).   Left Eye Quality was good. Central Foveal Thickness: 299. Progression has been stable. Findings include normal foveal contour, no IRF, no SRF, pigment epithelial detachment, intraretinal hyper-reflective material, retinal drusen , outer retinal atrophy (Scattered prominent drusen -- stable from prior; prominent SRHM inferior fovea --  ?mild progression of atrophy).   Notes *Images captured and stored on drive  Diagnosis / Impression: NFP, No IRF/SRF OU Drusen and PEDs OS>OD No DME OU   Clinical management:  See below  Abbreviations: NFP - Normal foveal profile. CME - cystoid macular edema. PED - pigment epithelial detachment. IRF - intraretinal fluid. SRF - subretinal fluid. EZ - ellipsoid zone. ERM - epiretinal membrane. ORA - outer retinal atrophy. ORT - outer retinal tubulation. SRHM - subretinal hyper-reflective material                  ASSESSMENT/PLAN:    ICD-10-CM   1. Intermediate stage nonexudative age-related macular degeneration of both eyes  H35.3132   2. Diabetes mellitus type 2 without retinopathy ([place])  E11.9   3. Retinal edema  H35.81 OCT, Retina - OU - Both Eyes  4. Combined forms of age-related cataract of both eyes  H25.813     1. Non-Exudative ARMD OU-   - Intermediate stage (OS > OD)  - scattered, prominent central drusen and PEDs OU (OS > OD)  - OCT with prominent drusen, stable PED with +hyperreflective material OU -- ?progression of atrophy OS  - continue amsler grid monitoring  - f/u 6 months for repeat DFE/OCT  2. Diabetes mellitus, type 2 without retinopathy  - The incidence, risk factors for progression, natural history and treatment options for diabetic retinopathy  were discussed with patient.    - The need for close monitoring of blood glucose, blood pressure, and serum lipids, avoiding cigarette or any type of tobacco, and the need for long term follow up was also discussed with  patient.  - f/u in 1 year, sooner prn   3. No retinal edema on exam or OCT  4. Combined form age-related cataract OU-  - The symptoms of cataract, surgical options, and treatments and risks were discussed with patient.  - discussed diagnosis and progression  - not yet visually significant  - monitor for now   Ophthalmic Meds Ordered this visit:  No orders of the defined types were  placed in this encounter.      Return in about 6 months (around 03/20/2021) for f/u non-exu ARMD OU, DFE, OCT.  There are no Patient Instructions on file for this visit.   Explained the diagnoses, plan, and follow up with the patient and they expressed understanding.  Patient expressed understanding of the importance of proper follow up care.   This document serves as a record of services personally performed by Gardiner Sleeper, MD, PhD. It was created on their behalf by San Jetty. Owens Shark, OA an ophthalmic technician. The creation of this record is the provider's dictation and/or activities during the visit.    Electronically signed by: San Jetty. Owens Shark, [place] 02.15.2022 8:49 AM   Gardiner Sleeper, M.D., Ph.D. Diseases & Surgery of the Retina and Vitreous [facility]  I have reviewed the above documentation for accuracy and completeness, and I agree with the above. Gardiner Sleeper, M.D., Ph.D. 09/20/20 8:49 AM   Abbreviations: M myopia (nearsighted); A astigmatism; H hyperopia (farsighted); P presbyopia; Mrx spectacle prescription;  CTL contact lenses; OD right eye; OS left eye; OU both eyes  XT exotropia; ET esotropia; PEK punctate epithelial keratitis; PEE punctate epithelial erosions; DES dry eye syndrome; MGD meibomian gland dysfunction; ATs artificial tears; PFAT's preservative free artificial tears; [place] nuclear sclerotic cataract; PSC posterior subcapsular cataract; ERM epi-retinal membrane; PVD posterior vitreous detachment; RD retinal detachment; DM diabetes mellitus; DR diabetic retinopathy; NPDR non-proliferative diabetic retinopathy; PDR proliferative diabetic retinopathy; CSME clinically significant macular edema; DME diabetic macular edema; dbh dot blot hemorrhages; CWS cotton wool spot; POAG primary open angle glaucoma; C/D cup-to-disc ratio; HVF humphrey visual field; GVF goldmann visual field; OCT optical coherence tomography; IOP intraocular pressure; BRVO  Branch retinal vein occlusion; CRVO central retinal vein occlusion; CRAO central retinal artery occlusion; BRAO branch retinal artery occlusion; RT retinal tear; SB scleral buckle; PPV pars plana vitrectomy; VH Vitreous hemorrhage; PRP panretinal laser photocoagulation; IVK intravitreal kenalog; VMT vitreomacular traction; MH Macular hole;  NVD neovascularization of the disc; NVE neovascularization elsewhere; AREDS age related eye disease study; ARMD age related macular degeneration; POAG primary open angle glaucoma; EBMD epithelial/anterior basement membrane dystrophy; ACIOL anterior chamber intraocular lens; IOL intraocular lens; PCIOL posterior chamber intraocular lens; Phaco/IOL phacoemulsification with intraocular lens placement; [place] photorefractive keratectomy; LASIK laser assisted in situ keratomileusis; HTN hypertension; DM diabetes mellitus; COPD chronic obstructive pulmonary disease

## 2020-09-18 ENCOUNTER — Other Ambulatory Visit: Payer: Self-pay

## 2020-09-18 ENCOUNTER — Ambulatory Visit (HOSPITAL_COMMUNITY)
Admission: RE | Admit: 2020-09-18 | Discharge: 2020-09-18 | Disposition: A | Discharge status: Home / Self Care | Payer: Medicare Other | Source: Ambulatory Visit | Attending: Family Medicine | Admitting: Family Medicine

## 2020-09-18 DIAGNOSIS — R221 Localized swelling, mass and lump, neck: Secondary | ICD-10-CM | POA: Insufficient documentation

## 2020-09-20 ENCOUNTER — Encounter (INDEPENDENT_AMBULATORY_CARE_PROVIDER_SITE_OTHER): Payer: Self-pay | Admitting: Ophthalmology

## 2020-09-20 ENCOUNTER — Other Ambulatory Visit: Payer: Self-pay

## 2020-09-20 ENCOUNTER — Ambulatory Visit (INDEPENDENT_AMBULATORY_CARE_PROVIDER_SITE_OTHER): Payer: Medicare Other | Admitting: Ophthalmology

## 2020-09-20 DIAGNOSIS — H353132 Nonexudative age-related macular degeneration, bilateral, intermediate dry stage: Secondary | ICD-10-CM

## 2020-09-20 DIAGNOSIS — H3581 Retinal edema: Secondary | ICD-10-CM | POA: Diagnosis not present

## 2020-09-20 DIAGNOSIS — H25813 Combined forms of age-related cataract, bilateral: Secondary | ICD-10-CM | POA: Diagnosis not present

## 2020-09-20 DIAGNOSIS — E119 Type 2 diabetes mellitus without complications: Secondary | ICD-10-CM | POA: Diagnosis not present

## 2020-09-20 DIAGNOSIS — H353131 Nonexudative age-related macular degeneration, bilateral, early dry stage: Secondary | ICD-10-CM

## 2020-09-25 ENCOUNTER — Other Ambulatory Visit: Payer: Self-pay

## 2020-09-25 DIAGNOSIS — R221 Localized swelling, mass and lump, neck: Secondary | ICD-10-CM

## 2020-10-22 ENCOUNTER — Ambulatory Visit (HOSPITAL_COMMUNITY)
Admission: RE | Admit: 2020-10-22 | Discharge: 2020-10-22 | Disposition: A | Discharge status: Home / Self Care | Payer: Medicare Other | Source: Ambulatory Visit | Attending: Family Medicine | Admitting: Family Medicine

## 2020-10-22 ENCOUNTER — Other Ambulatory Visit: Payer: Self-pay

## 2020-10-22 DIAGNOSIS — R221 Localized swelling, mass and lump, neck: Secondary | ICD-10-CM | POA: Insufficient documentation

## 2020-10-22 LAB — POCT I-STAT CREATININE: Creatinine, Ser: 0.6 mg/dL (ref 0.44–1.00)

## 2020-10-22 MED ORDER — IOHEXOL 300 MG/ML  SOLN
75.0000 mL | Freq: Once | INTRAMUSCULAR | Status: AC | PRN
  Administered 2020-10-22: 75 mL via INTRAVENOUS

## 2021-03-19 NOTE — Progress Notes (Signed)
[facility] Note  03/20/2021     CHIEF COMPLAINT Patient presents for Retina Follow Up   HISTORY OF PRESENT ILLNESS: Jeanne Shaw is a 65 y.o. female who presents to the clinic today for:   HPI     Retina Follow Up   Patient presents with  Dry AMD.  In both eyes.  Duration of 6 months.  Since onset it is stable.  I, the attending physician,  performed the HPI with the patient and updated documentation appropriately.        Comments   Pt here for 55mo ret f/u ARMD OU. Pt states vision is about the same, no changes. No ocular pain or discomfort. During acuity pt noticed OS seemed a bit filmy.      Last edited by Bernarda Caffey, MD on 03/20/2021  7:22 PM.     pt states vision is stable, no new health concerns  Referring physician: Alycia Rossetti, MD [address] [place],  [place] [id number]  HISTORICAL INFORMATION:   Selected notes from the [place] Referred by M. Dixon, PA-C for DM exam Ocular Hx-  PMH- DM (A1C 7.4 on 04.01.19), HTN, asthma,     CURRENT MEDICATIONS: No current outpatient medications on file. (Ophthalmic Drugs)   No current facility-administered medications for this visit. (Ophthalmic Drugs)   Current Outpatient Medications (Other)  Medication Sig   Ascorbic Acid (VITAMIN C) 1000 MG tablet Take 1,000 mg by mouth daily.   aspirin EC 81 MG tablet Take 81 mg by mouth daily.   Cholecalciferol (VITAMIN D) 125 MCG (5000 UT) CAPS Take 5,000 Units by mouth daily.    dapagliflozin propanediol (FARXIGA) 5 MG TABS tablet Take 1 tablet (5 mg total) by mouth daily before breakfast.   Flaxseed, Linseed, (FLAX SEEDS PO) Take 1 tablet by mouth daily.    lisinopril (ZESTRIL) 10 MG tablet Take 1 tablet (10 mg total) by mouth daily.   meclizine (ANTIVERT) 12.5 MG tablet Take 1 tablet (12.5 mg total) by mouth 3 (three) times daily as needed for dizziness.   metFORMIN (GLUCOPHAGE) 1000 MG tablet  TAKE 1 TABLET BY MOUTH TWICE DAILY WITH A MEAL   MILK THISTLE PO Take 1 tablet by mouth at bedtime.    Multiple Vitamins-Minerals (PRESERVISION AREDS) CAPS Take 1 capsule by mouth at bedtime.    Omega-3 Fatty Acids (FISH OIL) 1000 MG CAPS Take 1 capsule (1,000 mg total) by mouth daily. (Patient taking differently: Take 1,000 mg by mouth daily.)   omeprazole (PRILOSEC) 40 MG capsule Take 1 capsule (40 mg total) by mouth daily.   Red Yeast Rice Extract (RED YEAST RICE PO) Take 1 tablet by mouth daily.    simvastatin (ZOCOR) 10 MG tablet Take 1 tablet (10 mg total) by mouth at bedtime.   sitaGLIPtin (JANUVIA) 100 MG tablet Take 1 tablet (100 mg total) by mouth daily.   sodium chloride (OCEAN) 0.65 % SOLN nasal spray Place 1 spray into both nostrils daily as needed for congestion.   Zinc 50 MG CAPS    No current facility-administered medications for this visit. (Other)   REVIEW OF SYSTEMS: ROS   Positive for: Skin, Endocrine, Eyes Negative for: Constitutional, Gastrointestinal, Neurological, Genitourinary, Musculoskeletal, HENT, Cardiovascular, Respiratory, Psychiatric, Allergic/Imm, Heme/Lymph Last edited by Kingsley Spittle, COT on 03/20/2021  8:00 AM.     ALLERGIES Allergies  Allergen Reactions   Hydrocodone Anxiety    PAST MEDICAL HISTORY Past  Medical History:  Diagnosis Date   Allergy    Anxiety    Asthma    Cataract    OU   Diabetes mellitus without complication ([place])    GERD (gastroesophageal reflux disease)    Hypertension    Macular degeneration    Dry OU   Past Surgical History:  Procedure Laterality Date   BIOPSY  03/26/2020   Procedure: BIOPSY;  Surgeon: Harvel Quale, MD;  [facility]: [facility];  Service: Gastroenterology;;   COLONOSCOPY WITH PROPOFOL N/A 03/26/2020   Procedure: COLONOSCOPY WITH PROPOFOL;  Surgeon: Harvel Quale, MD;  Location: [facility];  Service: Gastroenterology;  Laterality: N/A;  815   CYST EXCISION     off  vocal cord    POLYPECTOMY  03/26/2020   Procedure: POLYPECTOMY;  Surgeon: Montez Morita, Quillian Quince, MD;  Location: [facility];  Service: Gastroenterology;;    FAMILY HISTORY Family History  Problem Relation Age of Onset   COPD Mother    Alcohol abuse Father    Diabetes Father    Heart disease Father    Hyperlipidemia Father    Hypertension Father    Cancer Sister    Diabetes Sister    Hyperlipidemia Sister    Hypertension Sister    Breast cancer Sister 17   Hypertension Brother    Alcohol abuse Maternal Aunt    Alcohol abuse Maternal Uncle    Hypertension Maternal Uncle    Alcohol abuse Paternal Aunt    Cancer Paternal Aunt    Alcohol abuse Paternal Uncle    Cancer Paternal Uncle    Diabetes Maternal Grandmother    Stroke Maternal Grandmother    Heart disease Maternal Grandfather     SOCIAL HISTORY Social History   Tobacco Use   Smoking status: Former    Types: Cigarettes    Quit date: 08/03/2015    Years since quitting: 5.6   Smokeless tobacco: Former  Scientific laboratory technician Use: Never used  Substance Use Topics   Alcohol use: Yes    Comment: 1 glass wine daily, beer occassional   Drug use: No         OPHTHALMIC EXAM:  Base Eye Exam     Visual Acuity (Snellen - Linear)       Right Left   Dist cc 20/25 -2 20/30 -2   Dist ph cc 20/20 -2 20/25 -2    Correction: Glasses         Tonometry (Tonopen, 8:12 AM)       Right Left   Pressure 16 16         Pupils       Dark Light Shape React APD   Right 3 2 Round Brisk None   Left 3 2 Round Brisk None         Visual Fields (Counting fingers)       Left Right    Full Full         Extraocular Movement       Right Left    Full, Ortho Full, Ortho         Neuro/Psych     Oriented x3: Yes   Mood/Affect: Normal         Dilation     Both eyes: 1.0% Mydriacyl, 2.5% Phenylephrine @ 8:13 AM           Slit Lamp and Fundus Exam     Slit Lamp Exam       Right  Left    Lids/Lashes Dermatochalasis - upper lid Dermatochalasis - upper lid   Conjunctiva/Sclera nasal and temporal pinguecula nasal and temporal pinguecula   Cornea Trace Punctate epithelial erosions, trace tear film debris Trace Punctate epithelial erosions, trace tear film debris   Anterior Chamber Deep and quiet Deep and quiet   Iris Round and dilated, No NVI Round and dilated, No NVI   Lens 2-3+ Nuclear sclerosis, 2-3+ Cortical cataract 2-3+ Nuclear sclerosis, 2-3+ Cortical cataract   Vitreous Mild Vitreous syneresis Mild Vitreous syneresis         Fundus Exam       Right Left   Disc Pink and Sharp Pink and Sharp, Compact   C/D Ratio 0.3 0.2   Macula flat, blunted foveal reflex, mild RPE mottling and clumping, focal druse/pigment clump superonasal to fovea, No heme or edema Flat, Blunted foveal reflex, drusen, mild RPE mottling, clumping and early atrophy, +PEDs, No heme or edema   Vessels attenuated, Tortuous, AV crossing changes, mild Copper wiring attenuated, mild tortuousity   Periphery Attached, focal peripheral drusen nasally, reticular degeneration, No heme  Attached, patch of pavingstone at 0600, mild reticular degeneration, No heme            Refraction     Wearing Rx       Sphere Cylinder Axis Add   Right +1.00 +1.50 175 +2.25   Left +1.25 +1.25 018 +2.25    Type: PAL         Manifest Refraction       Sphere Cylinder Axis Dist VA   Right +1.00 +2.00 175 20/20-1   Left +1.25 +1.25 015 20/30+2            IMAGING AND PROCEDURES  Imaging and Procedures for 11/16/17  OCT, Retina - OU - Both Eyes       Right Eye Quality was good. Central Foveal Thickness: 286. Progression has been stable. Findings include normal foveal contour, no IRF, no SRF, pigment epithelial detachment, outer retinal atrophy, subretinal hyper-reflective material, retinal drusen (Focal PED/SRHM with overlying ORA -- stable from prior; prominent druse SN fovea).   Left Eye Quality was  good. Central Foveal Thickness: 311. Progression has been stable. Findings include normal foveal contour, no IRF, no SRF, pigment epithelial detachment, intraretinal hyper-reflective material, retinal drusen , outer retinal atrophy (Scattered prominent drusen -- stable from prior; prominent SRHM inferior fovea -- ?mild progression of atrophy).   Notes *Images captured and stored on drive  Diagnosis / Impression: NFP, No IRF/SRF OU Drusen and PEDs OS>OD No DME OU  OD: Focal PED/SRHM with overlying ORA -- stable from prior; prominent druse SN fovea OS: Scattered prominent drusen -- stable from prior; prominent SRHM inferior fovea -- ?mild progression of atrophy  Clinical management:  See below  Abbreviations: NFP - Normal foveal profile. CME - cystoid macular edema. PED - pigment epithelial detachment. IRF - intraretinal fluid. SRF - subretinal fluid. EZ - ellipsoid zone. ERM - epiretinal membrane. ORA - outer retinal atrophy. ORT - outer retinal tubulation. SRHM - subretinal hyper-reflective material                ASSESSMENT/PLAN:    ICD-10-CM   1. Intermediate stage nonexudative age-related macular degeneration of both eyes  H35.3132     2. 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 Retinal edema  H35.81 OCT, Retina - OU - Both Eyes    4. Essential hypertension  I10  5. Hypertensive retinopathy of both eyes  H35.033     6. Combined forms of age-related cataract of both eyes  H25.813      1,2. Non-Exudative ARMD OU-   - Intermediate stage (OS > OD)  - scattered, prominent central drusen and PEDs OU (OS > OD)  - OCT with prominent drusen, stable PED with +hyperreflective material OU -- ?progression of atrophy OS  - continue amsler grid monitoring  - f/u 6 months for repeat DFE/OCT  3. Diabetes mellitus, type 2 without retinopathy  - The incidence, risk factors for progression, natural history and treatment options for diabetic retinopathy   were discussed with patient.    - The need for close monitoring of blood glucose, blood pressure, and serum lipids, avoiding cigarette or any type of tobacco, and the need for long term follow up was also discussed with patient.  - f/u in 1 year, sooner, prn   4,5. Hypertensive retinopathy OU - discussed importance of tight BP control - monitor  6. Combined form age-related cataract OU-  - The symptoms of cataract, surgical options, and treatments and risks were discussed with patient.  - discussed diagnosis and progression  - not yet visually significant  - monitor for now  Ophthalmic Meds Ordered this visit:  No orders of the defined types were placed in this encounter.     Return in about 6 months (around 09/20/2021) for f/u non-exu ARMD OU, DFE, OCT.  There are no Patient Instructions on file for this visit.   Explained the diagnoses, plan, and follow up with the patient and they expressed understanding.  Patient expressed understanding of the importance of proper follow up care.   This document serves as a record of services personally performed by Gardiner Sleeper, MD, PhD. It was created on their behalf by Leonie Douglas, an ophthalmic technician. The creation of this record is the provider's dictation and/or activities during the visit.    Electronically signed by: Leonie Douglas COA, 03/20/21  7:29 PM  This document serves as a record of services personally performed by Gardiner Sleeper, MD, PhD. It was created on their behalf by San Jetty. Owens Shark, OA an ophthalmic technician. The creation of this record is the provider's dictation and/or activities during the visit.    Electronically signed by: San Jetty. Marguerita Merles 08.18.2022 7:29 PM   Gardiner Sleeper, M.D., Ph.D. Diseases & Surgery of the Retina and [place] 03/20/2021  I have reviewed the above documentation for accuracy and completeness, and I agree with the above. Gardiner Sleeper, M.D.,  Ph.D. 03/20/21 7:29 PM   Abbreviations: M myopia (nearsighted); A astigmatism; H hyperopia (farsighted); P presbyopia; Mrx spectacle prescription;  CTL contact lenses; OD right eye; OS left eye; OU both eyes  XT exotropia; ET esotropia; PEK punctate epithelial keratitis; PEE punctate epithelial erosions; DES dry eye syndrome; MGD meibomian gland dysfunction; ATs artificial tears; PFAT's preservative free artificial tears; [place] nuclear sclerotic cataract; PSC posterior subcapsular cataract; ERM epi-retinal membrane; PVD posterior vitreous detachment; RD retinal detachment; DM diabetes mellitus; DR diabetic retinopathy; NPDR non-proliferative diabetic retinopathy; PDR proliferative diabetic retinopathy; CSME clinically significant macular edema; DME diabetic macular edema; dbh dot blot hemorrhages; CWS cotton wool spot; POAG primary open angle glaucoma; C/D cup-to-disc ratio; HVF humphrey visual field; GVF goldmann visual field; OCT optical coherence tomography; IOP intraocular pressure; BRVO Branch retinal vein occlusion; CRVO central retinal vein occlusion; CRAO central retinal artery occlusion; BRAO branch retinal artery  occlusion; RT retinal tear; SB scleral buckle; PPV pars plana vitrectomy; VH Vitreous hemorrhage; PRP panretinal laser photocoagulation; IVK intravitreal kenalog; VMT vitreomacular traction; MH Macular hole;  NVD neovascularization of the disc; NVE neovascularization elsewhere; AREDS age related eye disease study; ARMD age related macular degeneration; POAG primary open angle glaucoma; EBMD epithelial/anterior basement membrane dystrophy; ACIOL anterior chamber intraocular lens; IOL intraocular lens; PCIOL posterior chamber intraocular lens; Phaco/IOL phacoemulsification with intraocular lens placement; [place] photorefractive keratectomy; LASIK laser assisted in situ keratomileusis; HTN hypertension; DM diabetes mellitus; COPD chronic obstructive pulmonary disease

## 2021-03-20 ENCOUNTER — Ambulatory Visit (INDEPENDENT_AMBULATORY_CARE_PROVIDER_SITE_OTHER): Payer: Medicare Other | Admitting: Ophthalmology

## 2021-03-20 ENCOUNTER — Encounter (INDEPENDENT_AMBULATORY_CARE_PROVIDER_SITE_OTHER): Payer: Self-pay | Admitting: Ophthalmology

## 2021-03-20 ENCOUNTER — Other Ambulatory Visit: Payer: Self-pay

## 2021-03-20 DIAGNOSIS — H35033 Hypertensive retinopathy, bilateral: Secondary | ICD-10-CM

## 2021-03-20 DIAGNOSIS — I10 Essential (primary) hypertension: Secondary | ICD-10-CM | POA: Diagnosis not present

## 2021-03-20 DIAGNOSIS — H353132 Nonexudative age-related macular degeneration, bilateral, intermediate dry stage: Secondary | ICD-10-CM

## 2021-03-20 DIAGNOSIS — H3581 Retinal edema: Secondary | ICD-10-CM

## 2021-03-20 DIAGNOSIS — E119 Type 2 diabetes mellitus without complications: Secondary | ICD-10-CM

## 2021-03-20 DIAGNOSIS — H25813 Combined forms of age-related cataract, bilateral: Secondary | ICD-10-CM

## 2021-04-25 ENCOUNTER — Other Ambulatory Visit: Payer: Self-pay

## 2021-04-25 ENCOUNTER — Encounter (HOSPITAL_BASED_OUTPATIENT_CLINIC_OR_DEPARTMENT_OTHER): Payer: Self-pay | Admitting: Nurse Practitioner

## 2021-04-25 ENCOUNTER — Ambulatory Visit (INDEPENDENT_AMBULATORY_CARE_PROVIDER_SITE_OTHER): Payer: Medicare Other | Admitting: Nurse Practitioner

## 2021-04-25 VITALS — BP 143/68 | HR 78 | Ht 61.0 in | Wt 172.6 lb

## 2021-04-25 DIAGNOSIS — N761 Subacute and chronic vaginitis: Secondary | ICD-10-CM | POA: Insufficient documentation

## 2021-04-25 DIAGNOSIS — M79605 Pain in left leg: Secondary | ICD-10-CM

## 2021-04-25 DIAGNOSIS — Z0000 Encounter for general adult medical examination without abnormal findings: Secondary | ICD-10-CM | POA: Diagnosis not present

## 2021-04-25 DIAGNOSIS — E785 Hyperlipidemia, unspecified: Secondary | ICD-10-CM

## 2021-04-25 DIAGNOSIS — I10 Essential (primary) hypertension: Secondary | ICD-10-CM

## 2021-04-25 DIAGNOSIS — E669 Obesity, unspecified: Secondary | ICD-10-CM

## 2021-04-25 DIAGNOSIS — E118 Type 2 diabetes mellitus with unspecified complications: Secondary | ICD-10-CM

## 2021-04-25 HISTORY — DX: Encounter for general adult medical examination without abnormal findings: Z00.00

## 2021-04-25 HISTORY — DX: Subacute and chronic vaginitis: N76.1

## 2021-04-25 HISTORY — DX: Pain in left leg: M79.605

## 2021-04-25 MED ORDER — GLIPIZIDE 5 MG PO TABS: 2.5000 mg | ORAL_TABLET | Freq: Every day | ORAL | 3 refills | Status: DC

## 2021-04-25 NOTE — Assessment & Plan Note (Signed)
Labs today Continue current medications.  Labs to be reviewed and changes made to plan of care as appropriate once resulted.

## 2021-04-25 NOTE — Assessment & Plan Note (Signed)
Symptoms consistent with bakers cyst.  No abnormalities appreciable on examination today Recommend stretching, ice and heat to the affected area Discussed if symptoms worsen or fail to improve we can do imaging Patient agreeable.  Information provided on handout

## 2021-04-25 NOTE — Patient Instructions (Addendum)
Recommendations from today's visit: If the pain behind your knee gets any worse, please let us know. Dr. Burnard Bunting is able to inject steroid into this if it becomes more problematic.  I recommend ice and heat to the area when it is bothering you. You can also use ibuprofen or tylenol to help with the pain. I do recommend the gentle stretches and rest when it is bothering you more.  We will start the glipizide - you can take this about 15-30 minutes before your largest meal of the day to help prevent low blood sugar. Please let me know if your blood sugars are running too high still or running low (average in the 70's80's) after starting this and we will make changes.   Information on diet, exercise, and health maintenance recommendations are listed below. This is information to help you be sure you are on track for optimal health and monitoring.   Please look over this and let us know if you have any questions or if you have completed any of the health maintenance outside of [facility] so that we can be sure your records are up to date.  ___________________________________________________________  Thank you for choosing [facility] for your Primary Care needs. I am excited for the opportunity to partner with you to meet your health care goals. It was a pleasure meeting you today!  I am an Adult-Geriatric Nurse Practitioner with a background in caring for patients for more than 20 years. I provide primary care and sports medicine services to patients age 52 and older within this office. I am also the director of the APP Fellowship with [facility].   I am passionate about providing the best service to you through preventive medicine and supportive care. I consider you a part of the medical team and value your input. I work diligently to ensure that you are heard and your needs are met in a safe and effective manner. I want you to feel comfortable with me as your  provider and want you to know that your health concerns are important to me.  For your information, our office hours are Monday- Friday 8:00 AM - 5:00 PM At this time I am not in the office on Wednesdays.  If you have questions or concerns, please call our office at [phone] or send [place] a MyChart message and we will respond as quickly as possible.   For all urgent or time sensitive needs we ask that you please call the office to avoid delays. MyChart is not constantly monitored and replies may take up to 72 business hours.  MyChart Policy: MyChart allows for you to see your visit notes, after visit summary, provider recommendations, lab and tests results, make an appointment, request refills, and contact your provider or the office for non-urgent questions or concerns. Providers are seeing patients during normal business hours and do not have built in time to review MyChart messages.  We ask that you allow a minimum of 4 business days for responses to [employer]. For this reason, please do not send urgent requests through [place]. Please call the office at [phone]. Complex MyChart concerns may require a visit. Your provider may request you schedule a virtual or in person visit to ensure we are providing the best care possible. MyChart messages sent after 4:00 PM on Friday will not be received by the provider until Monday morning.    Lab and Test Results: You will receive your lab and test  results on MyChart as soon as they are completed and results have been sent by the lab or testing facility. Due to this service, you will receive your results BEFORE your provider.  I review lab and tests results each morning prior to seeing patients. Some results require collaboration with other providers to ensure you are receiving the most appropriate care. For this reason, we ask that you please allow a minimum of 4 business days for your provider to receive and review lab and test results and  contact you about these.  Most lab and test result comments from the provider will be sent through [place]. Your provider may recommend changes to the plan of care, follow-up visits, repeat testing, ask questions, or request an office visit to discuss these results. You may reply directly to this message or call the office at [phone] to provide information for the provider or set up an appointment. In some instances, you will be called with test results and recommendations. Please let us know if this is preferred and we will make note of this in your chart to provide this for you.    If you have not heard a response to your lab or test results in 72 business hours, please call the office to let us know.   After Hours: For all non-emergency after hours needs, please call the office at [phone] and select the option to reach the on-call provider service. On-call services are shared between multiple [facility] offices and therefore it will not be possible to speak directly with your provider. On-call providers may provide medical advice and recommendations, but are unable to provide refills for maintenance medications.  For all emergency or urgent medical needs after normal business hours, we recommend that you seek care at the closest Urgent Care or Emergency Department to ensure appropriate treatment in a timely manner.  [facility] [place] has a 24 hour emergency room located on the ground floor for your convenience.    Please do not hesitate to reach out to [place] with concerns.   Thank you, again, for choosing me as your health care partner. I appreciate your trust and look forward to learning more about you.   [name], DNP, AGNP-c ___________________________________________________________  Health Maintenance Recommendations Screening Testing Mammogram Every 1 -2 years based on history and risk factors Starting at age 43 Pap Smear Ages 21-39 every 3 years Ages  10-65 every 5 years with HPV testing More frequent testing may be required based on results and history Colon Cancer Screening Every 1-10 years based on test performed, risk factors, and history Starting at age 63 Bone Density Screening Every 2-10 years based on history Starting at age 39 for women Recommendations for men differ based on medication usage, history, and risk factors AAA Screening One time ultrasound Men 29-60 years old who have every smoked Lung Cancer Screening Low Dose Lung CT every 12 months Age 16-80 years with a 30 pack-year smoking history who still smoke or who have quit within the last 15 years  Screening Labs Routine  Labs: Complete Blood Count (CBC), Complete Metabolic Panel (CMP), Cholesterol (Lipid Panel) Every 6-12 months based on history and medications May be recommended more frequently based on current conditions or previous results Hemoglobin A1c Lab Every 3-12 months based on history and previous results Starting at age 51 or earlier with diagnosis of diabetes, high cholesterol, BMI >26, and/or risk factors Frequent monitoring for patients with diabetes to ensure blood sugar control Thyroid Panel (  TSH w/ T3 & T4) Every 6 months based on history, symptoms, and risk factors May be repeated more often if on medication HIV One time testing for all patients 21 and older May be repeated more frequently for patients with increased risk factors or exposure Hepatitis C One time testing for all patients 67 and older May be repeated more frequently for patients with increased risk factors or exposure Gonorrhea, Chlamydia Every 12 months for all sexually active persons 13-24 years Additional monitoring may be recommended for those who are considered high risk or who have symptoms PSA Men 31-31 years old with risk factors Additional screening may be recommended from age 41-69 based on risk factors, symptoms, and history  Vaccine Recommendations Tetanus  Booster All adults every 10 years Flu Vaccine All patients 6 months and older every year COVID Vaccine All patients 12 years and older Initial dosing with booster May recommend additional booster based on age and health history HPV Vaccine 2 doses all patients age 54-26 Dosing may be considered for patients over 26 Shingles Vaccine (Shingrix) 2 doses all adults 50 years and older Pneumonia (Pneumovax 23) All adults 70 years and older May recommend earlier dosing based on health history Pneumonia (Prevnar 81) All adults 34 years and older Dosed 1 year after Pneumovax 23  Additional Screening, Testing, and Vaccinations may be recommended on an individualized basis based on family history, health history, risk factors, and/or exposure.  __________________________________________________________  Diet Recommendations for All Patients  I recommend that all patients maintain a diet low in saturated fats, carbohydrates, and cholesterol. While this can be challenging at first, it is not impossible and small changes can make big differences.  Things to try: Decreasing the amount of soda, sweet tea, and/or juice to one or less per day and replace with water While water is always the first choice, if you do not like water you may consider adding a water additive without sugar to improve the taste other sugar free drinks Replace potatoes with a brightly colored vegetable at dinner Use healthy oils, such as canola oil or olive oil, instead of butter or hard margarine Limit your bread intake to two pieces or less a day Replace regular pasta with low carb pasta options Bake, broil, or grill foods instead of frying Monitor portion sizes  Eat smaller, more frequent meals throughout the day instead of large meals  An important thing to remember is, if you love foods that are not great for your health, you don't have to give them up completely. Instead, allow these foods to be a reward when you  have done well. Allowing yourself to still have special treats every once in a while is a nice way to tell yourself thank you for working hard to keep yourself healthy.   Also remember that every day is a new day. If you have a bad day and "fall off the wagon", you can still climb right back up and keep moving along on your journey!  We have resources available to help you!  Some websites that may be helpful include: www.http://carter.biz/  Www.VeryWellFit.com _____________________________________________________________  Activity Recommendations for All Patients  I recommend that all adults get at least 20 minutes of moderate physical activity that elevates your heart rate at least 5 days out of the week.  Some examples include: Walking or jogging at a pace that allows you to carry on a conversation Cycling (stationary bike or outdoors) Water aerobics Yoga Weight lifting Dancing If physical limitations prevent  you from putting stress on your joints, exercise in a pool or seated in a chair are excellent options.  Do determine your MAXIMUM heart rate for activity: YOUR AGE - 220 = MAX HeartRate   Remember! Do not push yourself too hard.  Start slowly and build up your pace, speed, weight, time in exercise, etc.  Allow your body to rest between exercise and get good sleep. You will need more water than normal when you are exerting yourself. Do not wait until you are thirsty to drink. Drink with a purpose of getting in at least 8, 8 ounce glasses of water a day plus more depending on how much you exercise and sweat.    If you begin to develop dizziness, chest pain, abdominal pain, jaw pain, shortness of breath, headache, vision changes, lightheadedness, or other concerning symptoms, stop the activity and allow your body to rest. If your symptoms are severe, seek emergency evaluation immediately. If your symptoms are concerning, but not severe, please let us know so that we can recommend further  evaluation.   ________________________________________________________________

## 2021-04-25 NOTE — Assessment & Plan Note (Signed)
Review of current and past medical history, social history, medication, and family history.  Review of care gaps and health maintenance recommendations.  Records from recent providers to be requested if not available in Chart Review or Care Everywhere.  Recommendations for health maintenance, diet, and exercise provided.  Labs today: cbc, cmp, lipids, a1c, urine HM Recommendations: obtain records for completed items CPE due: will review records for determination

## 2021-04-25 NOTE — Assessment & Plan Note (Signed)
BP is elevated today Recommend monitoring at home and report readings >130/85 for recommendations No red flags.  F/U with CPE in near future

## 2021-04-25 NOTE — Assessment & Plan Note (Signed)
Maintaining Exercise and activity information provided F/U in 4 months or sooner if needed

## 2021-04-25 NOTE — Assessment & Plan Note (Signed)
Due to farxiga Recommend stopping medication No symptoms present today

## 2021-04-25 NOTE — Progress Notes (Signed)
[name], DNP, AGNP-c [facility] [address]  [place], [place] [address] [phone]  New patient visit   Patient: Jeanne Shaw   DOB: [birth year]   65 y.o. Female  MRN: [id number] Visit Date: 04/25/2021  Patient Care Team: Shanesha Bednarz, Coralee Pesa, NP as PCP - General (Nurse Practitioner)  Today's healthcare provider: [name], NP   Chief Complaint  Patient presents with   Establish Care    Patient states her left calf feels like it has been strained; however, she not done anything to strain it.  Pain level is 2.  Patient states she has an allergic reaction (severe itching in genital area) to [place].   Subjective    Pearlena Ow is a 65 y.o. female who presents today as a new patient to establish care.  HPI HPI     Establish Care    Additional comments: Patient states her left calf feels like it has been strained; however, she not done anything to strain it.  Pain level is 2.  Patient states she has an allergic reaction (severe itching in genital area) to [place].      Last edited by Noel Gerold, CMA on 04/25/2021  9:48 AM.      Popliteal Pain About a month ago started. Comes and goes.  No injury that she knows of. No redness, irritation, or swelling.  Reports that it feels "strained" behind the knee and just distal to the popliteal fossa Nothing seems to make it better or worse.  Occasionally notices a "lump" behind the knee when it is more painful.  Pain 2/10 at worst  Diabetes Has been on Farxiga, metformin, Januvia.  Checks BG "on occasion" When on all three medications was in 120's fasting.  Has stopped [place] on consistent basis about 6 weeks ago due to genital itching. Has started taking only one to two times per week to help blood sugar. Feet have felt cold over the past several weeks- typically in the evenings. No numbness, tingling, or pain No vision changes, CP, ShOB.   Increased hunger and increased thirst "come and goes". No regular increased urination. Does report getting up 2-3 times in the middle of the night to urinate.   HTN Taking lisinopril Checks BP at home on occasion Normally runs low 130's/70's No new headaches associated with BP, vision changes, dizziness.   HLD Taking simvastatin - has been on this for 8-10 years.  No issues  Elevated liver enzymes "Have been that way for years" Has had two scans of the liver and "nothing showed up" No abdominal pain, nausea, vomiting, or back pain.   Past Medical History:  Diagnosis Date   Allergy    Anxiety    Asthma    Cataract    OU   Diabetes mellitus without complication ([place])    GERD (gastroesophageal reflux disease)    Hypertension    Macular degeneration    Dry OU   Past Surgical History:  Procedure Laterality Date   BIOPSY  03/26/2020   Procedure: BIOPSY;  Surgeon: Harvel Quale, MD;  [facility];  Service: Gastroenterology;;   COLONOSCOPY WITH PROPOFOL N/A 03/26/2020   Procedure: COLONOSCOPY WITH PROPOFOL;  Surgeon: Harvel Quale, MD;  Location: [facility];  Service: Gastroenterology;  Laterality: N/A;  815   CYST EXCISION     off vocal cord    POLYPECTOMY  03/26/2020   Procedure: POLYPECTOMY;  Surgeon:  Harvel Quale, MD;  [facility]: [facility];  Service: Gastroenterology;;   Family Status  Relation Name Status   Mother  Deceased   Father  Deceased   Sister  (Not Specified)       breast   Brother  (Not Specified)   Mat Aunt  (Not Specified)   Mat Uncle  (Not Specified)   Ethlyn Daniels  (Not Specified)       lung cancer   Annamarie Major  (Not Specified)       lung cancer   MGM  (Not Specified)       cervical cancer   MGF  (Not Specified)   Family History  Problem Relation Age of Onset   COPD Mother    Alcohol abuse Father    Diabetes Father    Heart disease Father    Hyperlipidemia Father    Hypertension Father     Cancer Sister    Diabetes Sister    Hyperlipidemia Sister    Hypertension Sister    Breast cancer Sister 25   Hypertension Brother    Alcohol abuse Maternal Aunt    Alcohol abuse Maternal Uncle    Hypertension Maternal Uncle    Alcohol abuse Paternal Aunt    Cancer Paternal Aunt    Alcohol abuse Paternal Uncle    Cancer Paternal Uncle    Diabetes Maternal Grandmother    Stroke Maternal Grandmother    Heart disease Maternal Grandfather    Social History   Socioeconomic History   Marital status: Married    Spouse name: Not on file   Number of children: Not on file   Years of education: Not on file   Highest education level: Not on file  Occupational History   Not on file  Tobacco Use   Smoking status: Former    Types: Cigarettes    Quit date: 08/03/2015    Years since quitting: 5.7   Smokeless tobacco: Former  Scientific laboratory technician Use: Never used  Substance and Sexual Activity   Alcohol use: Yes    Alcohol/week: 12.0 standard drinks    Types: 12 Glasses of wine per week    Comment: beer occassionally   Drug use: No   Sexual activity: Yes  Other Topics Concern   Not on file  Social History Narrative   Not on file   Social Determinants of Health   Financial Resource Strain: Not on file  Food Insecurity: Not on file  Transportation Needs: Not on file  Physical Activity: Not on file  Stress: Not on file  Social Connections: Not on file   Outpatient Medications Prior to Visit  Medication Sig   Ascorbic Acid (VITAMIN C) 1000 MG tablet Take 1,000 mg by mouth daily.   aspirin EC 81 MG tablet Take 81 mg by mouth daily.   Cholecalciferol (VITAMIN D) 125 MCG (5000 UT) CAPS Take 5,000 Units by mouth daily.    Flaxseed, Linseed, (FLAX SEEDS PO) Take 1 tablet by mouth daily.    lisinopril (ZESTRIL) 10 MG tablet Take 1 tablet (10 mg total) by mouth daily.   meclizine (ANTIVERT) 12.5 MG tablet Take 1 tablet (12.5 mg total) by mouth 3 (three) times daily as needed for  dizziness.   metFORMIN (GLUCOPHAGE) 1000 MG tablet TAKE 1 TABLET BY MOUTH TWICE DAILY WITH A MEAL   MILK THISTLE PO Take 1 tablet by mouth at bedtime.    Multiple Vitamins-Minerals (PRESERVISION AREDS) CAPS Take 1 capsule by mouth  at bedtime.    Omega-3 Fatty Acids (FISH OIL) 1000 MG CAPS Take 1 capsule (1,000 mg total) by mouth daily. (Patient taking differently: Take 1,000 mg by mouth daily.)   omeprazole (PRILOSEC) 40 MG capsule Take 1 capsule (40 mg total) by mouth daily.   Red Yeast Rice Extract (RED YEAST RICE PO) Take 1 tablet by mouth daily.    simvastatin (ZOCOR) 10 MG tablet Take 1 tablet (10 mg total) by mouth at bedtime.   sitaGLIPtin (JANUVIA) 100 MG tablet Take 1 tablet (100 mg total) by mouth daily.   sodium chloride (OCEAN) 0.65 % SOLN nasal spray Place 1 spray into both nostrils daily as needed for congestion.   Zinc 50 MG CAPS    dapagliflozin propanediol (FARXIGA) 5 MG TABS tablet Take 1 tablet (5 mg total) by mouth daily before breakfast.   No facility-administered medications prior to visit.   Allergies  Allergen Reactions   Hydrocodone Anxiety    Immunization History  Administered Date(s) Administered   Hepatitis B 10/25/2015, 11/26/2015, 07/01/2016   Pneumococcal Polysaccharide-23 07/15/2020   Tdap 03/27/2011, 03/07/2019   Zoster Recombinat (Shingrix) 04/05/2015    Health Maintenance  Topic Date Due   COVID-19 Vaccine (1) Never done   Zoster Vaccines- Shingrix (2 of 2) 05/31/2015   DEXA SCAN  Never done   HEMOGLOBIN A1C  01/13/2021   OPHTHALMOLOGY EXAM  03/20/2021   INFLUENZA VACCINE  08/03/2028 (Originally 03/03/2021)   FOOT EXAM  07/15/2021   MAMMOGRAM  07/30/2021   COLONOSCOPY (Pts 45-59yrs Insurance coverage will need to be confirmed)  03/26/2025   TETANUS/TDAP  03/06/2029   Hepatitis C Screening  Completed   HPV VACCINES  Aged Out    Patient Care Team: Marquavis Hannen, Coralee Pesa, NP as PCP - General (Nurse Practitioner)  Review of Systems All review of  systems negative except what is listed in the HPI    Objective    BP (!) 143/68   Pulse 78   Ht 5\' 1"  (1.549 m)   Wt 172 lb 9.6 oz (78.3 kg)   SpO2 100%   BMI 32.61 kg/m  Physical Exam Vitals and nursing note reviewed.  Constitutional:      Appearance: Normal appearance. She is obese.  HENT:     Head: Normocephalic.  Eyes:     Extraocular Movements: Extraocular movements intact.     Conjunctiva/sclera: Conjunctivae normal.     Pupils: Pupils are equal, round, and reactive to light.  Cardiovascular:     Rate and Rhythm: Normal rate and regular rhythm.     Pulses: Normal pulses.     Heart sounds: Normal heart sounds. No murmur heard. Pulmonary:     Effort: Pulmonary effort is normal. No respiratory distress.     Breath sounds: Normal breath sounds.  Abdominal:     General: Bowel sounds are normal.     Palpations: Abdomen is soft.     Tenderness: There is no abdominal tenderness. There is no right CVA tenderness, left CVA tenderness or guarding.  Musculoskeletal:        General: Tenderness present. Normal range of motion.     Cervical back: Normal range of motion.     Right lower leg: No edema.     Left lower leg: No edema.       Legs:  Skin:    General: Skin is warm and dry.     Capillary Refill: Capillary refill takes less than 2 seconds.  Neurological:     General:  No focal deficit present.     Mental Status: She is alert and oriented to person, place, and time.     Cranial Nerves: No cranial nerve deficit.     Sensory: No sensory deficit.     Motor: No weakness.     Coordination: Coordination normal.     Gait: Gait normal.  Psychiatric:        Mood and Affect: Mood normal.        Behavior: Behavior normal.        Thought Content: Thought content normal.        Judgment: Judgment normal.    Depression Screen PHQ 2/9 Scores 04/25/2021 07/15/2020 01/19/2020 03/07/2019  PHQ - 2 Score 0 0 0 0  PHQ- 9 Score 0 - 0 -   No results found for any visits on 04/25/21.   Assessment & Plan      Problem List Items Addressed This Visit     DM (diabetes mellitus), type 2 with complications ([place])    Unable to tolerate Farxiga due to frequent vaginitis.  Discussed option of starting glipazide with patient today. Recommend 2.5mg  dose once a day 30 minutes prior to largest meal Monitor BG once a day and notify of low or elevated BG readings Discussed if meals are skipped to avoid taking glipizide that day as hypoglycemia could result Patient expressed understanding Will send BG results in a week for further evaluation.  F/U in 4 months or sooner if needed      Relevant Medications   glipiZIDE (GLUCOTROL) 5 MG tablet   Other Relevant Orders   CBC with Differential/Platelet   Comprehensive metabolic panel   Lipid panel   Hemoglobin A1c   POCT UA - Microalbumin   Essential hypertension    BP is elevated today Recommend monitoring at home and report readings >130/85 for recommendations No red flags.  F/U with CPE in near future      Relevant Orders   CBC with Differential/Platelet   Comprehensive metabolic panel   Lipid panel   Hemoglobin A1c   POCT UA - Microalbumin   Hyperlipidemia    Labs today Continue current medications.  Labs to be reviewed and changes made to plan of care as appropriate once resulted.       Relevant Orders   Lipid panel   Hemoglobin A1c   Obesity (BMI 30.0-34.9)    Maintaining Exercise and activity information provided F/U in 4 months or sooner if needed      Relevant Orders   CBC with Differential/Platelet   Comprehensive metabolic panel   Lipid panel   Hemoglobin A1c   POCT UA - Microalbumin   Subacute vaginitis    Due to farxiga Recommend stopping medication No symptoms present today       Encounter for medical examination to establish care - Primary    Review of current and past medical history, social history, medication, and family history.  Review of care gaps and health maintenance  recommendations.  Records from recent providers to be requested if not available in Chart Review or Care Everywhere.  Recommendations for health maintenance, diet, and exercise provided.  Labs today: cbc, cmp, lipids, a1c, urine HM Recommendations: obtain records for completed items CPE due: will review records for determination       Leg pain, posterior, left    Symptoms consistent with bakers cyst.  No abnormalities appreciable on examination today Recommend stretching, ice and heat to the affected area Discussed if symptoms worsen or  fail to improve we can do imaging Patient agreeable.  Information provided on handout      Relevant Orders   CBC with Differential/Platelet   Comprehensive metabolic panel   Lipid panel   Hemoglobin A1c   Time: 50 minutes, >50% spent counseling, care coordination, chart review, and documentation.    Return in about 4 months (around 08/25/2021) for DM, HTN.       [name], NP  [facility] [phone] (phone) [phone] (fax)  [place]

## 2021-04-25 NOTE — Assessment & Plan Note (Signed)
Unable to tolerate [place]ade due to frequent vaginitis.  Discussed option of starting glipazide with patient today. Recommend 2.5mg  dose once a day 30 minutes prior to largest meal Monitor BG once a day and notify of low or elevated BG readings Discussed if meals are skipped to avoid taking glipizide that day as hypoglycemia could result Patient expressed understanding Will send BG results in a week for further evaluation.  F/U in 4 months or sooner if needed

## 2021-04-26 LAB — COMPREHENSIVE METABOLIC PANEL
ALT: 50 IU/L — ABNORMAL HIGH (ref 0–32)
AST: 25 IU/L (ref 0–40)
Albumin/Globulin Ratio: 1.9 (ref 1.2–2.2)
Albumin: 4.6 g/dL (ref 3.8–4.8)
Alkaline Phosphatase: 94 IU/L (ref 44–121)
BUN/Creatinine Ratio: 28 (ref 12–28)
BUN: 18 mg/dL (ref 8–27)
Bilirubin Total: 0.3 mg/dL (ref 0.0–1.2)
CO2: 25 mmol/L (ref 20–29)
Calcium: 10.1 mg/dL (ref 8.7–10.3)
Chloride: 101 mmol/L (ref 96–106)
Creatinine, Ser: 0.64 mg/dL (ref 0.57–1.00)
Globulin, Total: 2.4 g/dL (ref 1.5–4.5)
Glucose: 135 mg/dL — ABNORMAL HIGH (ref 65–99)
Potassium: 5.4 mmol/L — ABNORMAL HIGH (ref 3.5–5.2)
Sodium: 141 mmol/L (ref 134–144)
Total Protein: 7 g/dL (ref 6.0–8.5)
eGFR: 97 mL/min/{1.73_m2} (ref >59)

## 2021-04-26 LAB — CBC WITH DIFFERENTIAL/PLATELET
Basophils Absolute: 0.1 10*3/uL (ref 0.0–0.2)
Basos: 1 %
EOS (ABSOLUTE): 0.3 10*3/uL (ref 0.0–0.4)
Eos: 3 %
Hematocrit: 44.7 % (ref 34.0–46.6)
Hemoglobin: 14.8 g/dL (ref 11.1–15.9)
Immature Grans (Abs): 0 10*3/uL (ref 0.0–0.1)
Immature Granulocytes: 0 %
Lymphocytes Absolute: 2.7 10*3/uL (ref 0.7–3.1)
Lymphs: 31 %
MCH: 31.1 pg (ref 26.6–33.0)
MCHC: 33.1 g/dL (ref 31.5–35.7)
MCV: 94 fL (ref 79–97)
Monocytes Absolute: 0.3 10*3/uL (ref 0.1–0.9)
Monocytes: 4 %
Neutrophils Absolute: 5.4 10*3/uL (ref 1.4–7.0)
Neutrophils: 61 %
Platelets: 479 10*3/uL — ABNORMAL HIGH (ref 150–450)
RBC: 4.76 x10E6/uL (ref 3.77–5.28)
RDW: 12.6 % (ref 11.7–15.4)
WBC: 8.8 10*3/uL (ref 3.4–10.8)

## 2021-04-26 LAB — LIPID PANEL
Chol/HDL Ratio: 4.6 ratio — ABNORMAL HIGH (ref 0.0–4.4)
Cholesterol, Total: 133 mg/dL (ref 100–199)
HDL: 29 mg/dL — ABNORMAL LOW (ref >39)
LDL Chol Calc (NIH): 65 mg/dL (ref 0–99)
Triglycerides: 239 mg/dL — ABNORMAL HIGH (ref 0–149)
VLDL Cholesterol Cal: 39 mg/dL (ref 5–40)

## 2021-04-26 LAB — HEMOGLOBIN A1C
Est. average glucose Bld gHb Est-mCnc: 203 mg/dL
Hgb A1c MFr Bld: 8.7 % — ABNORMAL HIGH (ref 4.8–5.6)

## 2021-04-28 NOTE — Progress Notes (Signed)
Jeanne Shaw,   Your labs have come back and there are several things that are out of the normal range. I would like you to focus on a low saturated fat diet and try to walk at least 20 minutes every day. We will plan to recheck these at your next visit and we can make changes if we need to once you have had a chance to be on the new medication for a while.   SaraBeth

## 2021-04-30 ENCOUNTER — Telehealth (HOSPITAL_BASED_OUTPATIENT_CLINIC_OR_DEPARTMENT_OTHER): Payer: Self-pay

## 2021-04-30 NOTE — Telephone Encounter (Signed)
-----   Message from Jeanne Render, NP sent at 04/28/2021  8:14 AM EDT ----- Jeanne Shaw,   Your labs have come back and there are several things that are out of the normal range. I would like you to focus on a low saturated fat diet and try to walk at least 20 minutes every day. We will plan to recheck these at your next visit and we can make changes if we need to once you have had a chance to be on the new medication for a while.   Jeanne Shaw

## 2021-04-30 NOTE — Telephone Encounter (Signed)
Results reviewed by patient via [facility].  Seen on 04/28/2021  9:15 AM

## 2021-05-01 ENCOUNTER — Other Ambulatory Visit (HOSPITAL_BASED_OUTPATIENT_CLINIC_OR_DEPARTMENT_OTHER): Payer: Self-pay | Admitting: Nurse Practitioner

## 2021-05-01 ENCOUNTER — Encounter (HOSPITAL_BASED_OUTPATIENT_CLINIC_OR_DEPARTMENT_OTHER): Payer: Self-pay | Admitting: Nurse Practitioner

## 2021-05-01 DIAGNOSIS — B379 Candidiasis, unspecified: Secondary | ICD-10-CM

## 2021-05-01 MED ORDER — FLUCONAZOLE 150 MG PO TABS: 150.0000 mg | ORAL_TABLET | Freq: Once | ORAL | 0 refills | Status: AC

## 2021-05-20 ENCOUNTER — Encounter (HOSPITAL_BASED_OUTPATIENT_CLINIC_OR_DEPARTMENT_OTHER): Payer: Self-pay | Admitting: Nurse Practitioner

## 2021-05-29 ENCOUNTER — Other Ambulatory Visit (HOSPITAL_BASED_OUTPATIENT_CLINIC_OR_DEPARTMENT_OTHER): Payer: Self-pay | Admitting: Nurse Practitioner

## 2021-05-29 DIAGNOSIS — E118 Type 2 diabetes mellitus with unspecified complications: Secondary | ICD-10-CM

## 2021-05-29 MED ORDER — GLIPIZIDE 5 MG PO TABS: 5.0000 mg | ORAL_TABLET | Freq: Every day | ORAL | 3 refills | Status: DC

## 2021-07-07 ENCOUNTER — Encounter (HOSPITAL_BASED_OUTPATIENT_CLINIC_OR_DEPARTMENT_OTHER): Payer: Self-pay | Admitting: Nurse Practitioner

## 2021-07-08 MED ORDER — OMEPRAZOLE 40 MG PO CPDR: 40.0000 mg | DELAYED_RELEASE_CAPSULE | Freq: Every day | ORAL | 2 refills | Status: DC

## 2021-07-17 ENCOUNTER — Encounter (HOSPITAL_BASED_OUTPATIENT_CLINIC_OR_DEPARTMENT_OTHER): Payer: Self-pay | Admitting: Nurse Practitioner

## 2021-07-18 MED ORDER — METFORMIN HCL 1000 MG PO TABS: 1000.0000 mg | ORAL_TABLET | Freq: Two times a day (BID) | ORAL | 0 refills | Status: DC

## 2021-07-22 ENCOUNTER — Ambulatory Visit: Payer: Federal, State, Local not specified - PPO | Admitting: Family Medicine

## 2021-08-14 ENCOUNTER — Encounter (HOSPITAL_BASED_OUTPATIENT_CLINIC_OR_DEPARTMENT_OTHER): Payer: Self-pay | Admitting: Nurse Practitioner

## 2021-08-14 DIAGNOSIS — I10 Essential (primary) hypertension: Secondary | ICD-10-CM

## 2021-08-15 MED ORDER — SITAGLIPTIN PHOSPHATE 100 MG PO TABS: 100.0000 mg | ORAL_TABLET | Freq: Every day | ORAL | 0 refills | Status: DC

## 2021-08-15 MED ORDER — LISINOPRIL 10 MG PO TABS: 10.0000 mg | ORAL_TABLET | Freq: Every day | ORAL | 0 refills | Status: DC

## 2021-08-25 ENCOUNTER — Encounter (HOSPITAL_BASED_OUTPATIENT_CLINIC_OR_DEPARTMENT_OTHER): Payer: Self-pay | Admitting: Nurse Practitioner

## 2021-08-25 ENCOUNTER — Other Ambulatory Visit: Payer: Self-pay

## 2021-08-25 ENCOUNTER — Ambulatory Visit (INDEPENDENT_AMBULATORY_CARE_PROVIDER_SITE_OTHER): Payer: Medicare Other | Admitting: Nurse Practitioner

## 2021-08-25 DIAGNOSIS — E669 Obesity, unspecified: Secondary | ICD-10-CM | POA: Diagnosis not present

## 2021-08-25 DIAGNOSIS — E118 Type 2 diabetes mellitus with unspecified complications: Secondary | ICD-10-CM

## 2021-08-25 DIAGNOSIS — E782 Mixed hyperlipidemia: Secondary | ICD-10-CM

## 2021-08-25 DIAGNOSIS — H269 Unspecified cataract: Secondary | ICD-10-CM | POA: Insufficient documentation

## 2021-08-25 DIAGNOSIS — E559 Vitamin D deficiency, unspecified: Secondary | ICD-10-CM

## 2021-08-25 DIAGNOSIS — I10 Essential (primary) hypertension: Secondary | ICD-10-CM | POA: Diagnosis not present

## 2021-08-25 MED ORDER — VITAMIN D 125 MCG (5000 UT) PO CAPS: 5000.0000 [IU] | ORAL_CAPSULE | Freq: Every day | ORAL | 3 refills | Status: DC

## 2021-08-25 MED ORDER — GLIPIZIDE 5 MG PO TABS: 5.0000 mg | ORAL_TABLET | Freq: Every day | ORAL | 3 refills | Status: DC

## 2021-08-25 MED ORDER — SITAGLIPTIN PHOSPHATE 100 MG PO TABS: 100.0000 mg | ORAL_TABLET | Freq: Every day | ORAL | 3 refills | Status: DC

## 2021-08-25 MED ORDER — METFORMIN HCL 1000 MG PO TABS: 1000.0000 mg | ORAL_TABLET | Freq: Two times a day (BID) | ORAL | 3 refills | Status: DC

## 2021-08-25 MED ORDER — SIMVASTATIN 10 MG PO TABS: 10.0000 mg | ORAL_TABLET | Freq: Every day | ORAL | 3 refills | Status: DC

## 2021-08-25 MED ORDER — LISINOPRIL 10 MG PO TABS: 10.0000 mg | ORAL_TABLET | Freq: Every day | ORAL | 3 refills | Status: DC

## 2021-08-25 MED ORDER — OMEPRAZOLE 40 MG PO CPDR: 40.0000 mg | DELAYED_RELEASE_CAPSULE | Freq: Every day | ORAL | 3 refills | Status: DC

## 2021-08-25 NOTE — Patient Instructions (Signed)
You may come by the office this week to have fasting labs drawn. The lab is open Monday thrugh Thursday 8 am-12pm and 1pm - 5 pm and on Friday from 8am-12pm.   Monitor your diet and be sure that you are eating no more than 180g of carbohydrates a day spread out throughout the day. Also limit your saturated fat intake to no more than 13g a day.   I recommend walking 20 minutes every day to help control your blood sugar and improve your heart health.   I will be in touch with you based on your lab results and we can make any changes as needed.  I do recommend taking your diabetes medication in the morning approximately 30 minutes before eating for best results.  If you have and that you have missed your dose go ahead and take the dose as soon as you remember if it is been within an hour or so of eating.  Otherwise you can take it right before your next meal. Be sure that you are monitoring your blood sugar levels and check your blood sugar if you have any symptoms of low blood sugar such as shaking, dizziness, nausea, weakness, or confusion.  Notify any low blood sugar readings immediately to the office.  We will plan to follow-up in 3 months in the office or sooner if needed.

## 2021-08-25 NOTE — Assessment & Plan Note (Signed)
Reportedly well controlled with no symptoms.  No BP today as visit was converted to virtual due to provider illness.  Will plan to monitor BP at next visit and will get labs today for evaluation.  Continue medications as prescribed and report any blood pressure higher than 140/85 to the provider for evaluation if it remains elevated with repeat testing at home.

## 2021-08-25 NOTE — Assessment & Plan Note (Signed)
Recommend exercise 20 minutes daily with walking.  Monitor diet and be sure to eat no more than 180 grams of carbohydrates and no more than 13g of saturated fat daily.  Replace sugary and diet drinks with water for best management.

## 2021-08-25 NOTE — Assessment & Plan Note (Signed)
Recent eye exam reportedly showed beginning cataract formation.  Will send referral for ophthalmologist for complete evaluation and recommendations.

## 2021-08-25 NOTE — Assessment & Plan Note (Signed)
Currently on metformin, Januvia, and glipizide daily.  Likely morning highs are related to missed medication doses and suspect drops in the night that could be causing the changes.  At this time, recommend taking glipizide in the morning at the same time daily and then eating breakfast. Monitor for low blood sugars.  Recommend eating a snack before bed with carbohydrate and protein to help stabilize night time blood sugar levels and keep them from dropping and rebounding.  Monitor blood sugar closely and make sure to check blood sugar if symptoms of hypoglycemia present.  We will get labs today- patient will come in to have these done this week. Changes to be made based on labs.  F/U in 3 months for BP and DM check.

## 2021-08-25 NOTE — Progress Notes (Signed)
Virtual Visit Encounter telephone visit.   I connected with  Jeanne Shaw on 08/25/21 at  9:10 AM EST by secure audio and/or video enabled telemedicine application. I verified that I am speaking with the correct person using two identifiers.   I introduced myself as a Designer, jewellery with the practice. The limitations of evaluation and management by telemedicine discussed with the patient and the availability of in person appointments. The patient expressed verbal understanding and consent to proceed.  Participating parties in this visit include: Myself and patient  The patient is: Patient Location: Home I am: Provider Location: Office/Clinic Subjective:    CC and HPI: Jeanne Shaw is a 66 y.o. year old female presenting for follow up of blood pressure and diabetes.  She reports her blood pressures have been stable with no concerns. No CP, palp, HA, Vision changes, dizziness.   She reports her blood sugar levels have not been very well controlled. She is currently taking her medication as prescribed. She does endorse missing some doses of her glipizide as she forgets to take this prior to the largest meal on occasion. She tells me her largest meal varies at time of day from day to day. She reports her average AM fasting blood sugars have been elevated. Usually 150's-180's. She does endorse taking her BG in the night or before bed at times and has noticed that her BG will be in the low 110's or even down in the 80's overnight. She reports that she watches what she is eating and has cut out excess carbohydrates from her diet. She is not sure why her AM levels are as high as they are. She has no concerning symptoms and no hypoglycemic events.   Past medical history, Surgical history, Family history not pertinant except as noted below, Social history, Allergies, and medications have been entered into the medical record, reviewed, and corrections made.   Review of  Systems:  All review of systems negative except what is listed in the HPI  Objective:    Alert and oriented x 4 Speaking in clear sentences with no shortness of breath. No distress.  Impression and Recommendations:    Problem List Items Addressed This Visit     DM (diabetes mellitus), type 2 with complications ([place])    Currently on metformin, Januvia, and glipizide daily.  Likely morning highs are related to missed medication doses and suspect drops in the night that could be causing the changes.  At this time, recommend taking glipizide in the morning at the same time daily and then eating breakfast. Monitor for low blood sugars.  Recommend eating a snack before bed with carbohydrate and protein to help stabilize night time blood sugar levels and keep them from dropping and rebounding.  Monitor blood sugar closely and make sure to check blood sugar if symptoms of hypoglycemia present.  We will get labs today- patient will come in to have these done this week. Changes to be made based on labs.  F/U in 3 months for BP and DM check.       Relevant Medications   glipiZIDE (GLUCOTROL) 5 MG tablet   lisinopril (ZESTRIL) 10 MG tablet   metFORMIN (GLUCOPHAGE) 1000 MG tablet   simvastatin (ZOCOR) 10 MG tablet   sitaGLIPtin (JANUVIA) 100 MG tablet   omeprazole (PRILOSEC) 40 MG capsule   Other Relevant Orders   CBC with Differential/Platelet   Comprehensive metabolic panel   Lipid panel   Hemoglobin A1c   Ambulatory referral  to Ophthalmology   Essential hypertension    Reportedly well controlled with no symptoms.  No BP today as visit was converted to virtual due to provider illness.  Will plan to monitor BP at next visit and will get labs today for evaluation.  Continue medications as prescribed and report any blood pressure higher than 140/85 to the provider for evaluation if it remains elevated with repeat testing at home.       Relevant Medications   glipiZIDE (GLUCOTROL) 5 MG  tablet   lisinopril (ZESTRIL) 10 MG tablet   metFORMIN (GLUCOPHAGE) 1000 MG tablet   simvastatin (ZOCOR) 10 MG tablet   sitaGLIPtin (JANUVIA) 100 MG tablet   omeprazole (PRILOSEC) 40 MG capsule   Other Relevant Orders   CBC with Differential/Platelet   Comprehensive metabolic panel   Lipid panel   Hemoglobin A1c   Ambulatory referral to Ophthalmology   Hyperlipidemia - Primary    Patient will come in for fasting labs this week.  No changes to medications at this time.  F/U in 3 months or sooner based on labs      Relevant Medications   glipiZIDE (GLUCOTROL) 5 MG tablet   lisinopril (ZESTRIL) 10 MG tablet   metFORMIN (GLUCOPHAGE) 1000 MG tablet   simvastatin (ZOCOR) 10 MG tablet   sitaGLIPtin (JANUVIA) 100 MG tablet   omeprazole (PRILOSEC) 40 MG capsule   Other Relevant Orders   CBC with Differential/Platelet   Comprehensive metabolic panel   Lipid panel   Hemoglobin A1c   Ambulatory referral to Ophthalmology   Obesity (BMI 30.0-34.9)    Recommend exercise 20 minutes daily with walking.  Monitor diet and be sure to eat no more than 180 grams of carbohydrates and no more than 13g of saturated fat daily.  Replace sugary and diet drinks with water for best management.       Relevant Medications   glipiZIDE (GLUCOTROL) 5 MG tablet   lisinopril (ZESTRIL) 10 MG tablet   metFORMIN (GLUCOPHAGE) 1000 MG tablet   simvastatin (ZOCOR) 10 MG tablet   sitaGLIPtin (JANUVIA) 100 MG tablet   omeprazole (PRILOSEC) 40 MG capsule   Other Relevant Orders   CBC with Differential/Platelet   Comprehensive metabolic panel   Lipid panel   Hemoglobin A1c   Cataract of both eyes    Recent eye exam reportedly showed beginning cataract formation.  Will send referral for ophthalmologist for complete evaluation and recommendations.       Relevant Orders   Ambulatory referral to Ophthalmology   Other Visit Diagnoses     Vitamin D deficiency       Relevant Medications   Cholecalciferol  (VITAMIN D) 125 MCG (5000 UT) CAPS       orders and follow up as documented in EMR I discussed the assessment and treatment plan with the patient. The patient was provided an opportunity to ask questions and all were answered. The patient agreed with the plan and demonstrated an understanding of the instructions.   The patient was advised to call back or seek an in-person evaluation if the symptoms worsen or if the condition fails to improve as anticipated.  Follow-Up: in 3 months  I provided 26 minutes of non-face-to-face interaction with this non face-to-face encounter including intake, same-day documentation, and chart review.   [name], NP , DNP, AGNP-c [facility] [phone] (fax)

## 2021-08-25 NOTE — Assessment & Plan Note (Signed)
Patient will come in for fasting labs this week.  No changes to medications at this time.  F/U in 3 months or sooner based on labs

## 2021-09-03 ENCOUNTER — Other Ambulatory Visit (HOSPITAL_BASED_OUTPATIENT_CLINIC_OR_DEPARTMENT_OTHER): Payer: Self-pay | Admitting: Nurse Practitioner

## 2021-09-04 LAB — LIPID PANEL W/O CHOL/HDL RATIO
Cholesterol, Total: 128 mg/dL (ref 100–199)
HDL: 29 mg/dL — ABNORMAL LOW (ref >39)
LDL Chol Calc (NIH): 56 mg/dL (ref 0–99)
Triglycerides: 269 mg/dL — ABNORMAL HIGH (ref 0–149)
VLDL Cholesterol Cal: 43 mg/dL — ABNORMAL HIGH (ref 5–40)

## 2021-09-04 LAB — CBC WITH DIFFERENTIAL/PLATELET
Basophils Absolute: 0.1 10*3/uL (ref 0.0–0.2)
Basos: 1 %
EOS (ABSOLUTE): 0.4 10*3/uL (ref 0.0–0.4)
Eos: 5 %
Hematocrit: 42.6 % (ref 34.0–46.6)
Hemoglobin: 14.6 g/dL (ref 11.1–15.9)
Immature Grans (Abs): 0 10*3/uL (ref 0.0–0.1)
Immature Granulocytes: 0 %
Lymphocytes Absolute: 2.5 10*3/uL (ref 0.7–3.1)
Lymphs: 32 %
MCH: 31.6 pg (ref 26.6–33.0)
MCHC: 34.3 g/dL (ref 31.5–35.7)
MCV: 92 fL (ref 79–97)
Monocytes Absolute: 0.4 10*3/uL (ref 0.1–0.9)
Monocytes: 5 %
Neutrophils Absolute: 4.4 10*3/uL (ref 1.4–7.0)
Neutrophils: 57 %
Platelets: 495 10*3/uL — ABNORMAL HIGH (ref 150–450)
RBC: 4.62 x10E6/uL (ref 3.77–5.28)
RDW: 12.1 % (ref 11.7–15.4)
WBC: 7.8 10*3/uL (ref 3.4–10.8)

## 2021-09-04 LAB — COMPREHENSIVE METABOLIC PANEL
ALT: 72 IU/L — ABNORMAL HIGH (ref 0–32)
AST: 32 IU/L (ref 0–40)
Albumin/Globulin Ratio: 2 (ref 1.2–2.2)
Albumin: 4.4 g/dL (ref 3.8–4.8)
Alkaline Phosphatase: 97 IU/L (ref 44–121)
BUN/Creatinine Ratio: 27 (ref 12–28)
BUN: 17 mg/dL (ref 8–27)
Bilirubin Total: 0.3 mg/dL (ref 0.0–1.2)
CO2: 24 mmol/L (ref 20–29)
Calcium: 9.7 mg/dL (ref 8.7–10.3)
Chloride: 101 mmol/L (ref 96–106)
Creatinine, Ser: 0.63 mg/dL (ref 0.57–1.00)
Globulin, Total: 2.2 g/dL (ref 1.5–4.5)
Glucose: 221 mg/dL — ABNORMAL HIGH (ref 70–99)
Potassium: 5.1 mmol/L (ref 3.5–5.2)
Sodium: 139 mmol/L (ref 134–144)
Total Protein: 6.6 g/dL (ref 6.0–8.5)
eGFR: 98 mL/min/{1.73_m2} (ref >59)

## 2021-09-04 LAB — HEMOGLOBIN A1C
Est. average glucose Bld gHb Est-mCnc: 197 mg/dL
Hgb A1c MFr Bld: 8.5 % — ABNORMAL HIGH (ref 4.8–5.6)

## 2021-09-12 ENCOUNTER — Encounter (HOSPITAL_BASED_OUTPATIENT_CLINIC_OR_DEPARTMENT_OTHER): Payer: Self-pay | Admitting: Nurse Practitioner

## 2021-09-18 NOTE — Progress Notes (Signed)
[facility] Note  09/22/2021     CHIEF COMPLAINT Patient presents for Retina Follow Up  HISTORY OF PRESENT ILLNESS: Jeanne Shaw is a 66 y.o. female who presents to the clinic today for:   HPI     Retina Follow Up   Patient presents with  Dry AMD.  In both eyes.  Severity is moderate.  Duration of 6 months.  Since onset it is gradually worsening.  I, the attending physician,  performed the HPI with the patient and updated documentation appropriately.        Comments   Pt here for 6 mo ret f/u for non exu ARMD OU. Pt states she feels her vision has gotten blurrier. Doesn't report any wavy lines in vision. Pt does not report an AEE yet this year, she does have an appointment for cataract eval in May w/ Dr. Katy Fitch. Most recent A1C taken was 8.5 in Feb 2023.       Last edited by Bernarda Caffey, MD on 09/22/2021  8:11 AM.     pt states she feels like her vision gets blurry as the day goes on  Referring physician: No referring provider defined for this encounter.  HISTORICAL INFORMATION:   Selected notes from the MEDICAL RECORD NUMBER Referred by M. Dixon, PA-C for DM exam Ocular Hx-  PMH- DM (A1C 7.4 on 04.01.19), HTN, asthma,     CURRENT MEDICATIONS: No current outpatient medications on file. (Ophthalmic Drugs)   No current facility-administered medications for this visit. (Ophthalmic Drugs)   Current Outpatient Medications (Other)  Medication Sig   Ascorbic Acid (VITAMIN C) 1000 MG tablet Take 1,000 mg by mouth daily.   aspirin EC 81 MG tablet Take 81 mg by mouth daily.   Cholecalciferol (VITAMIN D) 125 MCG (5000 UT) CAPS Take 5,000 Units by mouth daily.   Flaxseed, Linseed, (FLAX SEEDS PO) Take 1 tablet by mouth daily.    glipiZIDE (GLUCOTROL) 5 MG tablet Take 1 tablet (5 mg total) by mouth daily before breakfast. Try to take about 30 minutes before eating.   lisinopril (ZESTRIL) 10 MG tablet Take 1 tablet (10 mg total) by mouth  daily.   meclizine (ANTIVERT) 12.5 MG tablet Take 1 tablet (12.5 mg total) by mouth 3 (three) times daily as needed for dizziness.   metFORMIN (GLUCOPHAGE) 1000 MG tablet Take 1 tablet (1,000 mg total) by mouth 2 (two) times daily with a meal. TAKE 1 TABLET BY MOUTH TWICE DAILY WITH A MEAL   MILK THISTLE PO Take 1 tablet by mouth at bedtime.    Multiple Vitamins-Minerals (PRESERVISION AREDS) CAPS Take 1 capsule by mouth at bedtime.    Omega-3 Fatty Acids (FISH OIL) 1000 MG CAPS Take 1 capsule (1,000 mg total) by mouth daily. (Patient taking differently: Take 1,000 mg by mouth daily.)   omeprazole (PRILOSEC) 40 MG capsule Take 1 capsule (40 mg total) by mouth daily.   Red Yeast Rice Extract (RED YEAST RICE PO) Take 1 tablet by mouth daily.    simvastatin (ZOCOR) 10 MG tablet Take 1 tablet (10 mg total) by mouth at bedtime.   sitaGLIPtin (JANUVIA) 100 MG tablet Take 1 tablet (100 mg total) by mouth daily.   sodium chloride (OCEAN) 0.65 % SOLN nasal spray Place 1 spray into both nostrils daily as needed for congestion.   Zinc 50 MG CAPS    No current facility-administered medications for this visit. (Other)   REVIEW OF SYSTEMS: ROS  Positive for: Skin, Endocrine, Eyes Negative for: Constitutional, Gastrointestinal, Neurological, Genitourinary, Musculoskeletal, HENT, Cardiovascular, Respiratory, Psychiatric, Allergic/Imm, Heme/Lymph Last edited by Kingsley Spittle, COT on 09/22/2021  7:46 AM.     ALLERGIES Allergies  Allergen Reactions   Hydrocodone Anxiety   PAST MEDICAL HISTORY Past Medical History:  Diagnosis Date   Allergy    Anxiety    Asthma    Cataract    OU   Diabetes mellitus without complication ([place])    GERD (gastroesophageal reflux disease)    Hypertension    Macular degeneration    Dry OU   Past Surgical History:  Procedure Laterality Date   BIOPSY  03/26/2020   Procedure: BIOPSY;  Surgeon: Harvel Quale, MD;  [facility];  Service:  Gastroenterology;;   COLONOSCOPY WITH PROPOFOL N/A 03/26/2020   Procedure: COLONOSCOPY WITH PROPOFOL;  Surgeon: Harvel Quale, MD;  [facility];  Service: Gastroenterology;  Laterality: N/A;  815   CYST EXCISION     off vocal cord    POLYPECTOMY  03/26/2020   Procedure: POLYPECTOMY;  Surgeon: Montez Morita, Quillian Quince, MD;  [facility];  Service: Gastroenterology;;    FAMILY HISTORY Family History  Problem Relation Age of Onset   COPD Mother    Alcohol abuse Father    Diabetes Father    Heart disease Father    Hyperlipidemia Father    Hypertension Father    Cancer Sister    Diabetes Sister    Hyperlipidemia Sister    Hypertension Sister    Breast cancer Sister 48   Hypertension Brother    Alcohol abuse Maternal Aunt    Alcohol abuse Maternal Uncle    Hypertension Maternal Uncle    Alcohol abuse Paternal Aunt    Cancer Paternal Aunt    Alcohol abuse Paternal Uncle    Cancer Paternal Uncle    Diabetes Maternal Grandmother    Stroke Maternal Grandmother    Heart disease Maternal Grandfather    SOCIAL HISTORY Social History   Tobacco Use   Smoking status: Former    Types: Cigarettes    Quit date: 08/03/2015    Years since quitting: 6.1   Smokeless tobacco: Former  Scientific laboratory technician Use: Never used  Substance Use Topics   Alcohol use: Yes    Alcohol/week: 12.0 standard drinks    Types: 12 Glasses of wine per week    Comment: beer occassionally   Drug use: No       OPHTHALMIC EXAM:  Base Eye Exam     Visual Acuity (Snellen - Linear)       Right Left   Dist cc 20/20 -2 20/25 +1   Dist ph cc  20/20 -2    Correction: Glasses         Tonometry (Tonopen, 7:52 AM)       Right Left   Pressure 15 15         Pupils       Dark Light Shape React APD   Right 3 2 Round Brisk None   Left 3 2 Round Brisk None         Visual Fields (Counting fingers)       Left Right    Full Full         Extraocular Movement        Right Left    Full, Ortho Full, Ortho         Neuro/Psych  Oriented x3: Yes   Mood/Affect: Normal         Dilation     Both eyes: 1.0% Mydriacyl, 2.5% Phenylephrine @ 7:53 AM           Slit Lamp and Fundus Exam     Slit Lamp Exam       Right Left   Lids/Lashes Dermatochalasis - upper lid Dermatochalasis - upper lid   Conjunctiva/Sclera nasal and temporal pinguecula nasal and temporal pinguecula   Cornea Trace Punctate epithelial erosions, trace tear film debris Trace Punctate epithelial erosions, trace tear film debris   Anterior Chamber Deep and quiet Deep and quiet   Iris Round and dilated, No NVI Round and dilated, No NVI   Lens 2-3+ Nuclear sclerosis, 2-3+ Cortical cataract 2-3+ Nuclear sclerosis, 2-3+ Cortical cataract   Anterior Vitreous Mild Vitreous syneresis Mild Vitreous syneresis         Fundus Exam       Right Left   Disc Pink and Sharp Pink and Sharp, Compact   C/D Ratio 0.3 0.2   Macula flat, blunted foveal reflex, mild RPE mottling and clumping, focal druse/pigment clump superonasal to fovea, No heme or edema Flat, Blunted foveal reflex, drusen, mild RPE mottling, clumping and early atrophy, +PEDs, No heme or edema   Vessels attenuated, Tortuous, AV crossing changes, mild Copper wiring attenuated, Tortuous, AV crossing changes, mild Copper wiring   Periphery Attached, focal peripheral drusen nasally, reticular degeneration, No heme Attached, patch of pavingstone at 0600, mild reticular degeneration, No heme           Refraction     Wearing Rx       Sphere Cylinder Axis Add   Right +1.00 +1.50 175 +2.25   Left +1.25 +1.25 018 +2.25    Type: PAL            IMAGING AND PROCEDURES  Imaging and Procedures for 11/16/17  OCT, Retina - OU - Both Eyes       Right Eye Quality was good. Central Foveal Thickness: 280. Progression has been stable. Findings include normal foveal contour, no IRF, no SRF, pigment epithelial  detachment, outer retinal atrophy, subretinal hyper-reflective material, retinal drusen (Focal PED/SRHM with overlying ORA -- stable from prior).   Left Eye Quality was good. Central Foveal Thickness: 288. Progression has been stable. Findings include normal foveal contour, no IRF, no SRF, pigment epithelial detachment, intraretinal hyper-reflective material, retinal drusen , outer retinal atrophy (?mild progression of ORA).   Notes *Images captured and stored on drive  Diagnosis / Impression: NFP, No IRF/SRF OU Drusen and PEDs OS>OD No DME OU  OD: Focal PED/SRHM with overlying ORA -- stable from prior OS: ?mild progression of ORA  Clinical management:  See below  Abbreviations: NFP - Normal foveal profile. CME - cystoid macular edema. PED - pigment epithelial detachment. IRF - intraretinal fluid. SRF - subretinal fluid. EZ - ellipsoid zone. ERM - epiretinal membrane. ORA - outer retinal atrophy. ORT - outer retinal tubulation. SRHM - subretinal hyper-reflective material             ASSESSMENT/PLAN:    ICD-10-CM   1. Intermediate stage nonexudative age-related macular degeneration of both eyes  H35.3132 OCT, Retina - OU - Both Eyes    2. Diabetes mellitus type 2 without retinopathy ([place])  E11.9     3. Essential hypertension  I10     4. Hypertensive retinopathy of both eyes  H35.033     5. Combined forms of  age-related cataract of both eyes  H25.813      1. Non-Exudative ARMD OU-   - Intermediate stage (OS > OD)  - scattered, prominent central drusen and PEDs OU (OS > OD)  - OCT shows ?progression of ORA OS  - BCVA remains 20/20 OU  - continue amsler grid monitoring  - f/u 6 months for repeat DFE/OCT  2. Diabetes mellitus, type 2 without retinopathy  - The incidence, risk factors for progression, natural history and treatment options for diabetic retinopathy  were discussed with patient.    - The need for close monitoring of blood glucose, blood pressure, and serum  lipids, avoiding cigarette or any type of tobacco, and the need for long term follow up was also discussed with patient.  - monitor   3,4. Hypertensive retinopathy OU - discussed importance of tight BP control - monitor  5. Combined form age-related cataract OU-  - The symptoms of cataract, surgical options, and treatments and risks were discussed with patient.  - discussed diagnosis and progression  - scheduled to f/u w/ Dr. Katy Fitch in May 2023  Ophthalmic Meds Ordered this visit:  No orders of the defined types were placed in this encounter.    Return in about 6 months (around 03/22/2022) for f/u exu ARMD OU, DFE, OCT.  There are no Patient Instructions on file for this visit.   Explained the diagnoses, plan, and follow up with the patient and they expressed understanding.  Patient expressed understanding of the importance of proper follow up care.    This document serves as a record of services personally performed by Gardiner Sleeper, MD, PhD. It was created on their behalf by Roselee Nova, COMT. The creation of this record is the provider's dictation and/or activities during the visit.  Electronically signed by: Roselee Nova, COMT 09/22/21 9:22 AM  This document serves as a record of services personally performed by Gardiner Sleeper, MD, PhD. It was created on their behalf by San Jetty. Owens Shark, OA an ophthalmic technician. The creation of this record is the provider's dictation and/or activities during the visit.    Electronically signed by: San Jetty. Joseph, [place] 02.20.2023 9:22 AM   Gardiner Sleeper, M.D., Ph.D. Diseases & Surgery of the Retina and Vitreous [facility]  I have reviewed the above documentation for accuracy and completeness, and I agree with the above. Gardiner Sleeper, M.D., Ph.D. 09/22/21 9:24 AM   Abbreviations: M myopia (nearsighted); A astigmatism; H hyperopia (farsighted); P presbyopia; Mrx spectacle prescription;  CTL contact lenses; OD  right eye; OS left eye; OU both eyes  XT exotropia; ET esotropia; PEK punctate epithelial keratitis; PEE punctate epithelial erosions; DES dry eye syndrome; MGD meibomian gland dysfunction; ATs artificial tears; PFAT's preservative free artificial tears; [place] nuclear sclerotic cataract; PSC posterior subcapsular cataract; ERM epi-retinal membrane; PVD posterior vitreous detachment; RD retinal detachment; DM diabetes mellitus; DR diabetic retinopathy; NPDR non-proliferative diabetic retinopathy; PDR proliferative diabetic retinopathy; CSME clinically significant macular edema; DME diabetic macular edema; dbh dot blot hemorrhages; CWS cotton wool spot; POAG primary open angle glaucoma; C/D cup-to-disc ratio; HVF humphrey visual field; GVF goldmann visual field; OCT optical coherence tomography; IOP intraocular pressure; BRVO Branch retinal vein occlusion; CRVO central retinal vein occlusion; CRAO central retinal artery occlusion; BRAO branch retinal artery occlusion; RT retinal tear; SB scleral buckle; PPV pars plana vitrectomy; VH Vitreous hemorrhage; PRP panretinal laser photocoagulation; IVK intravitreal kenalog; VMT vitreomacular traction; MH Macular hole;  NVD neovascularization  of the disc; NVE neovascularization elsewhere; AREDS age related eye disease study; ARMD age related macular degeneration; POAG primary open angle glaucoma; EBMD epithelial/anterior basement membrane dystrophy; ACIOL anterior chamber intraocular lens; IOL intraocular lens; PCIOL posterior chamber intraocular lens; Phaco/IOL phacoemulsification with intraocular lens placement; [place] photorefractive keratectomy; LASIK laser assisted in situ keratomileusis; HTN hypertension; DM diabetes mellitus; COPD chronic obstructive pulmonary disease

## 2021-09-22 ENCOUNTER — Ambulatory Visit (INDEPENDENT_AMBULATORY_CARE_PROVIDER_SITE_OTHER): Payer: Medicare Other | Admitting: Ophthalmology

## 2021-09-22 ENCOUNTER — Encounter (INDEPENDENT_AMBULATORY_CARE_PROVIDER_SITE_OTHER): Payer: Self-pay | Admitting: Ophthalmology

## 2021-09-22 ENCOUNTER — Other Ambulatory Visit: Payer: Self-pay

## 2021-09-22 DIAGNOSIS — E119 Type 2 diabetes mellitus without complications: Secondary | ICD-10-CM

## 2021-09-22 DIAGNOSIS — I10 Essential (primary) hypertension: Secondary | ICD-10-CM | POA: Diagnosis not present

## 2021-09-22 DIAGNOSIS — H353132 Nonexudative age-related macular degeneration, bilateral, intermediate dry stage: Secondary | ICD-10-CM

## 2021-09-22 DIAGNOSIS — H35033 Hypertensive retinopathy, bilateral: Secondary | ICD-10-CM | POA: Diagnosis not present

## 2021-09-22 DIAGNOSIS — H25813 Combined forms of age-related cataract, bilateral: Secondary | ICD-10-CM

## 2021-11-14 ENCOUNTER — Ambulatory Visit (INDEPENDENT_AMBULATORY_CARE_PROVIDER_SITE_OTHER): Payer: Medicare Other

## 2021-11-14 ENCOUNTER — Encounter (HOSPITAL_BASED_OUTPATIENT_CLINIC_OR_DEPARTMENT_OTHER): Payer: Self-pay

## 2021-11-14 VITALS — Ht 61.0 in

## 2021-11-14 DIAGNOSIS — Z0000 Encounter for general adult medical examination without abnormal findings: Secondary | ICD-10-CM

## 2021-11-14 NOTE — Progress Notes (Signed)
? ?Subjective:  ? Jeanne Shaw is a 66 y.o. female who presents for an Initial Medicare Annual Wellness Visit. ? ?Review of Systems    ? ?Cardiac Risk Factors include: advanced age (>51mn, >>61women) ? ?   ?Objective:  ?  ?Today's Vitals  ? 11/14/21 1323  ?Height: '5\' 1"'$  (1.549 m)  ? ?Body mass index is 32.61 kg/m?. ? ? ?  11/14/2021  ?  1:14 PM 03/26/2020  ?  7:21 AM  ?Advanced Directives  ?Does Patient Have a Medical Advance Directive? No No  ?Would patient like information on creating a medical advance directive? No - Patient declined No - Patient declined  ? ? ?Current Medications (verified) ?Outpatient Encounter Medications as of 11/14/2021  ?Medication Sig  ? Ascorbic Acid (VITAMIN C) 1000 MG tablet Take 1,000 mg by mouth daily.  ? aspirin EC 81 MG tablet Take 81 mg by mouth daily.  ? Cholecalciferol (VITAMIN D) 125 MCG (5000 UT) CAPS Take 5,000 Units by mouth daily.  ? Flaxseed, Linseed, (FLAX SEEDS PO) Take 1 tablet by mouth daily.   ? glipiZIDE (GLUCOTROL) 5 MG tablet Take 1 tablet (5 mg total) by mouth daily before breakfast. Try to take about 30 minutes before eating.  ? lisinopril (ZESTRIL) 10 MG tablet Take 1 tablet (10 mg total) by mouth daily.  ? metFORMIN (GLUCOPHAGE) 1000 MG tablet Take 1 tablet (1,000 mg total) by mouth 2 (two) times daily with a meal. TAKE 1 TABLET BY MOUTH TWICE DAILY WITH A MEAL  ? MILK THISTLE PO Take 1 tablet by mouth at bedtime.   ? Multiple Vitamins-Minerals (PRESERVISION AREDS) CAPS Take 1 capsule by mouth at bedtime.   ? Omega-3 Fatty Acids (FISH OIL) 1000 MG CAPS Take 1 capsule (1,000 mg total) by mouth daily. (Patient taking differently: Take 1,000 mg by mouth daily.)  ? omeprazole (PRILOSEC) 40 MG capsule Take 1 capsule (40 mg total) by mouth daily.  ? Red Yeast Rice Extract (RED YEAST RICE PO) Take 1 tablet by mouth daily.   ? simvastatin (ZOCOR) 10 MG tablet Take 1 tablet (10 mg total) by mouth at bedtime.  ? sitaGLIPtin (JANUVIA) 100 MG tablet Take 1  tablet (100 mg total) by mouth daily.  ? sodium chloride (OCEAN) 0.65 % SOLN nasal spray Place 1 spray into both nostrils daily as needed for congestion.  ? Zinc 50 MG CAPS   ? meclizine (ANTIVERT) 12.5 MG tablet Take 1 tablet (12.5 mg total) by mouth 3 (three) times daily as needed for dizziness. (Patient not taking: Reported on 11/14/2021)  ? ?No facility-administered encounter medications on file as of 11/14/2021.  ? ? ?Allergies (verified) ?Hydrocodone  ? ?History: ?Past Medical History:  ?Diagnosis Date  ? Allergy   ? Anxiety   ? Asthma   ? Cataract   ? OU  ? [place]   ? GERD (gastroesophageal reflux disease)   ? Hypertension   ? Macular degeneration   ? Dry OU  ? ?Past Surgical History:  ?Procedure Laterality Date  ? BIOPSY  03/26/2020  ? Procedure: BIOPSY;  Surgeon: CMontez Morita DQuillian Quince MD;  [facility];  Service: Gastroenterology;;  ? COLONOSCOPY WITH PROPOFOL N/A 03/26/2020  ? Procedure: COLONOSCOPY WITH PROPOFOL;  Surgeon: CHarvel Quale MD;  [facility];  Service: Gastroenterology;  Laterality: N/A;  815  ? CYST EXCISION    ? off vocal cord   ? POLYPECTOMY  03/26/2020  ? Procedure: POLYPECTOMY;  Surgeon: Montez Morita, Quillian Quince, MD;  [facility];  Service: Gastroenterology;;  ? ?Family History  ?Problem Relation Age of Onset  ? COPD Mother   ? Alcohol abuse Father   ? Diabetes Father   ? Heart disease Father   ? Hyperlipidemia Father   ? Hypertension Father   ? Cancer Sister   ? Diabetes Sister   ? Hyperlipidemia Sister   ? Hypertension Sister   ? Breast cancer Sister 51  ? Hypertension Brother   ? Alcohol abuse Maternal Aunt   ? Alcohol abuse Maternal Uncle   ? Hypertension Maternal Uncle   ? Alcohol abuse Paternal Aunt   ? Cancer Paternal Aunt   ? Alcohol abuse Paternal Uncle   ? Cancer Paternal Uncle   ? Diabetes Maternal Grandmother   ? Stroke Maternal Grandmother   ? Heart disease Maternal Grandfather   ? ?Social  History  ? ?Socioeconomic History  ? Marital status: Married  ?  Spouse name: Not on file  ? Number of children: Not on file  ? Years of education: Not on file  ? Highest education level: Not on file  ?Occupational History  ? Not on file  ?Tobacco Use  ? Smoking status: Former  ?  Packs/day: 0.25  ?  Years: 30.00  ?  Pack years: 7.50  ?  Types: Cigarettes  ?  Quit date: 08/03/2015  ?  Years since quitting: 6.2  ? Smokeless tobacco: Former  ?Vaping Use  ? Vaping Use: Never used  ?Substance and Sexual Activity  ? Alcohol use: Yes  ?  Alcohol/week: 12.0 standard drinks  ?  Types: 12 Glasses of wine per week  ?  Comment: beer occassionally  ? Drug use: No  ? Sexual activity: Yes  ?Other Topics Concern  ? Not on file  ?Social History Narrative  ? Not on file  ? ?Social Determinants of Health  ? ?Financial Resource Strain: Low Risk   ? Difficulty of Paying Living Expenses: Not hard at all  ?Food Insecurity: No Food Insecurity  ? Worried About Charity fundraiser in the Last Year: Never true  ? Ran Out of Food in the Last Year: Never true  ?Transportation Needs: No Transportation Needs  ? Lack of Transportation (Medical): No  ? Lack of Transportation (Non-Medical): No  ?Physical Activity: Insufficiently Active  ? Days of Exercise per Week: 5 days  ? Minutes of Exercise per Session: 20 min  ?Stress: No Stress Concern Present  ? Feeling of Stress : Not at all  ?Social Connections: Moderately Integrated  ? Frequency of Communication with Friends and Family: More than three times a week  ? Frequency of Social Gatherings with Friends and Family: More than three times a week  ? Attends Religious Services: 1 to 4 times per year  ? Active Member of Clubs or Organizations: No  ? Attends Archivist Meetings: Never  ? Marital Status: Married  ? ? ?Tobacco Counseling ?Counseling given: Not Answered ? ? ?Clinical Intake: ? ?  ? ?  ? ?  ? ?  ? ?  ? ?Diabetic?Yes ? ?  ? ?  ? ? ?Activities of Daily Living ? ?  11/14/2021  ?  1:10  PM  ?In your present state of health, do you have any difficulty performing the following activities:  ?Hearing? 1  ?Comment with back noise present  ?Vision? 1  ?Comment eyes gets blurry  ?Difficulty concentrating or making decisions? 0  ?Walking  or climbing stairs? 0  ?Dressing or bathing? 0  ?Doing errands, shopping? 0  ?Preparing Food and eating ? N  ?Using the Toilet? N  ?In the past six months, have you accidently leaked urine? N  ?Do you have problems with loss of bowel control? N  ?Managing your Medications? N  ?Managing your Finances? N  ?Housekeeping or managing your Housekeeping? N  ? ? ?Patient Care Team: ?Early, Coralee Pesa, NP as PCP - General (Nurse Practitioner) ? ?Indicate any recent Medical Services you may have received from other than [facility] providers in the past year (date may be approximate). ? ?   ?Assessment:  ? This is a routine wellness examination for [place]. ? ?Hearing/Vision screen ?No results found. ? ?Dietary issues and exercise activities discussed: ?Current Exercise Habits: Home exercise routine, Time (Minutes): 20, Frequency (Times/Week): 7, Weekly Exercise (Minutes/Week): 140, Intensity: Mild ? ? Goals Addressed   ? ?  ?  ?  ?  ? This Visit's Progress  ?  Weight (lb) < 200 lb (90.7 kg)     ?  And to also cut carb intake.  ?  ? ?  ? ?Depression Screen ? ?  11/14/2021  ? 12:58 PM 04/25/2021  ?  9:54 AM 07/15/2020  ?  8:04 AM 01/19/2020  ?  8:08 AM 03/07/2019  ?  8:34 AM 02/02/2018  ?  8:04 AM 11/01/2017  ?  8:17 AM  ?PHQ 2/9 Scores  ?PHQ - 2 Score 0 0 0 0 0 0 0  ?PHQ- 9 Score  0  0     ?  ?Fall Risk ? ?  11/14/2021  ?  1:14 PM 04/25/2021  ?  9:54 AM 07/15/2020  ?  8:04 AM 01/19/2020  ?  8:07 AM 03/07/2019  ?  8:34 AM  ?Fall Risk   ?Falls in the past year? 0 0 0 0 0  ?Number falls in past yr: 0 0     ?Injury with Fall? 0 0     ?Risk for fall due to :  No Fall Risks No Fall Risks No Fall Risks   ?Follow up  Falls evaluation completed Falls evaluation completed Falls evaluation completed Falls evaluation  completed  ? ? ?FALL RISK PREVENTION PERTAINING TO THE HOME: ? ?Any stairs in or around the home? No  ?If so, are there any without handrails? Yes  ?Home free of loose throw rugs in walkways, pet beds, elect

## 2021-11-24 ENCOUNTER — Encounter (HOSPITAL_BASED_OUTPATIENT_CLINIC_OR_DEPARTMENT_OTHER): Payer: Self-pay | Admitting: Nurse Practitioner

## 2021-11-24 ENCOUNTER — Ambulatory Visit (INDEPENDENT_AMBULATORY_CARE_PROVIDER_SITE_OTHER): Payer: Medicare Other | Admitting: Nurse Practitioner

## 2021-11-24 VITALS — BP 129/82 | HR 76 | Resp 12

## 2021-11-24 DIAGNOSIS — E669 Obesity, unspecified: Secondary | ICD-10-CM | POA: Diagnosis not present

## 2021-11-24 DIAGNOSIS — E782 Mixed hyperlipidemia: Secondary | ICD-10-CM | POA: Diagnosis not present

## 2021-11-24 DIAGNOSIS — E559 Vitamin D deficiency, unspecified: Secondary | ICD-10-CM

## 2021-11-24 DIAGNOSIS — E118 Type 2 diabetes mellitus with unspecified complications: Secondary | ICD-10-CM

## 2021-11-24 DIAGNOSIS — K219 Gastro-esophageal reflux disease without esophagitis: Secondary | ICD-10-CM

## 2021-11-24 DIAGNOSIS — R7989 Other specified abnormal findings of blood chemistry: Secondary | ICD-10-CM | POA: Insufficient documentation

## 2021-11-24 DIAGNOSIS — H60331 Swimmer's ear, right ear: Secondary | ICD-10-CM

## 2021-11-24 DIAGNOSIS — I10 Essential (primary) hypertension: Secondary | ICD-10-CM | POA: Diagnosis not present

## 2021-11-24 DIAGNOSIS — E875 Hyperkalemia: Secondary | ICD-10-CM

## 2021-11-24 DIAGNOSIS — T7840XA Allergy, unspecified, initial encounter: Secondary | ICD-10-CM

## 2021-11-24 DIAGNOSIS — E785 Hyperlipidemia, unspecified: Secondary | ICD-10-CM

## 2021-11-24 DIAGNOSIS — L719 Rosacea, unspecified: Secondary | ICD-10-CM

## 2021-11-24 HISTORY — DX: Swimmer's ear, right ear: H60.331

## 2021-11-24 MED ORDER — OMEPRAZOLE 40 MG PO CPDR: 40.0000 mg | DELAYED_RELEASE_CAPSULE | Freq: Two times a day (BID) | ORAL | 11 refills | Status: DC

## 2021-11-24 MED ORDER — OZEMPIC (0.25 OR 0.5 MG/DOSE) 2 MG/1.5ML ~~LOC~~ SOPN: PEN_INJECTOR | SUBCUTANEOUS | 3 refills | Status: DC

## 2021-11-24 MED ORDER — CIPROFLOXACIN-DEXAMETHASONE 0.3-0.1 % OT SUSP: 4.0000 [drp] | Freq: Two times a day (BID) | OTIC | 0 refills | Status: DC

## 2021-11-24 MED ORDER — FEXOFENADINE HCL 180 MG PO TABS: 180.0000 mg | ORAL_TABLET | Freq: Every day | ORAL | 5 refills | Status: DC

## 2021-11-24 NOTE — Assessment & Plan Note (Signed)
Chronic.  Reportedly not well controlled at this time.  A1c is elevated on most recent labs. ?Discussed with patient the option to trial Ozempic to see if this is helpful with blood sugar and lipids.  Unclear at this time if medication is covered by her insurance.  We will go ahead and send in medication and plan to taper off of glipizide initially with start and slowly taper off of Januvia as dose increased and blood glucose levels are improved. ?Plan to follow-up in 3 months for repeat monitoring.  Labs few days before visit. ?

## 2021-11-24 NOTE — Assessment & Plan Note (Signed)
Chronic.  Exacerbated at this time. ?Discussed with patient recent exacerbation may be related to food choices.  Provided with handout for food choices options for GERD. ?Will increase omeprazole to twice daily dosing for 2 weeks to see if this helps with symptoms.  No alarm symptoms present today. ?Follow-up in 3 months or sooner if needed. ?

## 2021-11-24 NOTE — Assessment & Plan Note (Signed)
Intermittent blurry and grainy vision.  Suspect this is exacerbated recently with high pollen counts in the area.  Patient is not currently on any allergy medication.  Recommend trial of Xyzal I will or Allegra to help with allergy symptoms.  Monitor closely.  Additionally recommend follow-up with ophthalmology to help ensure that there is nothing additionally concerning present.  Follow-up if symptoms worsen or fail to improve. ?

## 2021-11-24 NOTE — Assessment & Plan Note (Signed)
Patient requesting refill on topical medication for rosacea today.  No medication is listed in patient's record and she is unable to remember the name. ?Discussed with patient once she gets home if she is able to find the medication name she can contact the office and we will happily send this in for her. ? ?

## 2021-11-24 NOTE — Patient Instructions (Addendum)
It was a pleasure seeing you today. I hope your time spent with [place] was pleasant and helpful. Please let us know if there is anything we can do to improve the service you receive.  ? ?Today we discussed concerns with: ? ?DM (diabetes mellitus), type 2 with complications ([place]) ?Essential hypertension ?Mixed hyperlipidemia ?Obesity (BMI 30.0-34.9) ?Vitamin D deficiency ?Hyperlipidemia, unspecified hyperlipidemia type ?Hyperkalemia ?Elevated LFTs ? ?We will also recheck your liver function tests to make sure that these are not looking worse. I have included information on fatty liver disease for you to look over ?Your goal carbohydrate intake should be 150-180 grams a day spread out throughout the day to best manage your sugars.  ?I have sent in allergy medication for you to take once daily- let me know if this does not help with the blurry vision and gritty eyes- you can also use saline drops to help flush the eyes ?I have sent in double dose of the omeprazole for you for the reflux symptoms. I would take this for at least two weeks and see if this helps get it better under control.  ?I have sent in drops for your ears for infection- this is likely from increased allergy symptoms and should help within the next couple of days.  ?I believe your neck redness is coming from an allergy to the necklace, you can try to put clear nail polish on the necklace and see if this gets better or look for a chain that does not have any nickel in it, as this is a common allergen.  ?I sent in the semaglutide- let me know if this is covered by insurance and I will call you and we can go over the recommendations for it.  ? ? ?The following orders have been placed for you today: ? ?No orders of the defined types were placed in this encounter. ? ? ? ?Important Office Information ?Lab Results ?If labs were ordered, please note that you will see results through [place] as soon as they come available from [facility].  ?It takes up to 5 business  days for the results to be routed to me and for me to review them once all of the lab results have come through from [facility]. I will make recommendations based on your results and send these through [facility] or someone from the office will call you to discuss. If your labs are abnormal, we may contact you to schedule a visit to discuss the results and make recommendations.  ?If you have not heard from [place] within 5 business days or you have waited longer than a week and your lab results have not come through on [place], please feel free to call the office or send a message through [facility] to follow-up on these labs.  ? ?Referrals ?If referrals were placed today, the office where the referral was sent will contact you either by phone or through [facility] [place] to set up scheduling. Please note that it can take up to a week for the referral office to contact you. If you do not hear from them in a week, please contact the referral office directly to inquire about scheduling.  ? ?Condition Treated ?If your condition worsens or you begin to have new symptoms, please schedule a follow-up appointment for further evaluation. If you are not sure if an appointment is needed, you may call the office to leave a message for the nurse and someone will contact you with recommendations.  ?If you have an urgent or  life threatening emergency, please do not call the office, but seek emergency evaluation by calling 911 or going to the nearest emergency room for evaluation.  ? ?MyChart and Phone Calls ?Please do not use MyChart for urgent messages. It may take up to 3 business days for MyChart messages to be read by staff and if they are unable to handle the request, an additional 3 business days for them to be routed to me and for my response.  ?Messages sent to the provider through [place] do not come directly to the provider, please allow time for these messages to be routed and for me to respond.  ?We get a large volume of MyChart messages  daily and these are responded to in the order received.  ? ?For urgent messages, please call the office at [phone] and speak with the front office staff or leave a message on the line of my assistant for guidance.  ?We are seeing patients from the hours of 8:00 am through 5:00 pm and calls directly to the nurse may not be answered immediately due to seeing patients, but your call will be returned as soon as possible.  ?Phone  messages received after 4:00 PM Monday through Thursday may not be returned until the following business day. Phone messages received after 11:00 AM on Friday may not be returned until Monday.  ? ?After Hours ?We share on call hours with providers from other offices. If you have an urgent need after hours that cannot wait until the next business day, please contact the on call provider by calling the office number. A nurse will speak with you and contact the provider if needed for recommendations.  ?If you have an urgent or life threatening emergency after hours, please do not call the on call provider, but seek emergency evaluation by calling 911 or going to the nearest emergency room for evaluation.  ? ?Paperwork ?All paperwork requires a minimum of 5 days to complete and return to you or the designated personnel. Please keep this in mind when bringing in forms or sending requests for paperwork completion to the office.  ?  ?

## 2021-11-24 NOTE — Progress Notes (Signed)
? ?Established Patient Office Visit ? ?Subjective   ?Patient ID: Jeanne Shaw, female    DOB: [birth year]  Age: 66 y.o. MRN: [id number] ? ?Chief Complaint  ?Patient presents with  ? Follow-up  ?  DM, HLD, Weight  ? ? ?HPI ?Jeanne Shaw presents today for follow-up of diabetes, weight, and HLD.  ?At her last visit her blood sugars were not very well controlled running 150's-180's fasting.  ?She was seen for her eye exam on 09/22/2021 by the retina specialist and her vision was worsening at that time. She has been referred to cataract specialist for further evaluation.  ?Her most recent A1c was in February and was 8.5%, her ALT was also elevated at that time.  ? ?Today she tells she me: ?DM ?- FBG running 169-200 on most days ?-Trying to keep carbs at 150 ?-Increasing fruits and vegetables for snacks ?-Most nights will have 3-4 crackers with PB ?-She does have some lows in the night ?-She is not eating more junk food ?-No routine exercise ?-Reports blurred vision, increased hunger ?-No urinary infections ? ?Blurry Vision ?- vision is intermittently blurry ?- concerned that this is from glipizide as she read that blurred vision can be SE ?- reports that they often feel "grainy" when they are blurry ?- no specific time of day ?- Ophthalmologist believes this may be related to cataracts ?- She reports that this does come and go and not related to current BG levels or time medication has been taken ?- no additional sx present ? ?Itchy ear ?- right ear tenderness and itchiness that has been present for about the past week ?- no fevers, chills, sinus pain/pressure ?- eyes have been blurry/grainy (see above) ? ?Red marks on neck ?- red marks on neck around area where necklace sits ?- noticed after wearing the necklace the last few times ?- seems to fade away the next day after taking the necklace off ?- did not bother her initially when she first started wearing, but after several weeks she has noticed this ?- will  occassionally itch ?- only present in area where necklace has been  ? ?Acid Reflux ?- taking omeprazole once daily ?- increased reflux symptoms of gas, bloating, burning, sore/dry throat over the past few weeks ?- no known trigger ?- no emesis, CP, fever, chills ?- epigastric tenderness present ? ?Dryness in back of throat ?- water does not help ?- mints seem to make it better ?- worse since reflux started ?- would like to know if it is caused by medication  ? ?ROS ?All review of systems negative except what is listed in the HPI ? ?  ?Objective:  ?  ? ?BP 129/82   Pulse 76   Resp 12  ?BP Readings from Last 3 Encounters:  ?11/24/21 129/82  ?04/25/21 (!) 143/68  ?08/26/20 128/72  ? ?Wt Readings from Last 3 Encounters:  ?04/25/21 172 lb 9.6 oz (78.3 kg)  ?08/26/20 171 lb 6.4 oz (77.7 kg)  ?07/15/20 173 lb (78.5 kg)  ? ? ?Physical Exam ?Vitals and nursing note reviewed.  ?Constitutional:   ?   General: She is not in acute distress. ?   Appearance: Normal appearance.  ?HENT:  ?   Head: Normocephalic.  ?   Right Ear: Hearing normal. Swelling and tenderness present. A middle ear effusion is present.  ?   Left Ear: Hearing normal. No swelling or tenderness. A middle ear effusion is present.  ?   Nose:  ?   Right Turbinates:  Swollen and pale.  ?   Left Turbinates: Swollen and pale.  ?   Mouth/Throat:  ?   Lips: Pink.  ?Eyes:  ?   General: Lids are normal. Vision grossly intact.  ?   Extraocular Movements: Extraocular movements intact.  ?   Conjunctiva/sclera: Conjunctivae normal.  ?   Funduscopic exam: ?   Right eye: No hemorrhage. Red reflex present.     ?   Left eye: No hemorrhage. Red reflex present. ?   Visual Fields: Right eye visual fields normal and left eye visual fields normal.  ?Neck:  ?   Thyroid: No thyromegaly.  ?   Vascular: No carotid bruit or JVD.  ?Cardiovascular:  ?   Rate and Rhythm: Normal rate and regular rhythm.  ?   Chest Wall: PMI is not displaced.  ?   Pulses: Normal pulses.  ?   Heart sounds:  Normal heart sounds.  ?Pulmonary:  ?   Effort: Pulmonary effort is normal.  ?   Breath sounds: Normal breath sounds.  ?Abdominal:  ?   General: Abdomen is flat. Bowel sounds are normal.  ?   Palpations: Abdomen is soft.  ?   Tenderness: There is abdominal tenderness.  ?   Comments: Epigastric tenderness present.   ?Musculoskeletal:     ?   General: Normal range of motion.  ?   Cervical back: Normal range of motion. No tenderness.  ?   Right lower leg: No edema.  ?   Left lower leg: No edema.  ?Lymphadenopathy:  ?   Cervical: No cervical adenopathy.  ?Skin: ?   General: Skin is warm and dry.  ?   Capillary Refill: Capillary refill takes less than 2 seconds.  ?   Comments: Light pink markings circling neckline with no signs of ecchymosis, edema, or scaling present.   ?Neurological:  ?   General: No focal deficit present.  ?   Mental Status: She is alert and oriented to person, place, and time.  ?   Motor: No weakness.  ?   Gait: Gait normal.  ?Psychiatric:     ?   Mood and Affect: Mood normal.     ?   Behavior: Behavior normal.     ?   Thought Content: Thought content normal.     ?   Judgment: Judgment normal.  ? ? ? ?No results found for any visits on 11/24/21. ? ?Last CBC ?Lab Results  ?Component Value Date  ? WBC 7.8 09/03/2021  ? HGB 14.6 09/03/2021  ? HCT 42.6 09/03/2021  ? MCV 92 09/03/2021  ? MCH 31.6 09/03/2021  ? RDW 12.1 09/03/2021  ? PLT 495 (H) 09/03/2021  ? ?Last metabolic panel ?Lab Results  ?Component Value Date  ? GLUCOSE 221 (H) 09/03/2021  ? NA 139 09/03/2021  ? K 5.1 09/03/2021  ? CL 101 09/03/2021  ? CO2 24 09/03/2021  ? BUN 17 09/03/2021  ? CREATININE 0.63 09/03/2021  ? EGFR 98 09/03/2021  ? CALCIUM 9.7 09/03/2021  ? PROT 6.6 09/03/2021  ? ALBUMIN 4.4 09/03/2021  ? LABGLOB 2.2 09/03/2021  ? AGRATIO 2.0 09/03/2021  ? BILITOT 0.3 09/03/2021  ? ALKPHOS 97 09/03/2021  ? AST 32 09/03/2021  ? ALT 72 (H) 09/03/2021  ? ?Last lipids ?Lab Results  ?Component Value Date  ? CHOL 128 09/03/2021  ? HDL 29 (L)  09/03/2021  ? [place] 56 09/03/2021  ? TRIG 269 (H) 09/03/2021  ? CHOLHDL 4.6 (H) 04/25/2021  ? ?Last  hemoglobin A1c ?Lab Results  ?Component Value Date  ? HGBA1C 8.5 (H) 09/03/2021  ? ? ?The ASCVD Risk score (Arnett DK, et al., 2019) failed to calculate for the following reasons: ?  The valid total cholesterol range is 130 to 320 mg/dL ? ?  ?Assessment & Plan:  ? ?Problem List Items Addressed This Visit   ? ? DM (diabetes mellitus), type 2 with complications ([place]) - Primary  ?  Chronic.  Reportedly not well controlled at this time.  A1c is elevated on most recent labs. ?Discussed with patient the option to trial Ozempic to see if this is helpful with blood sugar and lipids.  Unclear at this time if medication is covered by her insurance.  We will go ahead and send in medication and plan to taper off of glipizide initially with start and slowly taper off of Januvia as dose increased and blood glucose levels are improved. ?Plan to follow-up in 3 months for repeat monitoring.  Labs few days before visit. ? ?  ?  ? Relevant Medications  ? Semaglutide,0.25 or 0.5MG/DOS, (OZEMPIC, 0.25 OR 0.5 MG/DOSE,) 2 MG/1.5ML SOPN  ? Essential hypertension  ?  Chronic.  Well-controlled today. ?Continue current medication regimen with refills as needed. ?Labs reviewed today which showed no concerning finding for reduction in GFR at this time. ?We will plan to continue to monitor with labs in 3 months. ? ?  ?  ? Rosacea  ?  Patient requesting refill on topical medication for rosacea today.  No medication is listed in patient's record and she is unable to remember the name. ?Discussed with patient once she gets home if she is able to find the medication name she can contact the office and we will happily send this in for her. ? ? ?  ?  ? Hyperlipidemia  ?  Chronic.  LDL stable on most recent labs.  HDL is quite low.  Triglycerides are significantly elevated which correlates with elevated blood glucose readings. ?Recommend continue  statin therapy.  Additionally recommend monitoring blood sugar and keeping glucose well controlled with goal of fasting blood glucose levels less than 120.  Also recommend dietary changes with less than 13 g of satura

## 2021-11-24 NOTE — Assessment & Plan Note (Signed)
Chronic.  LDL stable on most recent labs.  HDL is quite low.  Triglycerides are significantly elevated which correlates with elevated blood glucose readings. ?Recommend continue statin therapy.  Additionally recommend monitoring blood sugar and keeping glucose well controlled with goal of fasting blood glucose levels less than 120.  Also recommend dietary changes with less than 13 g of saturated fat divided throughout the day and increased daily fiber to a minimum of 20 to 30 g/day. ?We will plan to monitor in 3 months with labs and repeat office visit. ?

## 2021-11-24 NOTE — Assessment & Plan Note (Signed)
BMI elevated and not well controlled at this time.  Suspect that elevated blood glucose readings are contributing to her difficulty losing weight. ?Recommend strict glucose monitoring and dietary restrictions to help better control blood sugar levels.  Additionally recommend daily exercise with 10 to 20-minute walk slowly increasing time and distance.  We will plan to change diabetes medication and add Ozempic today.  If insurance covers this well we will plan to stop glipizide with start of Ozempic and taper off of Januvia.  Plan to remain on metformin at this time. ?Follow-up in 3 months or sooner if needed. ?

## 2021-11-24 NOTE — Assessment & Plan Note (Signed)
ALT elevated on most recent labs.  Discussed with patient who informs me that this is chronic in nature.  She has had multiple ultrasounds and scans of the liver which have showed fatty liver.  Strongly encourage patient to work towards increased exercise and improved blood sugar control in order to help reduce body fat and risks associated with fatty liver.  We will plan to follow-up with labs in 3 months or sooner if needed.  No alarm symptoms present today. ?

## 2021-11-24 NOTE — Assessment & Plan Note (Addendum)
Chronic.  Well-controlled today. ?Continue current medication regimen with refills as needed. ?Labs reviewed today which showed no concerning finding for reduction in GFR at this time. ?We will plan to continue to monitor with labs in 3 months. ?

## 2021-11-24 NOTE — Assessment & Plan Note (Signed)
Symptoms and presentation consistent with otitis externa of the right ear.  There is mild edema noted in the ear canal with white exudate present.  Left ear normal.  Suspect this is fungal etiology.  We will send treatment with Ciprodex and monitor closely.  TM are intact bilaterally at this time. ?

## 2022-01-23 ENCOUNTER — Encounter (HOSPITAL_BASED_OUTPATIENT_CLINIC_OR_DEPARTMENT_OTHER): Payer: Self-pay | Admitting: Nurse Practitioner

## 2022-01-26 ENCOUNTER — Ambulatory Visit (HOSPITAL_BASED_OUTPATIENT_CLINIC_OR_DEPARTMENT_OTHER): Payer: Medicare Other

## 2022-02-16 ENCOUNTER — Ambulatory Visit (HOSPITAL_BASED_OUTPATIENT_CLINIC_OR_DEPARTMENT_OTHER): Payer: Medicare Other

## 2022-02-16 DIAGNOSIS — E669 Obesity, unspecified: Secondary | ICD-10-CM

## 2022-02-16 DIAGNOSIS — I10 Essential (primary) hypertension: Secondary | ICD-10-CM

## 2022-02-16 DIAGNOSIS — E118 Type 2 diabetes mellitus with unspecified complications: Secondary | ICD-10-CM

## 2022-02-16 DIAGNOSIS — E782 Mixed hyperlipidemia: Secondary | ICD-10-CM

## 2022-02-16 LAB — CBC WITH DIFFERENTIAL/PLATELET
Basophils Absolute: 0.1 10*3/uL (ref 0.0–0.2)
Basos: 1 %
EOS (ABSOLUTE): 0.3 10*3/uL (ref 0.0–0.4)
Eos: 4 %
Hematocrit: 45.1 % (ref 34.0–46.6)
Hemoglobin: 14.8 g/dL (ref 11.1–15.9)
Immature Grans (Abs): 0 10*3/uL (ref 0.0–0.1)
Immature Granulocytes: 0 %
Lymphocytes Absolute: 2.8 10*3/uL (ref 0.7–3.1)
Lymphs: 31 %
MCH: 31.1 pg (ref 26.6–33.0)
MCHC: 32.8 g/dL (ref 31.5–35.7)
MCV: 95 fL (ref 79–97)
Monocytes Absolute: 0.4 10*3/uL (ref 0.1–0.9)
Monocytes: 4 %
Neutrophils Absolute: 5.4 10*3/uL (ref 1.4–7.0)
Neutrophils: 60 %
Platelets: 578 10*3/uL — ABNORMAL HIGH (ref 150–450)
RBC: 4.76 x10E6/uL (ref 3.77–5.28)
RDW: 12.4 % (ref 11.7–15.4)
WBC: 8.9 10*3/uL (ref 3.4–10.8)

## 2022-02-16 LAB — HEMOGLOBIN A1C
Est. average glucose Bld gHb Est-mCnc: 169 mg/dL
Hgb A1c MFr Bld: 7.5 % — ABNORMAL HIGH (ref 4.8–5.6)

## 2022-02-16 LAB — COMPREHENSIVE METABOLIC PANEL
ALT: 65 IU/L — ABNORMAL HIGH (ref 0–32)
AST: 36 IU/L (ref 0–40)
Albumin/Globulin Ratio: 1.9 (ref 1.2–2.2)
Albumin: 4.6 g/dL (ref 3.9–4.9)
Alkaline Phosphatase: 111 IU/L (ref 44–121)
BUN/Creatinine Ratio: 20 (ref 12–28)
BUN: 14 mg/dL (ref 8–27)
Bilirubin Total: 0.3 mg/dL (ref 0.0–1.2)
CO2: 23 mmol/L (ref 20–29)
Calcium: 10.1 mg/dL (ref 8.7–10.3)
Chloride: 103 mmol/L (ref 96–106)
Creatinine, Ser: 0.71 mg/dL (ref 0.57–1.00)
Globulin, Total: 2.4 g/dL (ref 1.5–4.5)
Glucose: 136 mg/dL — ABNORMAL HIGH (ref 70–99)
Potassium: 5.4 mmol/L — ABNORMAL HIGH (ref 3.5–5.2)
Sodium: 141 mmol/L (ref 134–144)
Total Protein: 7 g/dL (ref 6.0–8.5)
eGFR: 94 mL/min/{1.73_m2} (ref >59)

## 2022-02-16 LAB — LIPID PANEL
Chol/HDL Ratio: 3.8 ratio (ref 0.0–4.4)
Cholesterol, Total: 107 mg/dL (ref 100–199)
HDL: 28 mg/dL — ABNORMAL LOW (ref >39)
LDL Chol Calc (NIH): 46 mg/dL (ref 0–99)
Triglycerides: 200 mg/dL — ABNORMAL HIGH (ref 0–149)
VLDL Cholesterol Cal: 33 mg/dL (ref 5–40)

## 2022-02-18 ENCOUNTER — Ambulatory Visit (HOSPITAL_BASED_OUTPATIENT_CLINIC_OR_DEPARTMENT_OTHER): Payer: Medicare Other

## 2022-02-19 ENCOUNTER — Encounter (HOSPITAL_BASED_OUTPATIENT_CLINIC_OR_DEPARTMENT_OTHER): Payer: Self-pay | Admitting: Nurse Practitioner

## 2022-02-19 ENCOUNTER — Ambulatory Visit (INDEPENDENT_AMBULATORY_CARE_PROVIDER_SITE_OTHER): Payer: Medicare Other | Admitting: Nurse Practitioner

## 2022-02-19 VITALS — BP 117/72 | HR 81 | Ht 61.0 in | Wt 167.9 lb

## 2022-02-19 DIAGNOSIS — M542 Cervicalgia: Secondary | ICD-10-CM

## 2022-02-19 DIAGNOSIS — I10 Essential (primary) hypertension: Secondary | ICD-10-CM

## 2022-02-19 DIAGNOSIS — H269 Unspecified cataract: Secondary | ICD-10-CM

## 2022-02-19 DIAGNOSIS — M25519 Pain in unspecified shoulder: Secondary | ICD-10-CM

## 2022-02-19 DIAGNOSIS — G4486 Cervicogenic headache: Secondary | ICD-10-CM | POA: Diagnosis not present

## 2022-02-19 DIAGNOSIS — E785 Hyperlipidemia, unspecified: Secondary | ICD-10-CM

## 2022-02-19 DIAGNOSIS — N8110 Cystocele, unspecified: Secondary | ICD-10-CM

## 2022-02-19 DIAGNOSIS — E118 Type 2 diabetes mellitus with unspecified complications: Secondary | ICD-10-CM

## 2022-02-19 NOTE — Patient Instructions (Signed)
I am going to send the referral to physical therapy for the neck and back. We will start here to see if this helps. If not, please let me know and we will look at other ideas  I will send the referral for your bladder- I am not sure if we need to do urology or gynecology, but I will find out the best option  Use the aquaphor on the vaginal opening daily and see if this helps- if not, let me know  Nuts, seeds, berries, beans, whole grains can all help with the cholesterol.

## 2022-02-19 NOTE — Progress Notes (Signed)
[name], DNP, AGNP-c [address] [place], [place] [address] [phone] Office [phone] Fax  ESTABLISHED PATIENT- Chronic Health and/or Follow-Up Visit  Blood pressure 117/72, pulse 81, height '5\' 1"'$  (1.549 m), weight 167 lb 14.4 oz (76.2 kg), SpO2 98 %.  Follow-up (Patient presents today for follow up and go over labs. )   HPI  Jeanne Shaw  is a 66 y.o. year old female presenting today for evaluation and management of the following: Cataracts  She will be seeing a new provider next month for specific evaluation Eye doctor next month for DM eye exam Tension headaches Feels these are low grade Does not typically have a history of headaches Feels that these may be coming from the neck Back Pain Endorses more frequent back pain Bladder Prolapse She endorses concerns over prolapsed bladder  She is experiencing irritation to the vaginal opening and she feels this may be from the bladder falling and rubbing She does not see a gynecologist at this time She would like to discuss her lab results.   ROS All ROS negative with exception of what is listed in HPI  PHYSICAL EXAM Physical Exam Vitals and nursing note reviewed.  Constitutional:      General: She is not in acute distress.    Appearance: Normal appearance.  HENT:     Head: Normocephalic.  Eyes:     Extraocular Movements: Extraocular movements intact.     Conjunctiva/sclera: Conjunctivae normal.     Pupils: Pupils are equal, round, and reactive to light.  Neck:     Vascular: No carotid bruit.  Cardiovascular:     Rate and Rhythm: Normal rate and regular rhythm.     Pulses: Normal pulses.     Heart sounds: Normal heart sounds. No murmur heard. Pulmonary:     Effort: Pulmonary effort is normal.     Breath sounds: Normal breath sounds. No wheezing.  Abdominal:     General: Bowel sounds are normal. There is no distension.      Palpations: Abdomen is soft.     Tenderness: There is no abdominal tenderness. There is no guarding.  Musculoskeletal:        General: Normal range of motion.     Cervical back: Normal range of motion and neck supple.     Right lower leg: No edema.     Left lower leg: No edema.  Lymphadenopathy:     Cervical: No cervical adenopathy.  Skin:    General: Skin is warm and dry.     Capillary Refill: Capillary refill takes less than 2 seconds.  Neurological:     General: No focal deficit present.     Mental Status: She is alert and oriented to person, place, and time.  Psychiatric:        Mood and Affect: Mood normal.        Behavior: Behavior normal.        Thought Content: Thought content normal.        Judgment: Judgment normal.     ASSESSMENT & PLAN Problem List Items Addressed This Visit     DM (diabetes mellitus), type 2 with complications ([place])    Chronic.  Improved control with recent A1c after addition of Ozempic.  She has now stopped taking the Januvia.  We will plan to continue with her current treatment regimen and monitor closely.  No alarm symptoms present at this time.      Hyperlipidemia  LDL cholesterol under good control. Lower than desired HDL. Recommend increased whole wheat, beans, nuts, seeds, berries in diet and use of olive oil when cooking with oils to help improve HDL.  Triglycerides are also elevated however this is improved from her last visit.  Recommend continuation of glucose control      Cataract of both eyes    Appointment for evaluation scheduled for next month.  She will ask them to send a copy of the report to our office.      Fallen bladder - Primary    History of bladder prolapse with symptoms consistent with previous symptoms present today.  We will send referral to gynecology for complete evaluation and recommendations.  Recommend Aquaphor to vaginal opening to help with irritation present.      Relevant Orders   Ambulatory referral to  Obstetrics / Gynecology   Neck and shoulder pain    Neck and shoulder pain with cervicogenic type headaches.  Recommend physical therapy for symptom relief.  We will send referral today.      Relevant Orders   Ambulatory referral to Physical Therapy   Cervicogenic headache    Low-grade headaches starting in the cervical spine wrapping around to the temporal and frontal area consistent with cervicogenic headache.  Suspect that this is likely due to cervical muscle spasming and tension.  We will send for physical therapy for neck and shoulders to see if this is helpful for symptom management.       Relevant Orders   Ambulatory referral to Physical Therapy   Essential hypertension     FOLLOW-UP Return in about 3 months (around 05/22/2022) for 40 min- DM, HTN, HLD.   [name], DNP, AGNP-c 02/19/2022  2:35 PM

## 2022-02-23 ENCOUNTER — Ambulatory Visit (HOSPITAL_BASED_OUTPATIENT_CLINIC_OR_DEPARTMENT_OTHER): Payer: Medicare Other | Admitting: Nurse Practitioner

## 2022-02-26 NOTE — Addendum Note (Signed)
Addended by: Karle Barr on: 02/26/2022 03:27 PM   Modules accepted: Level of Service

## 2022-03-18 NOTE — Progress Notes (Signed)
[facility] Note  03/23/2022     CHIEF COMPLAINT Patient presents for Retina Follow Up  HISTORY OF PRESENT ILLNESS: Jeanne Shaw is a 66 y.o. female who presents to the clinic today for:   HPI     Retina Follow Up   Patient presents with  Dry AMD.  In both eyes.  Severity is moderate.  Duration of 6 months.  Since onset it is stable.  I, the attending physician,  performed the HPI with the patient and updated documentation appropriately.        Comments   Patient states vision may be slightly worse OU. Scheduled for CE with IOL OS on 08.31.23 and CE with IOL OD on 09.14.23 with [facility].       Last edited by Bernarda Caffey, MD on 03/23/2022  8:21 AM.    pt has cataract sx scheduled with Dr. Katy Fitch (OS: 08.31.23, OD: 09.14.23)  Referring physician: Orlena Sheldon, PA-C No address on file  HISTORICAL INFORMATION:   Selected notes from the [facility] Referred by M. Dixon, PA-C for DM exam Ocular Hx-  PMH- DM (A1C 7.4 on 04.01.19), HTN, asthma,     CURRENT MEDICATIONS: No current outpatient medications on file. (Ophthalmic Drugs)   No current facility-administered medications for this visit. (Ophthalmic Drugs)   Current Outpatient Medications (Other)  Medication Sig   Ascorbic Acid (VITAMIN C) 1000 MG tablet Take 1,000 mg by mouth daily.   aspirin EC 81 MG tablet Take 81 mg by mouth daily.   Cholecalciferol (VITAMIN D) 125 MCG (5000 UT) CAPS Take 5,000 Units by mouth daily.   ciprofloxacin-dexamethasone (CIPRODEX) OTIC suspension Place 4 drops into the right ear 2 (two) times daily. For 5-7 days. Place drops in ear and allow to drain into ear canal then place cotton ball at opening to keep medication in place.   fexofenadine (ALLEGRA) 180 MG tablet Take 1 tablet (180 mg total) by mouth daily. For allergies   Flaxseed, Linseed, (FLAX SEEDS PO) Take 1 tablet by mouth daily.    lisinopril (ZESTRIL) 10 MG tablet Take  1 tablet (10 mg total) by mouth daily.   meclizine (ANTIVERT) 12.5 MG tablet Take 1 tablet (12.5 mg total) by mouth 3 (three) times daily as needed for dizziness.   metFORMIN (GLUCOPHAGE) 1000 MG tablet Take 1 tablet (1,000 mg total) by mouth 2 (two) times daily with a meal. TAKE 1 TABLET BY MOUTH TWICE DAILY WITH A MEAL   MILK THISTLE PO Take 1 tablet by mouth at bedtime.    Multiple Vitamins-Minerals (PRESERVISION AREDS) CAPS Take 1 capsule by mouth at bedtime.    Omega-3 Fatty Acids (FISH OIL) 1000 MG CAPS Take 1 capsule (1,000 mg total) by mouth daily. (Patient taking differently: Take 1,000 mg by mouth daily.)   omeprazole (PRILOSEC) 40 MG capsule Take 1 capsule (40 mg total) by mouth 2 (two) times daily. Take in the morning and at dinnertime. (Patient taking differently: Take 40 mg by mouth once.)   Red Yeast Rice Extract (RED YEAST RICE PO) Take 1 tablet by mouth daily.    Semaglutide,0.25 or 0.'5MG'$ /DOS, (OZEMPIC, 0.25 OR 0.5 MG/DOSE,) 2 MG/1.5ML SOPN Inject 0.'25mg'$  into skin once a week for 4 weeks. THEN inject 0.'5mg'$  into skin once a week for 2 weeks to complete pen   simvastatin (ZOCOR) 10 MG tablet Take 1 tablet (10 mg total) by mouth at bedtime.   sitaGLIPtin (JANUVIA) 100 MG tablet  Take 1 tablet (100 mg total) by mouth daily.   sodium chloride (OCEAN) 0.65 % SOLN nasal spray Place 1 spray into both nostrils daily as needed for congestion.   Zinc 50 MG CAPS    glipiZIDE (GLUCOTROL) 5 MG tablet Take 1 tablet (5 mg total) by mouth daily before breakfast. Try to take about 30 minutes before eating. (Patient not taking: Reported on 02/19/2022)   No current facility-administered medications for this visit. (Other)   REVIEW OF SYSTEMS: ROS   Positive for: Skin, Endocrine, Eyes Negative for: Constitutional, Gastrointestinal, Neurological, Genitourinary, Musculoskeletal, HENT, Cardiovascular, Respiratory, Psychiatric, Allergic/Imm, Heme/Lymph Last edited by Roselee Nova D, COT on 03/23/2022   7:42 AM.     ALLERGIES Allergies  Allergen Reactions   Wilder Glade [Dapagliflozin] Other (See Comments)    Mycotic infections   Hydrocodone Anxiety   PAST MEDICAL HISTORY Past Medical History:  Diagnosis Date   Allergy    Anxiety    Asthma    Cataract    OU   Diabetes mellitus without complication ([place])    GERD (gastroesophageal reflux disease)    Hypertension    Macular degeneration    Dry OU   Subacute vaginitis 04/25/2021   Past Surgical History:  Procedure Laterality Date   BIOPSY  03/26/2020   Procedure: BIOPSY;  Surgeon: Harvel Quale, MD;  [facility];  Service: Gastroenterology;;   COLONOSCOPY WITH PROPOFOL N/A 03/26/2020   Procedure: COLONOSCOPY WITH PROPOFOL;  Surgeon: Harvel Quale, MD;  [facility];  Service: Gastroenterology;  Laterality: N/A;  815   CYST EXCISION     off vocal cord    POLYPECTOMY  03/26/2020   Procedure: POLYPECTOMY;  Surgeon: Montez Morita, Quillian Quince, MD;  [facility];  Service: Gastroenterology;;    FAMILY HISTORY Family History  Problem Relation Age of Onset   COPD Mother    Alcohol abuse Father    Diabetes Father    Heart disease Father    Hyperlipidemia Father    Hypertension Father    Cancer Sister    Diabetes Sister    Hyperlipidemia Sister    Hypertension Sister    Breast cancer Sister 25   Hypertension Brother    Alcohol abuse Maternal Aunt    Alcohol abuse Maternal Uncle    Hypertension Maternal Uncle    Alcohol abuse Paternal Aunt    Cancer Paternal Aunt    Alcohol abuse Paternal Uncle    Cancer Paternal Uncle    Diabetes Maternal Grandmother    Stroke Maternal Grandmother    Heart disease Maternal Grandfather    SOCIAL HISTORY Social History   Tobacco Use   Smoking status: Former    Packs/day: 0.25    Years: 30.00    Total pack years: 7.50    Types: Cigarettes    Quit date: 08/03/2015    Years since quitting: 6.6   Smokeless tobacco: Former  IT trainer Use: Never used  Substance Use Topics   Alcohol use: Yes    Alcohol/week: 12.0 standard drinks of alcohol    Types: 12 Glasses of wine per week    Comment: beer occassionally   Drug use: No       OPHTHALMIC EXAM:  Base Eye Exam     Visual Acuity (Snellen - Linear)       Right Left   Dist cc 20/20 20/30   Dist ph cc  20/20 -2    Correction: Glasses  Tonometry (Tonopen, 7:49 AM)       Right Left   Pressure 14 15         Pupils       Dark Light Shape React APD   Right 3 2 Round Brisk None   Left 3 2 Round Brisk None         Visual Fields (Counting fingers)       Left Right    Full Full         Extraocular Movement       Right Left    Full, Ortho Full, Ortho         Neuro/Psych     Oriented x3: Yes   Mood/Affect: Normal         Dilation     Both eyes: 1.0% Mydriacyl, 2.5% Phenylephrine @ 7:49 AM           Slit Lamp and Fundus Exam     Slit Lamp Exam       Right Left   Lids/Lashes Dermatochalasis - upper lid Dermatochalasis - upper lid   Conjunctiva/Sclera nasal and temporal pinguecula nasal and temporal pinguecula   Cornea Trace Punctate epithelial erosions, trace tear film debris, fine endo pigment Trace Punctate epithelial erosions, trace tear film debris, fine endo pigment   Anterior Chamber Deep and quiet Deep and quiet   Iris Round and dilated, No NVI Round and dilated, No NVI   Lens 2-3+ Nuclear sclerosis, 2-3+ Cortical cataract 2-3+ Nuclear sclerosis, 2-3+ Cortical cataract   Anterior Vitreous Mild Vitreous syneresis Mild Vitreous syneresis         Fundus Exam       Right Left   Disc Pink and Sharp Pink and Sharp, Compact   C/D Ratio 0.3 0.2   Macula flat, blunted foveal reflex, mild RPE mottling and clumping, focal druse/pigment clump superonasal to fovea, No heme or edema Flat, Blunted foveal reflex, drusen, mild RPE mottling, clumping and early atrophy, +PEDs, No heme or edema   Vessels  attenuated, Tortuous, mild AV crossing changes, mild Copper wiring attenuated, Tortuous, AV crossing changes, mild Copper wiring   Periphery Attached, focal peripheral drusen nasally, reticular degeneration, No heme Attached, patch of pavingstone at 0600, mild reticular degeneration, No heme            IMAGING AND PROCEDURES  Imaging and Procedures for 11/16/17  OCT, Retina - OU - Both Eyes       Right Eye Quality was good. Central Foveal Thickness: 284. Progression has been stable. Findings include normal foveal contour, no IRF, no SRF, retinal drusen , outer retinal atrophy, vitreomacular adhesion .   Left Eye Quality was good. Central Foveal Thickness: 301. Progression has been stable. Findings include normal foveal contour, no IRF, no SRF, retinal drusen , pigment epithelial detachment, outer retinal atrophy, vitreomacular adhesion (?mild progression of ORA).   Notes *Images captured and stored on drive  Diagnosis / Impression: NFP, No IRF/SRF OU Drusen OU No DME OU OS: ?mild progression of ORA  Clinical management:  See below  Abbreviations: NFP - Normal foveal profile. CME - cystoid macular edema. PED - pigment epithelial detachment. IRF - intraretinal fluid. SRF - subretinal fluid. EZ - ellipsoid zone. ERM - epiretinal membrane. ORA - outer retinal atrophy. ORT - outer retinal tubulation. SRHM - subretinal hyper-reflective material              ASSESSMENT/PLAN:    ICD-10-CM   1. Intermediate stage nonexudative age-related macular degeneration of  both eyes  H35.3132 OCT, Retina - OU - Both Eyes    2. Diabetes mellitus type 2 without retinopathy ([place])  E11.9     3. Essential hypertension  I10     4. Hypertensive retinopathy of both eyes  H35.033     5. Combined forms of age-related cataract of both eyes  H25.813      1. Non-Exudative ARMD OU-   - Intermediate stage (OS > OD)  - scattered drusen and early ORA  - OCT shows ?progression of ORA OS  - BCVA  remains 20/20 OU  - continue amsler grid monitoring  - f/u 6-9 months for repeat DFE/OCT  2. Diabetes mellitus, type 2 without retinopathy  - last a1c 7.5 on 07.17.23  - The incidence, risk factors for progression, natural history and treatment options for diabetic retinopathy  were discussed with patient.    - The need for close monitoring of blood glucose, blood pressure, and serum lipids, avoiding cigarette or any type of tobacco, and the need for long term follow up was also discussed with patient.  - monitor   3,4. Hypertensive retinopathy OU - discussed importance of tight BP control - monitor  5. Combined form age-related cataract OU-  - The symptoms of cataract, surgical options, and treatments and risks were discussed with patient.  - pt reports significant glare symptoms  - discussed diagnosis and progression  - surgery scheduled for left eye on April 02, 2022 w/ Dr. Shirleen Schirmer  Ophthalmic Meds Ordered this visit:  No orders of the defined types were placed in this encounter.    Return for f/u 6-9 months, non-exu ARMD OU, DFE, OCT.  There are no Patient Instructions on file for this visit.   Explained the diagnoses, plan, and follow up with the patient and they expressed understanding.  Patient expressed understanding of the importance of proper follow up care.    This document serves as a record of services personally performed by Gardiner Sleeper, MD, PhD. It was created on their behalf by Roselee Nova, COMT. The creation of this record is the provider's dictation and/or activities during the visit.  Electronically signed by: Roselee Nova, COMT 03/23/22 8:25 AM  This document serves as a record of services personally performed by Gardiner Sleeper, MD, PhD. It was created on their behalf by San Jetty. Owens Shark, OA an ophthalmic technician. The creation of this record is the provider's dictation and/or activities during the visit.    Electronically signed by: San Jetty. Owens Shark,  [place] 08.21.2023 8:25 AM  Gardiner Sleeper, M.D., Ph.D. Diseases & Surgery of the Retina and Vitreous [facility]  I have reviewed the above documentation for accuracy and completeness, and I agree with the above. Gardiner Sleeper, M.D., Ph.D. 03/23/22 8:26 AM    Abbreviations: M myopia (nearsighted); A astigmatism; H hyperopia (farsighted); P presbyopia; Mrx spectacle prescription;  CTL contact lenses; OD right eye; OS left eye; OU both eyes  XT exotropia; ET esotropia; PEK punctate epithelial keratitis; PEE punctate epithelial erosions; DES dry eye syndrome; MGD meibomian gland dysfunction; ATs artificial tears; PFAT's preservative free artificial tears; [place] nuclear sclerotic cataract; PSC posterior subcapsular cataract; ERM epi-retinal membrane; PVD posterior vitreous detachment; RD retinal detachment; DM diabetes mellitus; DR diabetic retinopathy; NPDR non-proliferative diabetic retinopathy; PDR proliferative diabetic retinopathy; CSME clinically significant macular edema; DME diabetic macular edema; dbh dot blot hemorrhages; CWS cotton wool spot; POAG primary open angle glaucoma; C/D cup-to-disc ratio; HVF humphrey  visual field; GVF goldmann visual field; OCT optical coherence tomography; IOP intraocular pressure; BRVO Branch retinal vein occlusion; CRVO central retinal vein occlusion; CRAO central retinal artery occlusion; BRAO branch retinal artery occlusion; RT retinal tear; SB scleral buckle; PPV pars plana vitrectomy; VH Vitreous hemorrhage; PRP panretinal laser photocoagulation; IVK intravitreal kenalog; VMT vitreomacular traction; MH Macular hole;  NVD neovascularization of the disc; NVE neovascularization elsewhere; AREDS age related eye disease study; ARMD age related macular degeneration; POAG primary open angle glaucoma; EBMD epithelial/anterior basement membrane dystrophy; ACIOL anterior chamber intraocular lens; IOL intraocular lens; PCIOL posterior chamber intraocular  lens; Phaco/IOL phacoemulsification with intraocular lens placement; [place] photorefractive keratectomy; LASIK laser assisted in situ keratomileusis; HTN hypertension; DM diabetes mellitus; COPD chronic obstructive pulmonary disease

## 2022-03-23 ENCOUNTER — Ambulatory Visit (INDEPENDENT_AMBULATORY_CARE_PROVIDER_SITE_OTHER): Payer: Medicare Other | Admitting: Ophthalmology

## 2022-03-23 ENCOUNTER — Encounter (INDEPENDENT_AMBULATORY_CARE_PROVIDER_SITE_OTHER): Payer: Self-pay | Admitting: Ophthalmology

## 2022-03-23 DIAGNOSIS — H25813 Combined forms of age-related cataract, bilateral: Secondary | ICD-10-CM

## 2022-03-23 DIAGNOSIS — I10 Essential (primary) hypertension: Secondary | ICD-10-CM

## 2022-03-23 DIAGNOSIS — H353132 Nonexudative age-related macular degeneration, bilateral, intermediate dry stage: Secondary | ICD-10-CM

## 2022-03-23 DIAGNOSIS — H35033 Hypertensive retinopathy, bilateral: Secondary | ICD-10-CM | POA: Diagnosis not present

## 2022-03-23 DIAGNOSIS — E119 Type 2 diabetes mellitus without complications: Secondary | ICD-10-CM

## 2022-03-28 NOTE — Therapy (Signed)
OUTPATIENT PHYSICAL THERAPY CERVICAL EVALUATION   Patient Name: Jeanne Shaw MRN: [id number] DOB:1955-10-17, 66 y.o., female Today's Date: 03/31/2022   PT End of Session - 03/30/22 0855     Visit Number 1    Number of Visits 12    Date for PT Re-Evaluation 06/08/22    Authorization Type MCR part A and B    PT Start Time 0805    PT Stop Time 0850    PT Time Calculation (min) 45 min    Activity Tolerance Patient tolerated treatment well    Behavior During Therapy [facility] for tasks assessed/performed             Past Medical History:  Diagnosis Date   Allergy    Anxiety    Asthma    Cataract    OU   Diabetes mellitus without complication ([place])    GERD (gastroesophageal reflux disease)    Hypertension    Macular degeneration    Dry OU   Subacute vaginitis 04/25/2021   Past Surgical History:  Procedure Laterality Date   BIOPSY  03/26/2020   Procedure: BIOPSY;  Surgeon: Harvel Quale, MD;  [facility];  Service: Gastroenterology;;   COLONOSCOPY WITH PROPOFOL N/A 03/26/2020   Procedure: COLONOSCOPY WITH PROPOFOL;  Surgeon: Harvel Quale, MD;  [facility];  Service: Gastroenterology;  Laterality: N/A;  815   CYST EXCISION     off vocal cord    POLYPECTOMY  03/26/2020   Procedure: POLYPECTOMY;  Surgeon: Harvel Quale, MD;  [facility];  Service: Gastroenterology;;   Patient Active Problem List   Diagnosis Date Noted   Elevated LFTs 11/24/2021   Gastroesophageal reflux disease 11/24/2021   Acute swimmer's ear of right side 11/24/2021   Allergies 11/24/2021   Cataract of both eyes 08/25/2021   Encounter for medical examination to establish care 04/25/2021   Leg pain, posterior, left 04/25/2021   Obesity (BMI 30.0-34.9) 09/02/2018   Hyperlipidemia 02/02/2018   DM (diabetes mellitus), type 2 with complications ([place]) 93/90/3009   Essential hypertension 08/02/2017   Rosacea 08/02/2017      REFERRING PROVIDER: [name], NP   REFERRING DIAG: M54.2,M25.519 (ICD-10-CM) - Neck and shoulder pain G44.86 (ICD-10-CM) - Cervicogenic headache   THERAPY DIAG:  Cervicalgia  Pain in thoracic spine  Other muscle spasm  Abnormal posture  Rationale for Evaluation and Treatment Rehabilitation  ONSET DATE: MD order 02/19/2022  SUBJECTIVE:  SUBJECTIVE STATEMENT: -Pt has had pain approx 8-10 years though feels that she has had tension for 20 years.  Pt reports no specific injury that caused the pain.  She did fall in 2005 injuring L elbow and doesn't know if that contributed to the pain.  Pt had PT for L elbow.  Pt states her pain began worsening a few months ago and she brought it up to her MD on 7/20.  Pt has received exercises from MD  approx 6-7 years ago which she has performed.  She states it helped a little but then the pain returned.  Pt has tried heat which didn't help.    -Pt states she has tension in cervical and thoracic region which produce tension type H/A's.  Pt states she has a mild H/A currently and has H/A's 4-5 days per week.  Pt states her H/A's do not reach a migraine level.  Pt has pain with lifting > 20 lbs.  Pt reports she feels the tension/pain with daily activities though it doesn't limit and she doesn't recall specific activities that make it worse.     PERTINENT HISTORY:  Chronic pain DM Type 2, Macular degeneration, vertigo, occasional LBP  PAIN:  Are you having pain? Yes: NPRS scale: Current:  4-5/10 ; Worst:  6/10 ; Best: 1 /10 Pain location: cervical, UT, thoracic, and scapular ; worst in bilat UT Pain description: dull, tension, pressure Aggravating factors: Pt reports no specific factors.  Relieving factors: massages  PRECAUTIONS: Other: chronic  pain  WEIGHT BEARING RESTRICTIONS No  FALLS:  Has patient fallen in last 6 months? No    OCCUPATION: Pt is retired  PLOF: Independent  PATIENT GOALS decrease pain level consistently to 0-1/10  OBJECTIVE:   DIAGNOSTIC FINDINGS:  CT in March 2023 for post neck mass:   IMPRESSION: Palpable mass likely reflects a lipoma  PATIENT SURVEYS:  FOTO 68 with a goal of 66 at visit #8   COGNITION: Overall cognitive status: Within functional limits for tasks assessed     POSTURE: rounded shoulders, forward head, and increased thoracic kyphosis  PALPATION:  Significant tightness in bilat UT, thoracic paraspinals, and medial scap mm.  Pt was also tender in those areas.    CERVICAL ROM:   Active ROM A/PROM (deg) eval  Flexion WFL  Extension 54 with tension  Right lateral flexion 32 with tension   Left lateral flexion 42 with tension  Right rotation WFL  Left rotation WFL   (Blank rows = not tested)  UPPER EXTREMITY ROM:  Active ROM Right eval Left eval  Shoulder flexion 158 with tension 153 with tension  Shoulder extension    Shoulder abduction 140 with tension 148 with tension  Shoulder adduction    Shoulder extension    Shoulder internal rotation    Shoulder external rotation    Elbow flexion    Elbow extension    Wrist flexion    Wrist extension    Wrist ulnar deviation    Wrist radial deviation    Wrist pronation    Wrist supination     (Blank rows = not tested)  UPPER EXTREMITY MMT:  MMT Right eval Left eval  Shoulder flexion 5/5 5/5  Shoulder extension    Shoulder abduction 5/5 5/5  Shoulder adduction    Shoulder extension    Shoulder internal rotation Intact with arm at side Intact with arm at side  Shoulder external rotation Intact with arm at side Intact with arm at side  Middle trapezius    Lower trapezius    Elbow flexion    Elbow extension    Wrist flexion    Wrist extension    Wrist ulnar deviation    Wrist radial deviation     Wrist pronation    Wrist supination    Grip strength     (Blank rows = not tested)  CERVICAL SPECIAL TESTS:  Compression Test:  negative Spurling's Test:  negative    TODAY'S TREATMENT:  Educated pt on correct posture and the importance of posture.  PT demonstrated correct standing and sitting posture.     See below for further pt education.   PATIENT EDUCATION:  Education details: POC, exercise rationale, posture, dx, objective findings, and prognosis.  PT answered pt's questions. Person educated: Patient Education method: Explanation, Demonstration, and Verbal cues Education comprehension: verbalized understanding, returned demonstration, verbal cues required, and needs further education   HOME EXERCISE PROGRAM: Will give at a later date  ASSESSMENT:  CLINICAL IMPRESSION: Patient is a 66 y.o. female with a dx of Neck and shoulder pain and Cervicogenic headache presenting to the clinic with cervicalgia, thoracic pain, muscle spasm, and abnormal posture.  Pt has chronic pain though exacerbated a few months ago.  Pt states she has tension in cervical and thoracic region which produce tension type H/A's which she has 4-5 days per week.  Pt has pain with lifting > 20 lbs.  Pt reports she feels the tension/pain with daily activities though it doesn't limit her.  Pt has postural deficits and significant tightness in bilat UT, thoracic paraspinals, and medial scap mm.  Pt has good strength in bilat shoulders.  Pt has limited cervical Sb'ing AROM on R.  Pt should benefit from skilled PT services.    OBJECTIVE IMPAIRMENTS decreased activity tolerance, decreased ROM, hypomobility, increased muscle spasms, impaired flexibility, postural dysfunction, and pain.   ACTIVITY LIMITATIONS lifting  PARTICIPATION LIMITATIONS:   PERSONAL FACTORS Chronic and 1-2 comorbidities: DM Type 2 and occasional LBP  are also affecting patient's functional outcome.   REHAB POTENTIAL: Good  CLINICAL  DECISION MAKING: Evolving/moderate complexity  EVALUATION COMPLEXITY: Low   GOALS:  SHORT TERM GOALS: Target date: 05/11/2022  Pt will be independent and compliant with HEP for improved pain, ROM, function, and posture.  Baseline:  Goal status: INITIAL  2.  Pt will demo at least an 8 deg increase in R Sb'ing for improved ROM, stiffness, and flexibility.  Baseline: R SB:  32 deg Goal status: INITIAL  3.  Pt will report at least a 25% improvement in pain and sx's overall.  Baseline:  Goal status: INITIAL   LONG TERM GOALS: Target date: 06/08/2022  Pt will report at least a 70% improvement in pain and sx's overall.  Baseline:  Goal status: INITIAL  2.  Pt will demo improved posture with reduced forward head and rounded shoulders for improved postural alignment and reduced stress on her cervical and thoracic spine for improved pain with daily activities.  Baseline:  Goal status: INITIAL  3.  Pt will perform postural stabilization exercises without adverse effects for improved postural strength for reduced stress on spine and for improved performance of household chores.  Baseline:  Goal status: INITIAL  4.  Pt will report having H/A's no > than 2 days/week. Baseline:  Goal status: INITIAL   PLAN: PT FREQUENCY: 2x/week  PT DURATION: other: 10 weeks ; Pt will not be seen for weeks due to cataract surgery  PLANNED INTERVENTIONS: Therapeutic exercises,  Therapeutic activity, Neuromuscular re-education, Patient/Family education, Self Care, Joint mobilization, Aquatic Therapy, Dry Needling, Electrical stimulation, Cryotherapy, Moist heat, Taping, Ultrasound, Manual therapy, and Re-evaluation  PLAN FOR NEXT SESSION:  Pt states she is having cataract surgery this Thursday and 9/14.  Pt to see MD when cleared after her 2nd cataract surgery.   STW to bilat UT, cervical, and medial scap mm.  Educate on theracane.  Postural strengthening and stabilization.   Selinda Michaels III PT,  DPT 03/31/22 2:06 PM

## 2022-03-30 ENCOUNTER — Ambulatory Visit (HOSPITAL_BASED_OUTPATIENT_CLINIC_OR_DEPARTMENT_OTHER): Payer: Medicare Other | Attending: Nurse Practitioner | Admitting: Physical Therapy

## 2022-03-30 ENCOUNTER — Other Ambulatory Visit: Payer: Self-pay

## 2022-03-30 ENCOUNTER — Encounter (HOSPITAL_BASED_OUTPATIENT_CLINIC_OR_DEPARTMENT_OTHER): Payer: Self-pay | Admitting: Physical Therapy

## 2022-03-30 DIAGNOSIS — M25519 Pain in unspecified shoulder: Secondary | ICD-10-CM | POA: Diagnosis not present

## 2022-03-30 DIAGNOSIS — M62838 Other muscle spasm: Secondary | ICD-10-CM | POA: Diagnosis present

## 2022-03-30 DIAGNOSIS — M546 Pain in thoracic spine: Secondary | ICD-10-CM | POA: Diagnosis present

## 2022-03-30 DIAGNOSIS — G4486 Cervicogenic headache: Secondary | ICD-10-CM | POA: Diagnosis not present

## 2022-03-30 DIAGNOSIS — M542 Cervicalgia: Secondary | ICD-10-CM | POA: Diagnosis not present

## 2022-03-30 DIAGNOSIS — R293 Abnormal posture: Secondary | ICD-10-CM | POA: Diagnosis present

## 2022-04-05 ENCOUNTER — Other Ambulatory Visit (HOSPITAL_BASED_OUTPATIENT_CLINIC_OR_DEPARTMENT_OTHER): Payer: Self-pay | Admitting: Nurse Practitioner

## 2022-04-05 DIAGNOSIS — E118 Type 2 diabetes mellitus with unspecified complications: Secondary | ICD-10-CM

## 2022-04-08 DIAGNOSIS — M25519 Pain in unspecified shoulder: Secondary | ICD-10-CM | POA: Insufficient documentation

## 2022-04-08 DIAGNOSIS — G4486 Cervicogenic headache: Secondary | ICD-10-CM

## 2022-04-08 DIAGNOSIS — N8110 Cystocele, unspecified: Secondary | ICD-10-CM | POA: Insufficient documentation

## 2022-04-08 HISTORY — DX: Cervicogenic headache: G44.86

## 2022-04-08 HISTORY — DX: Pain in unspecified shoulder: M25.519

## 2022-04-08 NOTE — Assessment & Plan Note (Signed)
Appointment for evaluation scheduled for next month.  She will ask them to send a copy of the report to our office.

## 2022-04-08 NOTE — Assessment & Plan Note (Signed)
Neck and shoulder pain with cervicogenic type headaches.  Recommend physical therapy for symptom relief.  We will send referral today.

## 2022-04-08 NOTE — Assessment & Plan Note (Signed)
History of bladder prolapse with symptoms consistent with previous symptoms present today.  We will send referral to gynecology for complete evaluation and recommendations.  Recommend Aquaphor to vaginal opening to help with irritation present.

## 2022-04-08 NOTE — Assessment & Plan Note (Signed)
LDL cholesterol under good control. Lower than desired HDL. Recommend increased whole wheat, beans, nuts, seeds, berries in diet and use of olive oil when cooking with oils to help improve HDL.  Triglycerides are also elevated however this is improved from her last visit.  Recommend continuation of glucose control

## 2022-04-08 NOTE — Assessment & Plan Note (Signed)
Low-grade headaches starting in the cervical spine wrapping around to the temporal and frontal area consistent with cervicogenic headache.  Suspect that this is likely due to cervical muscle spasming and tension.  We will send for physical therapy for neck and shoulders to see if this is helpful for symptom management.

## 2022-04-08 NOTE — Assessment & Plan Note (Signed)
Chronic.  Improved control with recent A1c after addition of Ozempic.  She has now stopped taking the Januvia.  We will plan to continue with her current treatment regimen and monitor closely.  No alarm symptoms present at this time.

## 2022-04-24 ENCOUNTER — Other Ambulatory Visit (HOSPITAL_BASED_OUTPATIENT_CLINIC_OR_DEPARTMENT_OTHER): Payer: Self-pay | Admitting: Nurse Practitioner

## 2022-04-24 DIAGNOSIS — T7840XA Allergy, unspecified, initial encounter: Secondary | ICD-10-CM

## 2022-04-30 ENCOUNTER — Encounter (HOSPITAL_BASED_OUTPATIENT_CLINIC_OR_DEPARTMENT_OTHER): Payer: Self-pay | Admitting: Physical Therapy

## 2022-04-30 ENCOUNTER — Ambulatory Visit (HOSPITAL_BASED_OUTPATIENT_CLINIC_OR_DEPARTMENT_OTHER): Payer: Medicare Other | Attending: Nurse Practitioner | Admitting: Physical Therapy

## 2022-04-30 DIAGNOSIS — M546 Pain in thoracic spine: Secondary | ICD-10-CM | POA: Insufficient documentation

## 2022-04-30 DIAGNOSIS — M62838 Other muscle spasm: Secondary | ICD-10-CM | POA: Insufficient documentation

## 2022-04-30 DIAGNOSIS — M542 Cervicalgia: Secondary | ICD-10-CM | POA: Insufficient documentation

## 2022-04-30 DIAGNOSIS — R293 Abnormal posture: Secondary | ICD-10-CM | POA: Diagnosis present

## 2022-04-30 NOTE — Therapy (Signed)
OUTPATIENT PHYSICAL THERAPY TREATMENT NOTE   Patient Name: Jeanne Shaw MRN: [id number] DOB:[birth year], 66 y.o., female Today's Date: 04/30/2022   PT End of Session - 04/30/22 1553     Visit Number 2    Number of Visits 12    Date for PT Re-Evaluation 06/08/22    Authorization Type MCR part A and B    PT Start Time 1521    PT Stop Time 1555    PT Time Calculation (min) 34 min    Activity Tolerance Patient tolerated treatment well    Behavior During Therapy WFL for tasks assessed/performed              Past Medical History:  Diagnosis Date   Allergy    Anxiety    Asthma    Cataract    OU   Diabetes mellitus without complication ([place])    GERD (gastroesophageal reflux disease)    Hypertension    Macular degeneration    Dry OU   Subacute vaginitis 04/25/2021   Past Surgical History:  Procedure Laterality Date   BIOPSY  03/26/2020   Procedure: BIOPSY;  Surgeon: Harvel Quale, MD;  [facility];  Service: Gastroenterology;;   COLONOSCOPY WITH PROPOFOL N/A 03/26/2020   Procedure: COLONOSCOPY WITH PROPOFOL;  Surgeon: Harvel Quale, MD;  [facility];  Service: Gastroenterology;  Laterality: N/A;  815   CYST EXCISION     off vocal cord    POLYPECTOMY  03/26/2020   Procedure: POLYPECTOMY;  Surgeon: Harvel Quale, MD;  [facility];  Service: Gastroenterology;;   Patient Active Problem List   Diagnosis Date Noted   Fallen bladder 04/08/2022   Neck and shoulder pain 04/08/2022   Cervicogenic headache 04/08/2022   Elevated LFTs 11/24/2021   Gastroesophageal reflux disease 11/24/2021   Acute swimmer's ear of right side 11/24/2021   Allergies 11/24/2021   Cataract of both eyes 08/25/2021   Encounter for medical examination to establish care 04/25/2021   Leg pain, posterior, left 04/25/2021   Obesity (BMI 30.0-34.9) 09/02/2018   Hyperlipidemia 02/02/2018   DM (diabetes mellitus), type 2 with  complications ([place]) 47/82/9562   Essential hypertension 08/02/2017   Rosacea 08/02/2017     REFERRING PROVIDER: [name], NP   REFERRING DIAG: M54.2,M25.519 (ICD-10-CM) - Neck and shoulder pain G44.86 (ICD-10-CM) - Cervicogenic headache   THERAPY DIAG:  Cervicalgia  Pain in thoracic spine  Other muscle spasm  Abnormal posture  Rationale for Evaluation and Treatment Rehabilitation  ONSET DATE: MD order 02/19/2022  SUBJECTIVE:  SUBJECTIVE STATEMENT: -Pt states she has tension in cervical and thoracic region which produce tension type H/A's.   -Pt had her 2nd cataract surgery 2 weeks ago and was cleared by MD to return to PT after 9/21.  She has no restrictions.  Pt states her pain and sx's have been consistent.  Pt denies any adverse effects after prior Rx.  Pt denies having a H/A currently.    PERTINENT HISTORY:  Chronic pain DM Type 2, Macular degeneration, vertigo, occasional LBP  PAIN:  Are you having pain? Yes: NPRS scale: Current:  3/10 ; Worst:  6/10 ; Best: 1 /10 Pain location:  bilat sides of cervical and bilat UT Pain description: dull, tension, pressure Aggravating factors: Pt reports no specific factors.  Relieving factors: massages  PRECAUTIONS: Other: chronic pain  WEIGHT BEARING RESTRICTIONS No  FALLS:  Has patient fallen in last 6 months? No    OCCUPATION: Pt is retired  PLOF: Independent  PATIENT GOALS decrease pain level consistently to 0-1/10  OBJECTIVE:   DIAGNOSTIC FINDINGS:  CT in March 2023 for post neck mass:   IMPRESSION: Palpable mass likely reflects a lipoma   TODAY'S TREATMENT:  Therapeutic Exercise: Reviewed pt presentation, response to prior Rx, and pain level. Pt performed:  Chin tucks 2x12-15  Scap retraction 2x10  reps  Seated Sb'ing stretch 3x20-30 sec bilat PT established HEP.  Pt received a HEP handout and was educated in correct form and appropriate frequency.  Pt instructed she should not have pain with HEP.  Educated pt on correct posture and the importance of posture.     Manual Therapy: Pt received STM to bilat UT, mid trap, and medial scap mm with trigger pt release to bilat UT in sitting to improve soft tissue tightness and pain and reduce myofascial restrictions and adhesions.     PATIENT EDUCATION:  Education details: POC, exercise rationale, posture, exercise form, and HEP.  PT answered pt's questions. Person educated: Patient Education method: Explanation, Demonstration, and Verbal cues Education comprehension: verbalized understanding, returned demonstration, verbal cues required, and needs further education   HOME EXERCISE PROGRAM: Access Code: [email or web address] URL: [email or web address] Date: 04/30/2022 Prepared by: Ronny Flurry  Exercises - Seated Cervical Retraction  - 2 x daily - 7 x weekly - 2 sets - 10 reps - Standing Scapular Retraction  - 2 x daily - 7 x weekly - 2 sets - 10 reps  ASSESSMENT:  CLINICAL IMPRESSION: Pt has been absent from PT due to having cataract surgery.  She has been released by MD to return to PT and has no restrictions per pt.  Pt has significant tightness in bilat UT with mm spasms.  Pt had referred pain occasionally with trigger pt release to UT.  Pt reports having minimally increased pain after STW.  PT provided HEP focusing on postural exercises.  She required cuing and instruction for correct form with cervical retraction.  Pt reports having increased pain to 3.5/10 in thoracic and reduced cervical/UT pain to 1.5-2/10 after Rx.  Pt should benefit from skilled PT services to address goals and improve function.   OBJECTIVE IMPAIRMENTS decreased activity tolerance, decreased ROM, hypomobility, increased muscle spasms, impaired flexibility,  postural dysfunction, and pain.   ACTIVITY LIMITATIONS lifting  PARTICIPATION LIMITATIONS:   PERSONAL FACTORS Chronic and 1-2 comorbidities: DM Type 2 and occasional LBP  are also affecting patient's functional outcome.   REHAB POTENTIAL: Good  CLINICAL DECISION MAKING: Evolving/moderate complexity  EVALUATION COMPLEXITY: Low   GOALS:  SHORT TERM  GOALS: Target date: 05/11/2022  Pt will be independent and compliant with HEP for improved pain, ROM, function, and posture.  Baseline:  Goal status: INITIAL  2.  Pt will demo at least an 8 deg increase in R Sb'ing for improved ROM, stiffness, and flexibility.  Baseline: R SB:  32 deg Goal status: INITIAL  3.  Pt will report at least a 25% improvement in pain and sx's overall.  Baseline:  Goal status: INITIAL   LONG TERM GOALS: Target date: 06/08/2022  Pt will report at least a 70% improvement in pain and sx's overall.  Baseline:  Goal status: INITIAL  2.  Pt will demo improved posture with reduced forward head and rounded shoulders for improved postural alignment and reduced stress on her cervical and thoracic spine for improved pain with daily activities.  Baseline:  Goal status: INITIAL  3.  Pt will perform postural stabilization exercises without adverse effects for improved postural strength for reduced stress on spine and for improved performance of household chores.  Baseline:  Goal status: INITIAL  4.  Pt will report having H/A's no > than 2 days/week. Baseline:  Goal status: INITIAL   PLAN: PT FREQUENCY: 2x/week  PT DURATION: other: 10 weeks ; Pt will not be seen for weeks due to cataract surgery  PLANNED INTERVENTIONS: Therapeutic exercises, Therapeutic activity, Neuromuscular re-education, Patient/Family education, Self Care, Joint mobilization, Aquatic Therapy, Dry Needling, Electrical stimulation, Cryotherapy, Moist heat, Taping, Ultrasound, Manual therapy, and Re-evaluation  PLAN FOR NEXT SESSION:  STW to  bilat UT, cervical, and medial scap mm.  Educate on theracane.  Postural strengthening and stabilization.   Selinda Michaels III PT, DPT 04/30/22 10:05 PM

## 2022-05-01 ENCOUNTER — Encounter (HOSPITAL_BASED_OUTPATIENT_CLINIC_OR_DEPARTMENT_OTHER): Payer: Self-pay | Admitting: Nurse Practitioner

## 2022-05-04 ENCOUNTER — Other Ambulatory Visit (HOSPITAL_BASED_OUTPATIENT_CLINIC_OR_DEPARTMENT_OTHER): Payer: Self-pay

## 2022-05-04 DIAGNOSIS — T7840XA Allergy, unspecified, initial encounter: Secondary | ICD-10-CM

## 2022-05-04 MED ORDER — FEXOFENADINE HCL 180 MG PO TABS: ORAL_TABLET | ORAL | 0 refills | Status: DC

## 2022-05-05 ENCOUNTER — Encounter (HOSPITAL_BASED_OUTPATIENT_CLINIC_OR_DEPARTMENT_OTHER): Payer: Self-pay | Admitting: Physical Therapy

## 2022-05-05 ENCOUNTER — Ambulatory Visit (HOSPITAL_BASED_OUTPATIENT_CLINIC_OR_DEPARTMENT_OTHER): Payer: Medicare Other | Attending: Nurse Practitioner | Admitting: Physical Therapy

## 2022-05-05 DIAGNOSIS — M62838 Other muscle spasm: Secondary | ICD-10-CM | POA: Diagnosis present

## 2022-05-05 DIAGNOSIS — M546 Pain in thoracic spine: Secondary | ICD-10-CM | POA: Diagnosis present

## 2022-05-05 DIAGNOSIS — M542 Cervicalgia: Secondary | ICD-10-CM | POA: Diagnosis present

## 2022-05-05 DIAGNOSIS — R293 Abnormal posture: Secondary | ICD-10-CM | POA: Insufficient documentation

## 2022-05-05 NOTE — Therapy (Signed)
OUTPATIENT PHYSICAL THERAPY TREATMENT NOTE   Patient Name: Jeanne Shaw MRN: [id number] DOB:1955/08/07, 66 y.o., female Today's Date: 05/06/2022  PT end of session  Visit Number 3    Number of Visits 12     Date for PT Re-Evaluation 06/08/22     Authorization Type MCR part A and B     PT Start Time 1148    PT Stop Time 1231    PT Time Calculation (min) 19mn     Activity Tolerance Patient tolerated treatment well     Behavior During Therapy WFL for tasks assessed/performed         Past Medical History:  Diagnosis Date   Allergy    Anxiety    Asthma    Cataract    OU   Diabetes mellitus without complication ([place]    GERD (gastroesophageal reflux disease)    Hypertension    Macular degeneration    Dry OU   Subacute vaginitis 04/25/2021   Past Surgical History:  Procedure Laterality Date   BIOPSY  03/26/2020   Procedure: BIOPSY;  Surgeon: CHarvel Quale MD;  [facility];  Service: Gastroenterology;;   COLONOSCOPY WITH PROPOFOL N/A 03/26/2020   Procedure: COLONOSCOPY WITH PROPOFOL;  Surgeon: CHarvel Quale MD;  [facility];  Service: Gastroenterology;  Laterality: N/A;  815   CYST EXCISION     off vocal cord    POLYPECTOMY  03/26/2020   Procedure: POLYPECTOMY;  Surgeon: CHarvel Quale MD;  [facility];  Service: Gastroenterology;;   Patient Active Problem List   Diagnosis Date Noted   Fallen bladder 04/08/2022   Neck and shoulder pain 04/08/2022   Cervicogenic headache 04/08/2022   Elevated LFTs 11/24/2021   Gastroesophageal reflux disease 11/24/2021   Acute swimmer's ear of right side 11/24/2021   Allergies 11/24/2021   Cataract of both eyes 08/25/2021   Encounter for medical examination to establish care 04/25/2021   Leg pain, posterior, left 04/25/2021   Obesity (BMI 30.0-34.9) 09/02/2018   Hyperlipidemia 02/02/2018   DM (diabetes mellitus), [place] 2 with complications [place]  130/04/2329  Essential hypertension 08/02/2017   Rosacea 08/02/2017     REFERRING PROVIDER: EOrma Render NP   REFERRING DIAG: M54.2,M25.519 (ICD-10-CM) - Neck and shoulder pain G44.86 (ICD-10-CM) - Cervicogenic headache   THERAPY DIAG:  Cervicalgia  Pain in thoracic spine  Other muscle spasm  Abnormal posture  Rationale for Evaluation and Treatment Rehabilitation  ONSET DATE: MD order 02/19/2022  SUBJECTIVE:  SUBJECTIVE STATEMENT: -Pt states she has tension in cervical and thoracic region which produce tension type H/A's.   -Pt had her 2nd cataract surgery 2 weeks ago and was cleared by MD to return to PT after 9/21.  She has no restrictions.  Pt states her pain and sx's have been consistent.  Pt denies any adverse effects after prior Rx.  Pt denies having a H/A currently.    PERTINENT HISTORY:  Chronic pain DM Type 2, Macular degeneration, vertigo, occasional LBP  PAIN:  Are you having pain? Yes: NPRS scale: Current:  2.5-3/10 ; Worst:  6/10 ; Best: 1 /10 Pain location:  bilat sides of cervical and bilat UT Pain description: dull, tension, pressure Aggravating factors: Pt reports no specific factors.  Relieving factors: massages  PRECAUTIONS: Other: chronic pain  WEIGHT BEARING RESTRICTIONS No  FALLS:  Has patient fallen in last 6 months? No    OCCUPATION: Pt is retired  PLOF: Independent  PATIENT GOALS decrease pain level consistently to 0-1/10  OBJECTIVE:   DIAGNOSTIC FINDINGS:  CT in March 2023 for post neck mass:   IMPRESSION: Palpable mass likely reflects a lipoma   TODAY'S TREATMENT:  Therapeutic Exercise: Reviewed pt presentation, response to prior Rx, and pain level. Pt performed:  Chin tucks 2x10  Scap retraction 2x10 reps  Seated Sb'ing  stretch 3x20-30 sec bilat.  Rows with scap retraction with RTB 2x10 reps  Standing shoulder extension with retraction with RTB 2x10 PT established HEP.  Pt received a HEP handout and was educated in correct form and appropriate frequency.  Pt instructed she should not have pain with HEP.    Manual Therapy: Pt received STM to R UT, medial scap mm, mid trap with trigger point release to UT and IASTM to bilat UT, mid trap, and medial scap mm in sitting to improve soft tissue tightness and pain and reduce myofascial restrictions and adhesions.  Educated pt with and demonstrated using a theracane.  Pt used the theracane with direction from PT.      PATIENT EDUCATION:  Education details: POC, exercise rationale, posture, exercise form, and HEP.  PT answered pt's questions. Person educated: Patient Education method: Explanation, Demonstration, and Verbal cues Education comprehension: verbalized understanding, returned demonstration, verbal cues required, and needs further education   HOME EXERCISE PROGRAM: Access Code: [email or web address] URL: [email or web address] Date: 04/30/2022 Prepared by: Ronny Flurry  Exercises - Seated Cervical Retraction  - 2 x daily - 7 x weekly - 2 sets - 10 reps - Standing Scapular Retraction  - 2 x daily - 7 x weekly - 2 sets - 10 reps  Updated HEP: - Seated Upper Trapezius Stretch  - 1-2 x daily - 7 x weekly - 2-3 reps - 20-30 seconds hold - Standing Shoulder Row with Anchored Resistance  - 1 x daily - 4 x weekly - 2 sets - 10 reps  ASSESSMENT:  CLINICAL IMPRESSION: PT educated pt with using a theracane to improve soft tissue tightness and restrictions.   Pt has significant tightness in bilat UT with mm spasms and had an appropriate response to IASTM.  PT reviewed HEP and updated HEP today.  Pt performed exercises well with cuing and instruction for correct form.  She responded well to RX having no increased pain after Rx.  Pt should benefit from skilled  PT services to address goals and improve function.   OBJECTIVE IMPAIRMENTS decreased activity tolerance, decreased ROM, hypomobility, increased muscle spasms, impaired flexibility, postural dysfunction, and pain.   ACTIVITY  LIMITATIONS lifting  PARTICIPATION LIMITATIONS:   PERSONAL FACTORS Chronic and 1-2 comorbidities: DM Type 2 and occasional LBP  are also affecting patient's functional outcome.   REHAB POTENTIAL: Good  CLINICAL DECISION MAKING: Evolving/moderate complexity  EVALUATION COMPLEXITY: Low   GOALS:  SHORT TERM GOALS: Target date: 05/11/2022  Pt will be independent and compliant with HEP for improved pain, ROM, function, and posture.  Baseline:  Goal status: INITIAL  2.  Pt will demo at least an 8 deg increase in R Sb'ing for improved ROM, stiffness, and flexibility.  Baseline: R SB:  32 deg Goal status: INITIAL  3.  Pt will report at least a 25% improvement in pain and sx's overall.  Baseline:  Goal status: INITIAL   LONG TERM GOALS: Target date: 06/08/2022  Pt will report at least a 70% improvement in pain and sx's overall.  Baseline:  Goal status: INITIAL  2.  Pt will demo improved posture with reduced forward head and rounded shoulders for improved postural alignment and reduced stress on her cervical and thoracic spine for improved pain with daily activities.  Baseline:  Goal status: INITIAL  3.  Pt will perform postural stabilization exercises without adverse effects for improved postural strength for reduced stress on spine and for improved performance of household chores.  Baseline:  Goal status: INITIAL  4.  Pt will report having H/A's no > than 2 days/week. Baseline:  Goal status: INITIAL   PLAN: PT FREQUENCY: 2x/week  PT DURATION: other: 10 weeks ; Pt will not be seen for weeks due to cataract surgery  PLANNED INTERVENTIONS: Therapeutic exercises, Therapeutic activity, Neuromuscular re-education, Patient/Family education, Self Care,  Joint mobilization, Aquatic Therapy, Dry Needling, Electrical stimulation, Cryotherapy, Moist heat, Taping, Ultrasound, Manual therapy, and Re-evaluation  PLAN FOR NEXT SESSION:  STW to bilat UT, cervical, and medial scap mm.  Postural strengthening and stabilization.   Selinda Michaels III PT, DPT 05/06/22 10:33 PM

## 2022-05-07 ENCOUNTER — Ambulatory Visit (HOSPITAL_BASED_OUTPATIENT_CLINIC_OR_DEPARTMENT_OTHER): Payer: Medicare Other | Admitting: Physical Therapy

## 2022-05-07 ENCOUNTER — Encounter (HOSPITAL_BASED_OUTPATIENT_CLINIC_OR_DEPARTMENT_OTHER): Payer: Self-pay | Admitting: Physical Therapy

## 2022-05-07 DIAGNOSIS — M62838 Other muscle spasm: Secondary | ICD-10-CM

## 2022-05-07 DIAGNOSIS — M546 Pain in thoracic spine: Secondary | ICD-10-CM

## 2022-05-07 DIAGNOSIS — R293 Abnormal posture: Secondary | ICD-10-CM

## 2022-05-07 DIAGNOSIS — M542 Cervicalgia: Secondary | ICD-10-CM

## 2022-05-07 NOTE — Therapy (Signed)
OUTPATIENT PHYSICAL THERAPY TREATMENT NOTE   Patient Name: Jeanne Shaw MRN: [id number] DOB:1955-08-14, 66 y.o., female Today's Date: 05/07/2022      PT End of Session - 05/07/22 0941     Visit Number 4    Number of Visits 12    Date for PT Re-Evaluation 06/08/22    Authorization Type MCR part A and B    PT Start Time 0934    PT Stop Time 1009    PT Time Calculation (min) 35 min    Activity Tolerance Patient tolerated treatment well    Behavior During Therapy [facility] for tasks assessed/performed               Past Medical History:  Diagnosis Date   Allergy    Anxiety    Asthma    Cataract    OU   Diabetes mellitus without complication ([place])    GERD (gastroesophageal reflux disease)    Hypertension    Macular degeneration    Dry OU   Subacute vaginitis 04/25/2021   Past Surgical History:  Procedure Laterality Date   BIOPSY  03/26/2020   Procedure: BIOPSY;  Surgeon: Harvel Quale, MD;  [facility];  Service: Gastroenterology;;   COLONOSCOPY WITH PROPOFOL N/A 03/26/2020   Procedure: COLONOSCOPY WITH PROPOFOL;  Surgeon: Harvel Quale, MD;  [facility];  Service: Gastroenterology;  Laterality: N/A;  815   CYST EXCISION     off vocal cord    POLYPECTOMY  03/26/2020   Procedure: POLYPECTOMY;  Surgeon: Harvel Quale, MD;  [facility];  Service: Gastroenterology;;   Patient Active Problem List   Diagnosis Date Noted   Fallen bladder 04/08/2022   Neck and shoulder pain 04/08/2022   Cervicogenic headache 04/08/2022   Elevated LFTs 11/24/2021   Gastroesophageal reflux disease 11/24/2021   Acute swimmer's ear of right side 11/24/2021   Allergies 11/24/2021   Cataract of both eyes 08/25/2021   Encounter for medical examination to establish care 04/25/2021   Leg pain, posterior, left 04/25/2021   Obesity (BMI 30.0-34.9) 09/02/2018   Hyperlipidemia 02/02/2018   DM (diabetes mellitus),  type 2 with complications ([place]) 96/22/2979   Essential hypertension 08/02/2017   Rosacea 08/02/2017     REFERRING PROVIDER: [name], NP   REFERRING DIAG: M54.2,M25.519 (ICD-10-CM) - Neck and shoulder pain G44.86 (ICD-10-CM) - Cervicogenic headache   THERAPY DIAG:  Cervicalgia  Pain in thoracic spine  Other muscle spasm  Abnormal posture  Rationale for Evaluation and Treatment Rehabilitation  ONSET DATE: MD order 02/19/2022  SUBJECTIVE:  SUBJECTIVE STATEMENT: Pt denies having a H/A currently and states her H/A's have not been as bad.  Pt states she felt fine after prior Rx.  Pt states she has insomnia last night and the pain is starting to kick in now.     PERTINENT HISTORY:  Chronic pain DM Type 2, Macular degeneration, vertigo, occasional LBP  PAIN:  Are you having pain? Yes: NPRS scale: Current:  4/10 ; Worst:  6/10 ; Best: 1 /10 Pain location:  bilat sides of cervical and bilat UT Pain description: dull, tension, pressure Aggravating factors: Pt reports no specific factors.  Relieving factors: massages  PRECAUTIONS: Other: chronic pain  WEIGHT BEARING RESTRICTIONS No  FALLS:  Has patient fallen in last 6 months? No    OCCUPATION: Pt is retired  PLOF: Independent  PATIENT GOALS decrease pain level consistently to 0-1/10  OBJECTIVE:   DIAGNOSTIC FINDINGS:  CT in March 2023 for post neck mass:   IMPRESSION: Palpable mass likely reflects a lipoma   TODAY'S TREATMENT:  Therapeutic Exercise: Reviewed pt presentation, response to prior Rx, and pain level. Pt performed:  Chin tucks 2x10 and with YTB x 10 reps  Seated Sb'ing stretch 3x20-30 sec bilat.  Standing ER with YTB with scap retraction 2x10  Rows with scap retraction with RTB 2x10 reps  Standing  shoulder extension with retraction with RTB 2x10  Manual Therapy: Pt received STM to R UT, medial scap mm, mid trap with gentle trigger point release to UT and IASTM to bilat UT, mid trap, and medial scap mm in sitting to improve soft tissue tightness and pain and reduce myofascial restrictions and adhesions.     PATIENT EDUCATION:  Education details: POC, exercise rationale, posture, exercise form, and HEP.  PT answered pt's questions. Person educated: Patient Education method: Explanation, Demonstration, and Verbal cues Education comprehension: verbalized understanding, returned demonstration, verbal cues required, and needs further education   HOME EXERCISE PROGRAM: Access Code: [email or web address] URL: [email or web address] Date: 04/30/2022 Prepared by: Ronny Flurry  Exercises - Seated Cervical Retraction  - 2 x daily - 7 x weekly - 2 sets - 10 reps - Standing Scapular Retraction  - 2 x daily - 7 x weekly - 2 sets - 10 reps  Updated HEP: - Seated Upper Trapezius Stretch  - 1-2 x daily - 7 x weekly - 2-3 reps - 20-30 seconds hold - Standing Shoulder Row with Anchored Resistance  - 1 x daily - 4 x weekly - 2 sets - 10 reps  ASSESSMENT:  CLINICAL IMPRESSION: Pt reports her H/A's have not been as bad since starting PT.  Pt required cuing and instruction in correct form with exercises.  Pt felt the beginning pressure of a H/A with exercises which went away with MT.  Pt had an appropriate response to IASTM with multiple petichiae.  She had increased response on L side compared to R especially at superomedial scapula.  PT showed pt her skin response in mirror and educated pt in appropriate response.  Pt also instructed to drink water after MT.  Pt responded well to Rx having no increased pain after Rx.   Pt should benefit from skilled PT services to address goals and improve function.   OBJECTIVE IMPAIRMENTS decreased activity tolerance, decreased ROM, hypomobility, increased muscle  spasms, impaired flexibility, postural dysfunction, and pain.   ACTIVITY LIMITATIONS lifting  PARTICIPATION LIMITATIONS:   PERSONAL FACTORS Chronic and 1-2 comorbidities: DM Type 2 and occasional LBP  are also affecting patient's functional outcome.  REHAB POTENTIAL: Good  CLINICAL DECISION MAKING: Evolving/moderate complexity  EVALUATION COMPLEXITY: Low   GOALS:  SHORT TERM GOALS: Target date: 05/11/2022  Pt will be independent and compliant with HEP for improved pain, ROM, function, and posture.  Baseline:  Goal status: INITIAL  2.  Pt will demo at least an 8 deg increase in R Sb'ing for improved ROM, stiffness, and flexibility.  Baseline: R SB:  32 deg Goal status: INITIAL  3.  Pt will report at least a 25% improvement in pain and sx's overall.  Baseline:  Goal status: INITIAL   LONG TERM GOALS: Target date: 06/08/2022  Pt will report at least a 70% improvement in pain and sx's overall.  Baseline:  Goal status: INITIAL  2.  Pt will demo improved posture with reduced forward head and rounded shoulders for improved postural alignment and reduced stress on her cervical and thoracic spine for improved pain with daily activities.  Baseline:  Goal status: INITIAL  3.  Pt will perform postural stabilization exercises without adverse effects for improved postural strength for reduced stress on spine and for improved performance of household chores.  Baseline:  Goal status: INITIAL  4.  Pt will report having H/A's no > than 2 days/week. Baseline:  Goal status: INITIAL   PLAN: PT FREQUENCY: 2x/week  PT DURATION: other: 10 weeks ; Pt will not be seen for weeks due to cataract surgery  PLANNED INTERVENTIONS: Therapeutic exercises, Therapeutic activity, Neuromuscular re-education, Patient/Family education, Self Care, Joint mobilization, Aquatic Therapy, Dry Needling, Electrical stimulation, Cryotherapy, Moist heat, Taping, Ultrasound, Manual therapy, and  Re-evaluation  PLAN FOR NEXT SESSION:  STW to bilat UT, cervical, and medial scap mm.  Postural strengthening and stabilization.   Selinda Michaels III PT, DPT 05/07/22 10:13 PM

## 2022-05-11 ENCOUNTER — Other Ambulatory Visit (HOSPITAL_BASED_OUTPATIENT_CLINIC_OR_DEPARTMENT_OTHER): Payer: Self-pay | Admitting: Nurse Practitioner

## 2022-05-11 DIAGNOSIS — E118 Type 2 diabetes mellitus with unspecified complications: Secondary | ICD-10-CM

## 2022-05-19 ENCOUNTER — Ambulatory Visit (HOSPITAL_BASED_OUTPATIENT_CLINIC_OR_DEPARTMENT_OTHER): Payer: Medicare Other | Admitting: Physical Therapy

## 2022-05-19 DIAGNOSIS — M542 Cervicalgia: Secondary | ICD-10-CM | POA: Diagnosis not present

## 2022-05-19 DIAGNOSIS — M546 Pain in thoracic spine: Secondary | ICD-10-CM

## 2022-05-19 DIAGNOSIS — R293 Abnormal posture: Secondary | ICD-10-CM

## 2022-05-19 DIAGNOSIS — M62838 Other muscle spasm: Secondary | ICD-10-CM

## 2022-05-19 NOTE — Therapy (Signed)
OUTPATIENT PHYSICAL THERAPY TREATMENT NOTE   Patient Name: Jeanne Shaw MRN: [id number] DOB:[birth year], 66 y.o., female Today's Date: 05/20/2022      PT End of Session - 05/19/22 1027     Visit Number 5    Number of Visits 12    Date for PT Re-Evaluation 06/08/22    Authorization Type MCR part A and B    PT Start Time 0932    PT Stop Time 1013    PT Time Calculation (min) 41 min    Activity Tolerance Patient tolerated treatment well    Behavior During Therapy WFL for tasks assessed/performed                Past Medical History:  Diagnosis Date   Allergy    Anxiety    Asthma    Cataract    OU   Diabetes mellitus without complication (HCC)    GERD (gastroesophageal reflux disease)    Hypertension    Macular degeneration    Dry OU   Subacute vaginitis 04/25/2021   Past Surgical History:  Procedure Laterality Date   BIOPSY  03/26/2020   Procedure: BIOPSY;  Surgeon: Dolores Frame, MD;  [facility];  Service: Gastroenterology;;   COLONOSCOPY WITH PROPOFOL N/A 03/26/2020   Procedure: COLONOSCOPY WITH PROPOFOL;  Surgeon: Dolores Frame, MD;  [facility];  Service: Gastroenterology;  Laterality: N/A;  815   CYST EXCISION     off vocal cord    POLYPECTOMY  03/26/2020   Procedure: POLYPECTOMY;  Surgeon: Dolores Frame, MD;  [facility];  Service: Gastroenterology;;   Patient Active Problem List   Diagnosis Date Noted   Fallen bladder 04/08/2022   Neck and shoulder pain 04/08/2022   Cervicogenic headache 04/08/2022   Elevated LFTs 11/24/2021   Gastroesophageal reflux disease 11/24/2021   Acute swimmer's ear of right side 11/24/2021   Allergies 11/24/2021   Cataract of both eyes 08/25/2021   Encounter for medical examination to establish care 04/25/2021   Leg pain, posterior, left 04/25/2021   Obesity (BMI 30.0-34.9) 09/02/2018   Hyperlipidemia 02/02/2018   DM (diabetes mellitus),  type 2 with complications (HCC) 08/02/2017   Essential hypertension 08/02/2017   Rosacea 08/02/2017     REFERRING PROVIDER: Tollie Eth, NP   REFERRING DIAG: M54.2,M25.519 (ICD-10-CM) - Neck and shoulder pain G44.86 (ICD-10-CM) - Cervicogenic headache   THERAPY DIAG:  Cervicalgia  Pain in thoracic spine  Other muscle spasm  Abnormal posture  Rationale for Evaluation and Treatment Rehabilitation  ONSET DATE: MD order 02/19/2022  SUBJECTIVE:  SUBJECTIVE STATEMENT: Pt denies having a H/A currently and states her H/A's have not been as bad.  Pt reports reduced tension in upper back though still has tension in bilat UT.  Pt states she thinks she was fine after prior Rx.  Pt reports 55% improvement overall in pain and sx's.     PERTINENT HISTORY:  Chronic pain DM Type 2, Macular degeneration, vertigo, occasional LBP  PAIN:  Are you having pain? Yes: NPRS scale: Current:  2/10 ; Worst:  6/10 ; Best: 1 /10 Pain location:  bilat UT Pain description: dull, tension, pressure Aggravating factors: Pt reports no specific factors.  Relieving factors: massages  PRECAUTIONS: Other: chronic pain  WEIGHT BEARING RESTRICTIONS No  FALLS:  Has patient fallen in last 6 months? No    OCCUPATION: Pt is retired  PLOF: Independent  PATIENT GOALS decrease pain level consistently to 0-1/10  OBJECTIVE:   DIAGNOSTIC FINDINGS:  CT in March 2023 for post neck mass:   IMPRESSION: Palpable mass likely reflects a lipoma   TODAY'S TREATMENT:    Therapeutic Exercise: Reviewed pt presentation, response to prior Rx, and pain level. Pt performed:  seated Sb'ing stretch 3x20-30 sec bilat.  Standing ER with YTB with scap retraction 2x10  Rows with scap retraction with GTB 2x10 reps  Standing  shoulder extension with retraction with GTB 2x10  Manual Therapy: Pt received STM to R UT, medial scap mm, mid trap with trigger point release to UT and IASTM to bilat UT, mid trap, and medial scap mm in sitting and also STM to cervical paraspinals and suboccipitals in supine to improve soft tissue tightness and pain and reduce myofascial restrictions and adhesions.   Cervical SB AROM Before Rx:  R/L:  36/32 deg After STM/IASTM:  43/40     PATIENT EDUCATION:  Education details: POC, exercise rationale, posture, exercise form, and HEP.  PT answered pt's questions. Person educated: Patient Education method: Explanation, Demonstration, and Verbal cues Education comprehension: verbalized understanding, returned demonstration, verbal cues required, and needs further education   HOME EXERCISE PROGRAM: Access Code: [email or web address] URL: [email or web address] Date: 04/30/2022 Prepared by: Aaron Edelman   ASSESSMENT:  CLINICAL IMPRESSION: Pt is making progress with pain and sx's as evidenced by her subjective reports including reduced H/A's, reduced tension, and 55% improvement overall.  Pt presents to Rx with decreased cervical L SB AROM though improved R SB AROM.  Pt demonstrates improved cervical SB AROM bilat after STM/IASTM.  Pt had an appropriate response to IASTM.  She performed exercises well with minimal cues for correct form.  Pt responded well to Rx reporting having hardly any tension in R side and 1-2/10 pain on L side.  Pt should benefit from skilled PT services to address goals and improve function.   OBJECTIVE IMPAIRMENTS decreased activity tolerance, decreased ROM, hypomobility, increased muscle spasms, impaired flexibility, postural dysfunction, and pain.   ACTIVITY LIMITATIONS lifting  PARTICIPATION LIMITATIONS:   PERSONAL FACTORS Chronic and 1-2 comorbidities: DM Type 2 and occasional LBP  are also affecting patient's functional outcome.   REHAB POTENTIAL:  Good  CLINICAL DECISION MAKING: Evolving/moderate complexity  EVALUATION COMPLEXITY: Low   GOALS:  SHORT TERM GOALS: Target date: 05/11/2022  Pt will be independent and compliant with HEP for improved pain, ROM, function, and posture.  Baseline:  Goal status: GOAL MET  2.  Pt will demo at least an 8 deg increase in R Sb'ing for improved ROM, stiffness, and flexibility.  Baseline: R SB:  32 deg Goal status:  INITIAL  3.  Pt will report at least a 25% improvement in pain and sx's overall.  Baseline:  Goal status:  GOAL MET   LONG TERM GOALS: Target date: 06/08/2022  Pt will report at least a 70% improvement in pain and sx's overall.  Baseline:  Goal status: INITIAL  2.  Pt will demo improved posture with reduced forward head and rounded shoulders for improved postural alignment and reduced stress on her cervical and thoracic spine for improved pain with daily activities.  Baseline:  Goal status: INITIAL  3.  Pt will perform postural stabilization exercises without adverse effects for improved postural strength for reduced stress on spine and for improved performance of household chores.  Baseline:  Goal status: INITIAL  4.  Pt will report having H/A's no > than 2 days/week. Baseline:  Goal status: INITIAL   PLAN: PT FREQUENCY: 2x/week  PT DURATION: other: 10 weeks ; Pt will not be seen for weeks due to cataract surgery  PLANNED INTERVENTIONS: Therapeutic exercises, Therapeutic activity, Neuromuscular re-education, Patient/Family education, Self Care, Joint mobilization, Aquatic Therapy, Dry Needling, Electrical stimulation, Cryotherapy, Moist heat, Taping, Ultrasound, Manual therapy, and Re-evaluation  PLAN FOR NEXT SESSION:  STW to bilat UT, cervical, and medial scap mm.  Postural strengthening and stabilization.    Audie Clear III PT, DPT 05/20/22 7:21 AM

## 2022-05-20 ENCOUNTER — Encounter (HOSPITAL_BASED_OUTPATIENT_CLINIC_OR_DEPARTMENT_OTHER): Payer: Self-pay | Admitting: Physical Therapy

## 2022-05-22 ENCOUNTER — Ambulatory Visit (INDEPENDENT_AMBULATORY_CARE_PROVIDER_SITE_OTHER): Payer: Medicare Other | Admitting: Nurse Practitioner

## 2022-05-22 ENCOUNTER — Encounter (HOSPITAL_BASED_OUTPATIENT_CLINIC_OR_DEPARTMENT_OTHER): Payer: Self-pay | Admitting: Nurse Practitioner

## 2022-05-22 ENCOUNTER — Ambulatory Visit (HOSPITAL_BASED_OUTPATIENT_CLINIC_OR_DEPARTMENT_OTHER): Payer: Medicare Other | Admitting: Physical Therapy

## 2022-05-22 ENCOUNTER — Encounter (HOSPITAL_BASED_OUTPATIENT_CLINIC_OR_DEPARTMENT_OTHER): Payer: Self-pay | Admitting: Physical Therapy

## 2022-05-22 VITALS — BP 124/72 | HR 93 | Ht 61.0 in | Wt 173.0 lb

## 2022-05-22 DIAGNOSIS — E782 Mixed hyperlipidemia: Secondary | ICD-10-CM | POA: Diagnosis not present

## 2022-05-22 DIAGNOSIS — M62838 Other muscle spasm: Secondary | ICD-10-CM

## 2022-05-22 DIAGNOSIS — M546 Pain in thoracic spine: Secondary | ICD-10-CM

## 2022-05-22 DIAGNOSIS — I10 Essential (primary) hypertension: Secondary | ICD-10-CM

## 2022-05-22 DIAGNOSIS — E669 Obesity, unspecified: Secondary | ICD-10-CM

## 2022-05-22 DIAGNOSIS — E559 Vitamin D deficiency, unspecified: Secondary | ICD-10-CM | POA: Diagnosis not present

## 2022-05-22 DIAGNOSIS — E118 Type 2 diabetes mellitus with unspecified complications: Secondary | ICD-10-CM | POA: Diagnosis not present

## 2022-05-22 DIAGNOSIS — R293 Abnormal posture: Secondary | ICD-10-CM

## 2022-05-22 DIAGNOSIS — M542 Cervicalgia: Secondary | ICD-10-CM

## 2022-05-22 DIAGNOSIS — E2839 Other primary ovarian failure: Secondary | ICD-10-CM

## 2022-05-22 DIAGNOSIS — Z13820 Encounter for screening for osteoporosis: Secondary | ICD-10-CM

## 2022-05-22 DIAGNOSIS — K219 Gastro-esophageal reflux disease without esophagitis: Secondary | ICD-10-CM

## 2022-05-22 DIAGNOSIS — D691 Qualitative platelet defects: Secondary | ICD-10-CM

## 2022-05-22 DIAGNOSIS — Z1231 Encounter for screening mammogram for malignant neoplasm of breast: Secondary | ICD-10-CM

## 2022-05-22 LAB — COMPREHENSIVE METABOLIC PANEL
ALT: 105 IU/L — ABNORMAL HIGH (ref 0–32)
AST: 58 IU/L — ABNORMAL HIGH (ref 0–40)
Albumin/Globulin Ratio: 2 (ref 1.2–2.2)
Albumin: 4.5 g/dL (ref 3.9–4.9)
Alkaline Phosphatase: 121 IU/L (ref 44–121)
BUN/Creatinine Ratio: 26 (ref 12–28)
BUN: 16 mg/dL (ref 8–27)
Bilirubin Total: 0.2 mg/dL (ref 0.0–1.2)
CO2: 22 mmol/L (ref 20–29)
Calcium: 10.1 mg/dL (ref 8.7–10.3)
Chloride: 99 mmol/L (ref 96–106)
Creatinine, Ser: 0.62 mg/dL (ref 0.57–1.00)
Globulin, Total: 2.3 g/dL (ref 1.5–4.5)
Glucose: 291 mg/dL — ABNORMAL HIGH (ref 70–99)
Potassium: 4.8 mmol/L (ref 3.5–5.2)
Sodium: 137 mmol/L (ref 134–144)
Total Protein: 6.8 g/dL (ref 6.0–8.5)
eGFR: 98 mL/min/{1.73_m2} (ref >59)

## 2022-05-22 LAB — CBC WITH DIFFERENTIAL/PLATELET
Basophils Absolute: 0.1 10*3/uL (ref 0.0–0.2)
Basos: 1 %
EOS (ABSOLUTE): 0.4 10*3/uL (ref 0.0–0.4)
Eos: 4 %
Hematocrit: 44.9 % (ref 34.0–46.6)
Hemoglobin: 14.6 g/dL (ref 11.1–15.9)
Immature Grans (Abs): 0 10*3/uL (ref 0.0–0.1)
Immature Granulocytes: 0 %
Lymphocytes Absolute: 2.6 10*3/uL (ref 0.7–3.1)
Lymphs: 26 %
MCH: 31.3 pg (ref 26.6–33.0)
MCHC: 32.5 g/dL (ref 31.5–35.7)
MCV: 96 fL (ref 79–97)
Monocytes Absolute: 0.4 10*3/uL (ref 0.1–0.9)
Monocytes: 4 %
Neutrophils Absolute: 6.2 10*3/uL (ref 1.4–7.0)
Neutrophils: 65 %
Platelets: 500 10*3/uL — ABNORMAL HIGH (ref 150–450)
RBC: 4.67 x10E6/uL (ref 3.77–5.28)
RDW: 12.2 % (ref 11.7–15.4)
WBC: 9.7 10*3/uL (ref 3.4–10.8)

## 2022-05-22 LAB — HEMOGLOBIN A1C
Est. average glucose Bld gHb Est-mCnc: 194 mg/dL
Hgb A1c MFr Bld: 8.4 % — ABNORMAL HIGH (ref 4.8–5.6)

## 2022-05-22 MED ORDER — VITAMIN D 125 MCG (5000 UT) PO CAPS: 5000.0000 [IU] | ORAL_CAPSULE | Freq: Every day | ORAL | 3 refills | Status: DC

## 2022-05-22 MED ORDER — LISINOPRIL 10 MG PO TABS: 10.0000 mg | ORAL_TABLET | Freq: Every day | ORAL | 3 refills | Status: DC

## 2022-05-22 MED ORDER — METFORMIN HCL 1000 MG PO TABS: 1000.0000 mg | ORAL_TABLET | Freq: Two times a day (BID) | ORAL | 3 refills | Status: DC

## 2022-05-22 MED ORDER — FISH OIL 1000 MG PO CAPS
1000.0000 mg | ORAL_CAPSULE | Freq: Every day | ORAL | Status: DC
Start: 2022-05-22 — End: 2023-04-26

## 2022-05-22 MED ORDER — OMEPRAZOLE 40 MG PO CPDR: 40.0000 mg | DELAYED_RELEASE_CAPSULE | Freq: Once | ORAL | 3 refills | Status: DC

## 2022-05-22 MED ORDER — OZEMPIC (0.25 OR 0.5 MG/DOSE) 2 MG/3ML ~~LOC~~ SOPN: 0.5000 mg | PEN_INJECTOR | SUBCUTANEOUS | 2 refills | Status: DC

## 2022-05-22 MED ORDER — SIMVASTATIN 10 MG PO TABS: 10.0000 mg | ORAL_TABLET | Freq: Every day | ORAL | 3 refills | Status: DC

## 2022-05-22 NOTE — Patient Instructions (Addendum)
  With your new Ozempic pen I want you to increase to the 0.'5mg'$  for every week.    Dear Jeanne Shaw,  I want to thank you for trusting me with your primary care services for nearly the last two years at [facility]. It is been an Survey[employer] minerals to serve so many wonderful people in the community in a new and innovative facility in [place]. I was recently presented with a new opportunity that will allow me to continue to serve as a primary care provider within [facility] at a different location. After much contemplation, I have made the decision to make this transition. I never dreamed of leaving [facility] and will leave a piece of my heart in this amazing facility. The new office provides an opportunity to continue to provide the level of care my patients have always experienced with increased support staff and improved work-life balance for myself.    Dr. Burnard Bunting will be taking over patients who would like to stay at [facility] and he will be happy to continue your care. I know many of you utilize numerous services in the building and the convenience of the location has been a large draw to the facility, therefore staying in one location offers a great benefit to you. If you plan to stay here, the office staff will be happy to help you with this transition when you schedule a follow-up or your next appointment. They are actively searching for a replacement for me and will hopefully have a new provider in place by the first of the year.    Of course, I will be thrilled to continue to provide care for any patients wishing to follow me. I will be accepting current and new patients at the new location. Starting June 22, 2022 I will be seeing patients at:  [facility] located at [address] [place], [place] [address].  [phone]  This practice is very near [facility] and conveniently located less than 15 minutes from my current location and less than 10 minutes off of the [address] from  several exits including; [place], [place], [place].  My new practice is already working to begin scheduling for me with follow-ups, annual visits, new patient appointments, and routine care. If you would like to be placed on the schedule at the new location, please feel free to call [phone] and they will be happy to get you on the schedule at your convenience.   I look forward to continue to serve my community and hopefully see many familiar faces I love. If you are unable to transition, I completely understand. Thank you all for your understanding, transition is never easy, but I am hopeful you will find the transition to be positive for your care.   Sincerely,  [name], DNP, AGNP-c

## 2022-05-22 NOTE — Progress Notes (Addendum)
[name], DNP, AGNP-c [place] [place],  [address] [phone] Office [phone] Fax  ESTABLISHED PATIENT- Chronic Health and/or Follow-Up Visit  Blood pressure 124/72, pulse 93, height '5\' 1"'$  (1.549 m), weight 173 lb (78.5 kg), SpO2 100 %.    Jeanne Shaw is a 66 y.o. year old female presenting today for evaluation and management of the following: Follow-up (Patient presents today for follow up DM, HTN and HLD)  Allergies stable. Medication is working well for her.  She is doing therapy for tension in her neck, back, and shoulders and that is going well.   HTN/HLD Taking Lisinopril and simvastatin She is only checking her BP on occasion- she can usually tell when this is off No HA, CP, Vision changes, palpitations, Shob, no LE edema  DM Taking metformin and Ozempic Checking her BG on occasion- when she does test it seems to be in 160's She endorses increased hunger than previously noticed. She has no increased thirst.  She has had increased urination, but she feels this is related to her bladder dropping and she has a check for this in November. She did get down to 163lbs, but she has started gaining this back again  She is increasing her vegetables and decreasing the starches in her diet    All ROS negative with exception of what is listed above.   PHYSICAL EXAM Physical Exam Vitals and nursing note reviewed.  Constitutional:      General: She is not in acute distress.    Appearance: Normal appearance.  HENT:     Head: Normocephalic.  Eyes:     Extraocular Movements: Extraocular movements intact.     Conjunctiva/sclera: Conjunctivae normal.     Pupils: Pupils are equal, round, and reactive to light.  Neck:     Vascular: No carotid bruit.  Cardiovascular:     Rate and Rhythm: Normal rate and regular rhythm.     Pulses: Normal pulses.     Heart sounds: Normal heart sounds.  No murmur heard. Pulmonary:     Effort: Pulmonary effort is normal.     Breath sounds: Normal breath sounds. No wheezing.  Abdominal:     General: Bowel sounds are normal. There is no distension.     Palpations: Abdomen is soft.     Tenderness: There is no abdominal tenderness. There is no guarding.  Musculoskeletal:        General: Normal range of motion.     Cervical back: Normal range of motion and neck supple.     Right lower leg: No edema.     Left lower leg: No edema.  Lymphadenopathy:     Cervical: No cervical adenopathy.  Skin:    General: Skin is warm and dry.     Capillary Refill: Capillary refill takes less than 2 seconds.  Neurological:     General: No focal deficit present.     Mental Status: She is alert and oriented to person, place, and time.  Psychiatric:        Mood and Affect: Mood normal.        Behavior: Behavior normal.        Thought Content: Thought content normal.        Judgment: Judgment normal.     PLAN Problem List Items Addressed This Visit     DM (diabetes mellitus), type 2 with complications ([place]) - Primary   Relevant Medications   lisinopril (ZESTRIL) 10 MG  tablet   metFORMIN (GLUCOPHAGE) 1000 MG tablet   Semaglutide,0.25 or 0.'5MG'$ /DOS, (OZEMPIC, 0.25 OR 0.5 MG/DOSE,) 2 MG/3ML SOPN   simvastatin (ZOCOR) 10 MG tablet   Other Relevant Orders   Hemoglobin A1c   Urine Microalbumin w/creat. ratio   CBC with Differential/Platelet   Comprehensive metabolic panel   Hyperlipidemia   Relevant Medications   lisinopril (ZESTRIL) 10 MG tablet   metFORMIN (GLUCOPHAGE) 1000 MG tablet   simvastatin (ZOCOR) 10 MG tablet   Other Relevant Orders   Hemoglobin A1c   Urine Microalbumin w/creat. ratio   CBC with Differential/Platelet   Comprehensive metabolic panel   Essential hypertension   Relevant Medications   lisinopril (ZESTRIL) 10 MG tablet   metFORMIN (GLUCOPHAGE) 1000 MG tablet   simvastatin (ZOCOR) 10 MG tablet   Other Relevant Orders    Hemoglobin A1c   Urine Microalbumin w/creat. ratio   CBC with Differential/Platelet   Comprehensive metabolic panel   Obesity (BMI 30.0-34.9)   Relevant Medications   lisinopril (ZESTRIL) 10 MG tablet   metFORMIN (GLUCOPHAGE) 1000 MG tablet   simvastatin (ZOCOR) 10 MG tablet   Gastroesophageal reflux disease   Relevant Medications   omeprazole (PRILOSEC) 40 MG capsule   Other Visit Diagnoses     Vitamin D deficiency       Relevant Medications   Cholecalciferol (VITAMIN D) 125 MCG (5000 UT) CAPS   Other Relevant Orders   CBC with Differential/Platelet   Comprehensive metabolic panel   Screening mammogram for breast cancer       Relevant Orders   MM 3D SCREEN BREAST BILATERAL   Screening for osteoporosis       Relevant Orders   DG Bone Density   Other primary ovarian failure       Relevant Orders   DG Bone Density   Abnormal platelets ([place])       Relevant Orders   CBC with Differential/Platelet     Continue management of chronic conditions as ordered. No changes in medications at this time. Will repeat labs today for evaluation. Diet and exercise recommendations reinforced. Recommend continue monitor BG levels at least once a day.   Return in about 3 months (around 08/22/2022) for DM, HTN, HLD.   [name], DNP, AGNP-c 05/22/2022  8:54 AM

## 2022-05-22 NOTE — Therapy (Signed)
OUTPATIENT PHYSICAL THERAPY TREATMENT NOTE   Patient Name: Jeanne Shaw MRN: [id number] DOB:14-Nov-1955, 66 y.o., female Today's Date: 05/22/2022      PT End of Session - 05/22/22 0853     Visit Number 6    Number of Visits 12    Date for PT Re-Evaluation 06/08/22    Authorization Type MCR part A and B    PT Start Time 0803    PT Stop Time 0844    PT Time Calculation (min) 41 min    Activity Tolerance Patient tolerated treatment well    Behavior During Therapy [facility] for tasks assessed/performed                 Past Medical History:  Diagnosis Date   Allergy    Anxiety    Asthma    Cataract    OU   Diabetes mellitus without complication ([place])    GERD (gastroesophageal reflux disease)    Hypertension    Macular degeneration    Dry OU   Subacute vaginitis 04/25/2021   Past Surgical History:  Procedure Laterality Date   BIOPSY  03/26/2020   Procedure: BIOPSY;  Surgeon: Harvel Quale, MD;  [facility];  Service: Gastroenterology;;   COLONOSCOPY WITH PROPOFOL N/A 03/26/2020   Procedure: COLONOSCOPY WITH PROPOFOL;  Surgeon: Harvel Quale, MD;  [facility];  Service: Gastroenterology;  Laterality: N/A;  815   CYST EXCISION     off vocal cord    POLYPECTOMY  03/26/2020   Procedure: POLYPECTOMY;  Surgeon: Harvel Quale, MD;  [facility];  Service: Gastroenterology;;   Patient Active Problem List   Diagnosis Date Noted   Fallen bladder 04/08/2022   Neck and shoulder pain 04/08/2022   Cervicogenic headache 04/08/2022   Elevated LFTs 11/24/2021   Gastroesophageal reflux disease 11/24/2021   Acute swimmer's ear of right side 11/24/2021   Allergies 11/24/2021   Cataract of both eyes 08/25/2021   Encounter for medical examination to establish care 04/25/2021   Leg pain, posterior, left 04/25/2021   Obesity (BMI 30.0-34.9) 09/02/2018   Hyperlipidemia 02/02/2018   DM (diabetes  mellitus), type 2 with complications ([place]) 15/83/0940   Essential hypertension 08/02/2017   Rosacea 08/02/2017     REFERRING PROVIDER: [name], NP   REFERRING DIAG: M54.2,M25.519 (ICD-10-CM) - Neck and shoulder pain G44.86 (ICD-10-CM) - Cervicogenic headache   THERAPY DIAG:  Cervicalgia  Pain in thoracic spine  Other muscle spasm  Abnormal posture  Rationale for Evaluation and Treatment Rehabilitation  ONSET DATE: MD order 02/19/2022  SUBJECTIVE:  SUBJECTIVE STATEMENT: Pt denies having a H/A currently and states her H/A's have not been as bad.  Pt reports reduced tension in upper back though still has tension in bilat UT.  Pt denies any adverse effects after prior Rx.  Pt reports compliance with HEP.  Pt states she feels much different than when she started PT.  Pt reports improved flexibility and felt like she could touch her head to her shoulder yesterday.     PERTINENT HISTORY:  Chronic pain DM Type 2, Macular degeneration, vertigo, occasional LBP  PAIN:  Are you having pain? Yes: NPRS scale: Current:  2/10 ; Worst:  6/10 ; Best: 1 /10 Pain location:  bilat UT Pain description: dull, tension, pressure Aggravating factors: Pt reports no specific factors.  Relieving factors: massages  PRECAUTIONS: Other: chronic pain  WEIGHT BEARING RESTRICTIONS No  FALLS:  Has patient fallen in last 6 months? No    OCCUPATION: Pt is retired  PLOF: Independent  PATIENT GOALS decrease pain level consistently to 0-1/10  OBJECTIVE:   DIAGNOSTIC FINDINGS:  CT in March 2023 for post neck mass:   IMPRESSION: Palpable mass likely reflects a lipoma   TODAY'S TREATMENT:    Manual Therapy: Reviewed pt presentation, response to prior Rx, HEP compliance, and pain level.  Pt  received STM to R UT, medial scap mm, mid trap with trigger point release to UT and IASTM to bilat UT, mid trap, and medial scap mm in sitting and also STM to cervical paraspinals and suboccipitals in supine to improve soft tissue tightness and pain and reduce myofascial restrictions and adhesions.   PT instructed and demonstrated using a tennis ball on wall to improve rhomboids and medial scap mm soft tissue mobility.  Pt performed in clinic with PT instruction.      PATIENT EDUCATION:  Education details: POC, exercise rationale, posture, exercise form, and HEP.  Using a tennis ball at home for soft tissue mobility. Person educated: Patient Education method: Explanation, Demonstration, and Verbal cues Education comprehension: verbalized understanding, returned demonstration, verbal cues required, and needs further education   HOME EXERCISE PROGRAM: Access Code: [email or web address] URL: [email or web address] Date: 04/30/2022 Prepared by: Ronny Flurry   ASSESSMENT:  CLINICAL IMPRESSION: Pt is making good progress with pain, tightness, and sx's as evidenced by her subjective reports.  Pt reports improved tension and reduced H/A's.   Pt is improving with tightness and tension through medial scap mm and UT bilat.  She had an appropriate response to IASTM.  PT educated pt in using a tennis ball on wall to improve soft tissue mobility of medial scap mm/rhomboids.  Pt responded well to Rx having no c/o's or increased pain after Rx.  Pt should benefit from skilled PT services to address goals and improve function.   OBJECTIVE IMPAIRMENTS decreased activity tolerance, decreased ROM, hypomobility, increased muscle spasms, impaired flexibility, postural dysfunction, and pain.   ACTIVITY LIMITATIONS lifting  PARTICIPATION LIMITATIONS:   PERSONAL FACTORS Chronic and 1-2 comorbidities: DM Type 2 and occasional LBP  are also affecting patient's functional outcome.   REHAB POTENTIAL:  Good  CLINICAL DECISION MAKING: Evolving/moderate complexity  EVALUATION COMPLEXITY: Low   GOALS:  SHORT TERM GOALS: Target date: 05/11/2022  Pt will be independent and compliant with HEP for improved pain, ROM, function, and posture.  Baseline:  Goal status: GOAL MET  2.  Pt will demo at least an 8 deg increase in R Sb'ing for improved ROM, stiffness, and flexibility.  Baseline: R SB:  32 deg  Goal status: INITIAL  3.  Pt will report at least a 25% improvement in pain and sx's overall.  Baseline:  Goal status:  GOAL MET   LONG TERM GOALS: Target date: 06/08/2022  Pt will report at least a 70% improvement in pain and sx's overall.  Baseline:  Goal status: INITIAL  2.  Pt will demo improved posture with reduced forward head and rounded shoulders for improved postural alignment and reduced stress on her cervical and thoracic spine for improved pain with daily activities.  Baseline:  Goal status: INITIAL  3.  Pt will perform postural stabilization exercises without adverse effects for improved postural strength for reduced stress on spine and for improved performance of household chores.  Baseline:  Goal status: INITIAL  4.  Pt will report having H/A's no > than 2 days/week. Baseline:  Goal status: INITIAL   PLAN: PT FREQUENCY: 2x/week  PT DURATION: other: 10 weeks ; Pt will not be seen for weeks due to cataract surgery  PLANNED INTERVENTIONS: Therapeutic exercises, Therapeutic activity, Neuromuscular re-education, Patient/Family education, Self Care, Joint mobilization, Aquatic Therapy, Dry Needling, Electrical stimulation, Cryotherapy, Moist heat, Taping, Ultrasound, Manual therapy, and Re-evaluation  PLAN FOR NEXT SESSION:  STW to bilat UT, cervical, and medial scap mm.  Postural strengthening and stabilization.    Selinda Michaels III PT, DPT 05/22/22 9:20 AM

## 2022-05-25 ENCOUNTER — Encounter (HOSPITAL_BASED_OUTPATIENT_CLINIC_OR_DEPARTMENT_OTHER): Payer: Self-pay | Admitting: Physical Therapy

## 2022-05-25 ENCOUNTER — Ambulatory Visit (HOSPITAL_BASED_OUTPATIENT_CLINIC_OR_DEPARTMENT_OTHER): Payer: Medicare Other | Admitting: Physical Therapy

## 2022-05-25 DIAGNOSIS — M542 Cervicalgia: Secondary | ICD-10-CM

## 2022-05-25 DIAGNOSIS — R293 Abnormal posture: Secondary | ICD-10-CM

## 2022-05-25 DIAGNOSIS — M62838 Other muscle spasm: Secondary | ICD-10-CM

## 2022-05-25 DIAGNOSIS — M546 Pain in thoracic spine: Secondary | ICD-10-CM

## 2022-05-25 NOTE — Therapy (Signed)
OUTPATIENT PHYSICAL THERAPY TREATMENT NOTE   Patient Name: Jeanne Shaw MRN: [id number] DOB:1955-08-27, 66 y.o., female Today's Date: 05/25/2022      PT End of Session - 05/25/22 1013     Visit Number 7    Number of Visits 12    Date for PT Re-Evaluation 06/08/22    Authorization Type MCR part A and B    PT Start Time 0933    PT Stop Time 1016    PT Time Calculation (min) 43 min    Activity Tolerance Patient tolerated treatment well    Behavior During Therapy WFL for tasks assessed/performed                  Past Medical History:  Diagnosis Date   Allergy    Anxiety    Asthma    Cataract    OU   Diabetes mellitus without complication ([place])    GERD (gastroesophageal reflux disease)    Hypertension    Macular degeneration    Dry OU   Subacute vaginitis 04/25/2021   Past Surgical History:  Procedure Laterality Date   BIOPSY  03/26/2020   Procedure: BIOPSY;  Surgeon: Harvel Quale, MD;  [facility];  Service: Gastroenterology;;   COLONOSCOPY WITH PROPOFOL N/A 03/26/2020   Procedure: COLONOSCOPY WITH PROPOFOL;  Surgeon: Harvel Quale, MD;  [facility];  Service: Gastroenterology;  Laterality: N/A;  815   CYST EXCISION     off vocal cord    POLYPECTOMY  03/26/2020   Procedure: POLYPECTOMY;  Surgeon: Harvel Quale, MD;  [facility];  Service: Gastroenterology;;   Patient Active Problem List   Diagnosis Date Noted   Fallen bladder 04/08/2022   Neck and shoulder pain 04/08/2022   Cervicogenic headache 04/08/2022   Elevated LFTs 11/24/2021   Gastroesophageal reflux disease 11/24/2021   Acute swimmer's ear of right side 11/24/2021   Allergies 11/24/2021   Cataract of both eyes 08/25/2021   Encounter for medical examination to establish care 04/25/2021   Leg pain, posterior, left 04/25/2021   Obesity (BMI 30.0-34.9) 09/02/2018   Hyperlipidemia 02/02/2018   DM (diabetes  mellitus), type 2 with complications ([place]) 79/09/4095   Essential hypertension 08/02/2017   Rosacea 08/02/2017     REFERRING PROVIDER: [name], NP   REFERRING DIAG: M54.2,M25.519 (ICD-10-CM) - Neck and shoulder pain G44.86 (ICD-10-CM) - Cervicogenic headache   THERAPY DIAG:  Cervicalgia  Pain in thoracic spine  Other muscle spasm  Abnormal posture  Rationale for Evaluation and Treatment Rehabilitation  ONSET DATE: MD order 02/19/2022  SUBJECTIVE:  SUBJECTIVE STATEMENT: Pt states she has a slight H/A, but not a bad one.  Pt states she had increased tension and a H/A last night.  She performed her exercises and felt better.  Pt used the tennis ball for STW.  Pt reports her H/A's have not been as bad since she started PT.  Pt reports the tension in her upper back has moved to bilat UT.  Pt denies any adverse effects after prior Rx.  Pt reports compliance with HEP.  Pt states she feels much different than when she started PT.     PERTINENT HISTORY:  Chronic pain DM Type 2, Macular degeneration, vertigo, occasional LBP  PAIN:  Are you having pain? Yes: NPRS scale: Current:  2-3/10 ; Worst:  6/10 ; Best: 1 /10 Pain location:  bilat UT Pain description: dull, tension, pressure Aggravating factors: Pt reports no specific factors.  Relieving factors: massages  PRECAUTIONS: Other: chronic pain  WEIGHT BEARING RESTRICTIONS No  FALLS:  Has patient fallen in last 6 months? No    OCCUPATION: Pt is retired  PLOF: Independent  PATIENT GOALS decrease pain level consistently to 0-1/10  OBJECTIVE:   DIAGNOSTIC FINDINGS:  CT in March 2023 for post neck mass:   IMPRESSION: Palpable mass likely reflects a lipoma   TODAY'S TREATMENT:    Manual Therapy: Reviewed pt  presentation, response to prior Rx, HEP compliance, and pain level.  Pt received STM to bilat UT, medial scap mm, mid trap with trigger point release to UT and IASTM to bilat UT, mid trap, and medial scap mm in sitting and also STM to cervical paraspinals and suboccipitals in supine to improve soft tissue tightness and pain and reduce myofascial restrictions and adhesions.    Therapeutic Exercise:   UBE x 56mns Seated UT stretch 2x20-30 sec Standing rows with GTB with scap retraction 2x10 Standing shoulder extension with GTB with scap retraction 2x10 Shoulder ER bilat with YTB 2x10 with scap retraction    PATIENT EDUCATION:  Education details: POC, exercise rationale, posture, exercise form, and HEP.  Using a tennis ball at home for soft tissue mobility.  Pt given a GTB for home exercise.  Spoke to pt about dry needling.  Person educated: Patient Education method: Explanation, Demonstration, and Verbal cues Education comprehension: verbalized understanding, returned demonstration, verbal cues required, and needs further education   HOME EXERCISE PROGRAM: Access Code: [email or web address] URL: [email or web address] Date: 04/30/2022 Prepared by: TRonny Flurry  ASSESSMENT:  CLINICAL IMPRESSION: Pt is making good progress with pain, tightness, and sx's.  She continues to have tension though her tightness and tension are improving.  She has reduced H/A's.  Pt had an appropriate response to IASTM and had decreased petichiae compared to prior treatments in bilat UT and superomedial scap mm.  Pt performed exercises well and is compliant with HEP.  Pt responded well to Rx reporting minimally improved pain from 2.5-3/10 before Rx to 1.5-2/10 after Rx.  Pt should continue to benefit from continued skilled PT services to address goals and improve function.    OBJECTIVE IMPAIRMENTS decreased activity tolerance, decreased ROM, hypomobility, increased muscle spasms, impaired flexibility,  postural dysfunction, and pain.   ACTIVITY LIMITATIONS lifting  PARTICIPATION LIMITATIONS:   PERSONAL FACTORS Chronic and 1-2 comorbidities: DM Type 2 and occasional LBP  are also affecting patient's functional outcome.   REHAB POTENTIAL: Good  CLINICAL DECISION MAKING: Evolving/moderate complexity  EVALUATION COMPLEXITY: Low   GOALS:  SHORT TERM GOALS: Target date: 05/11/2022  Pt will be  independent and compliant with HEP for improved pain, ROM, function, and posture.  Baseline:  Goal status: GOAL MET  2.  Pt will demo at least an 8 deg increase in R Sb'ing for improved ROM, stiffness, and flexibility.  Baseline: R SB:  32 deg Goal status: INITIAL  3.  Pt will report at least a 25% improvement in pain and sx's overall.  Baseline:  Goal status:  GOAL MET   LONG TERM GOALS: Target date: 06/08/2022  Pt will report at least a 70% improvement in pain and sx's overall.  Baseline:  Goal status: INITIAL  2.  Pt will demo improved posture with reduced forward head and rounded shoulders for improved postural alignment and reduced stress on her cervical and thoracic spine for improved pain with daily activities.  Baseline:  Goal status: INITIAL  3.  Pt will perform postural stabilization exercises without adverse effects for improved postural strength for reduced stress on spine and for improved performance of household chores.  Baseline:  Goal status: INITIAL  4.  Pt will report having H/A's no > than 2 days/week. Baseline:  Goal status: INITIAL   PLAN: PT FREQUENCY: 2x/week  PT DURATION: other: 10 weeks  PLANNED INTERVENTIONS: Therapeutic exercises, Therapeutic activity, Neuromuscular re-education, Patient/Family education, Self Care, Joint mobilization, Aquatic Therapy, Dry Needling, Electrical stimulation, Cryotherapy, Moist heat, Taping, Ultrasound, Manual therapy, and Re-evaluation  PLAN FOR NEXT SESSION:  STW to bilat UT, cervical, and medial scap mm.  Postural  strengthening and stabilization.  Pt may attempt dry needling soon.   Selinda Michaels III PT, DPT 05/25/22 11:41 AM

## 2022-05-26 ENCOUNTER — Encounter (HOSPITAL_BASED_OUTPATIENT_CLINIC_OR_DEPARTMENT_OTHER): Payer: Self-pay | Admitting: Nurse Practitioner

## 2022-05-29 ENCOUNTER — Encounter (HOSPITAL_BASED_OUTPATIENT_CLINIC_OR_DEPARTMENT_OTHER): Payer: Self-pay | Admitting: Physical Therapy

## 2022-05-29 ENCOUNTER — Ambulatory Visit (HOSPITAL_BASED_OUTPATIENT_CLINIC_OR_DEPARTMENT_OTHER): Payer: Medicare Other | Admitting: Physical Therapy

## 2022-05-29 DIAGNOSIS — M542 Cervicalgia: Secondary | ICD-10-CM | POA: Diagnosis not present

## 2022-05-29 DIAGNOSIS — R293 Abnormal posture: Secondary | ICD-10-CM

## 2022-05-29 DIAGNOSIS — M62838 Other muscle spasm: Secondary | ICD-10-CM

## 2022-05-29 DIAGNOSIS — M546 Pain in thoracic spine: Secondary | ICD-10-CM

## 2022-05-29 NOTE — Therapy (Signed)
OUTPATIENT PHYSICAL THERAPY TREATMENT NOTE   Patient Name: Jeanne Shaw MRN: [id number] DOB:1955-09-17, 66 y.o., female Today's Date: 05/29/2022      PT End of Session - 05/29/22 0932     Visit Number 8    Number of Visits 12    Date for PT Re-Evaluation 06/08/22    Authorization Type MCR part A and B    PT Start Time 0930    PT Stop Time 1012    PT Time Calculation (min) 42 min    Activity Tolerance Patient tolerated treatment well    Behavior During Therapy WFL for tasks assessed/performed                  Past Medical History:  Diagnosis Date   Allergy    Anxiety    Asthma    Cataract    OU   Diabetes mellitus without complication ([place])    GERD (gastroesophageal reflux disease)    Hypertension    Macular degeneration    Dry OU   Subacute vaginitis 04/25/2021   Past Surgical History:  Procedure Laterality Date   BIOPSY  03/26/2020   Procedure: BIOPSY;  Surgeon: Harvel Quale, MD;  [facility];  Service: Gastroenterology;;   COLONOSCOPY WITH PROPOFOL N/A 03/26/2020   Procedure: COLONOSCOPY WITH PROPOFOL;  Surgeon: Harvel Quale, MD;  [facility];  Service: Gastroenterology;  Laterality: N/A;  815   CYST EXCISION     off vocal cord    POLYPECTOMY  03/26/2020   Procedure: POLYPECTOMY;  Surgeon: Harvel Quale, MD;  [facility];  Service: Gastroenterology;;   Patient Active Problem List   Diagnosis Date Noted   Fallen bladder 04/08/2022   Neck and shoulder pain 04/08/2022   Cervicogenic headache 04/08/2022   Elevated LFTs 11/24/2021   Gastroesophageal reflux disease 11/24/2021   Acute swimmer's ear of right side 11/24/2021   Allergies 11/24/2021   Cataract of both eyes 08/25/2021   Encounter for medical examination to establish care 04/25/2021   Leg pain, posterior, left 04/25/2021   Obesity (BMI 30.0-34.9) 09/02/2018   Hyperlipidemia 02/02/2018   DM (diabetes  mellitus), type 2 with complications ([place]) 00/17/4944   Essential hypertension 08/02/2017   Rosacea 08/02/2017     REFERRING PROVIDER: [name], NP   REFERRING DIAG: M54.2,M25.519 (ICD-10-CM) - Neck and shoulder pain G44.86 (ICD-10-CM) - Cervicogenic headache   THERAPY DIAG:  Cervicalgia  Pain in thoracic spine  Other muscle spasm  Abnormal posture  Rationale for Evaluation and Treatment Rehabilitation  ONSET DATE: MD order 02/19/2022  SUBJECTIVE:  SUBJECTIVE STATEMENT: Pt states her pain has bothered her this week.  She had no adverse effects after prior Rx.  She reported having increased pain the following night and has been bothering her this week.  Pt reports having a 4/10 pain the night before.  Pt also had a H/A though denies having one currently.  Pt reports compliance with HEP.  Pt used the tennis ball for STW.  Pt reports her legs are sore from doing work on the ladder this week.     PERTINENT HISTORY:  Chronic pain DM Type 2, Macular degeneration, vertigo, occasional LBP  PAIN:  Are you having pain? Yes: NPRS scale: Current:  1/10 ; Worst:  6/10 ; Best: 1 /10 Pain location:  bilat UT Pain description: dull, tension, pressure Aggravating factors: Pt reports no specific factors.  Relieving factors: massages  PRECAUTIONS: Other: chronic pain  WEIGHT BEARING RESTRICTIONS No  FALLS:  Has patient fallen in last 6 months? No    OCCUPATION: Pt is retired  PLOF: Independent  PATIENT GOALS decrease pain level consistently to 0-1/10  OBJECTIVE:   DIAGNOSTIC FINDINGS:  CT in March 2023 for post neck mass:   IMPRESSION: Palpable mass likely reflects a lipoma   TODAY'S TREATMENT:    Manual Therapy: Reviewed pt presentation, response to prior Rx, HEP  compliance, and pain level.  Pt received STM to bilat UT, cervical paraspinals, and suboccipitals in supine and STM to rhomboids, mid trap, medial scap mm in prone to improve soft tissue tightness and pain and reduce myofascial restrictions and adhesions.    Therapeutic Exercise:     Pt received supine manual UT stretch 3x30 sec Standing rows with GTB with scap retraction 2x10 Standing shoulder extension with GTB with scap retraction 2x10 Shoulder ER bilat with YTB 2x10 with scap retraction Standing hz abd with YTB with scap retraction    PATIENT EDUCATION:  Education details: POC, exercise rationale, posture, exercise form, and HEP.  Using a tennis ball at home for soft tissue mobility.  Spoke to pt about dry needling.  Person educated: Patient Education method: Explanation, Demonstration, and Verbal cues Education comprehension: verbalized understanding, returned demonstration, verbal cues required, and needs further education   HOME EXERCISE PROGRAM: Access Code: [email or web address] URL: [email or web address] Date: 04/30/2022 Prepared by: Ronny Flurry   ASSESSMENT:  CLINICAL IMPRESSION: Pt is progressing well with pain, tightness, and sx's.  Pt reports having tightness and pain this week though overall is better.  She continues to have reduced H/A's.  She continues to have tightness in bilat UT, cervical paraspinals, and medial scap  mm though is improving.  Pt has tightness in medial scap mm bilat.  Pt performed exercises well  with cuing and instruction in correct form.  She responded well to Rx having no increased pain after Rx.  Pt should continue to benefit from continued skilled PT services to address goals and improve function.    OBJECTIVE IMPAIRMENTS decreased activity tolerance, decreased ROM, hypomobility, increased muscle spasms, impaired flexibility, postural dysfunction, and pain.   ACTIVITY LIMITATIONS lifting  PARTICIPATION LIMITATIONS:   PERSONAL  FACTORS Chronic and 1-2 comorbidities: DM Type 2 and occasional LBP  are also affecting patient's functional outcome.   REHAB POTENTIAL: Good  CLINICAL DECISION MAKING: Evolving/moderate complexity  EVALUATION COMPLEXITY: Low   GOALS:  SHORT TERM GOALS: Target date: 05/11/2022  Pt will be independent and compliant with HEP for improved pain, ROM, function, and posture.  Baseline:  Goal status: GOAL MET  2.  Pt  will demo at least an 8 deg increase in R Sb'ing for improved ROM, stiffness, and flexibility.  Baseline: R SB:  32 deg Goal status: INITIAL  3.  Pt will report at least a 25% improvement in pain and sx's overall.  Baseline:  Goal status:  GOAL MET   LONG TERM GOALS: Target date: 06/08/2022  Pt will report at least a 70% improvement in pain and sx's overall.  Baseline:  Goal status: INITIAL  2.  Pt will demo improved posture with reduced forward head and rounded shoulders for improved postural alignment and reduced stress on her cervical and thoracic spine for improved pain with daily activities.  Baseline:  Goal status: INITIAL  3.  Pt will perform postural stabilization exercises without adverse effects for improved postural strength for reduced stress on spine and for improved performance of household chores.  Baseline:  Goal status: INITIAL  4.  Pt will report having H/A's no > than 2 days/week. Baseline:  Goal status: INITIAL   PLAN: PT FREQUENCY: 2x/week  PT DURATION: other: 10 weeks  PLANNED INTERVENTIONS: Therapeutic exercises, Therapeutic activity, Neuromuscular re-education, Patient/Family education, Self Care, Joint mobilization, Aquatic Therapy, Dry Needling, Electrical stimulation, Cryotherapy, Moist heat, Taping, Ultrasound, Manual therapy, and Re-evaluation  PLAN FOR NEXT SESSION:  STW to bilat UT, cervical, and medial scap mm.  Postural strengthening and stabilization.  Pt may attempt dry needling soon.   Selinda Michaels III PT, DPT 05/29/22  10:47 AM

## 2022-06-01 ENCOUNTER — Ambulatory Visit (HOSPITAL_BASED_OUTPATIENT_CLINIC_OR_DEPARTMENT_OTHER): Payer: Medicare Other | Admitting: Physical Therapy

## 2022-06-01 ENCOUNTER — Encounter (HOSPITAL_BASED_OUTPATIENT_CLINIC_OR_DEPARTMENT_OTHER): Payer: Self-pay | Admitting: Physical Therapy

## 2022-06-01 DIAGNOSIS — M62838 Other muscle spasm: Secondary | ICD-10-CM

## 2022-06-01 DIAGNOSIS — R293 Abnormal posture: Secondary | ICD-10-CM

## 2022-06-01 DIAGNOSIS — M542 Cervicalgia: Secondary | ICD-10-CM

## 2022-06-01 DIAGNOSIS — M546 Pain in thoracic spine: Secondary | ICD-10-CM

## 2022-06-01 NOTE — Therapy (Signed)
OUTPATIENT PHYSICAL THERAPY TREATMENT NOTE   Patient Name: Jeanne Shaw MRN: [id number] DOB:25-Nov-1955, 66 y.o., female Today's Date: 06/01/2022             Past Medical History:  Diagnosis Date   Allergy    Anxiety    Asthma    Cataract    OU   Diabetes mellitus without complication ([place])    GERD (gastroesophageal reflux disease)    Hypertension    Macular degeneration    Dry OU   Subacute vaginitis 04/25/2021   Past Surgical History:  Procedure Laterality Date   BIOPSY  03/26/2020   Procedure: BIOPSY;  Surgeon: Harvel Quale, MD;  [facility];  Service: Gastroenterology;;   COLONOSCOPY WITH PROPOFOL N/A 03/26/2020   Procedure: COLONOSCOPY WITH PROPOFOL;  Surgeon: Harvel Quale, MD;  [facility];  Service: Gastroenterology;  Laterality: N/A;  815   CYST EXCISION     off vocal cord    POLYPECTOMY  03/26/2020   Procedure: POLYPECTOMY;  Surgeon: Harvel Quale, MD;  [facility];  Service: Gastroenterology;;   Patient Active Problem List   Diagnosis Date Noted   Fallen bladder 04/08/2022   Neck and shoulder pain 04/08/2022   Cervicogenic headache 04/08/2022   Elevated LFTs 11/24/2021   Gastroesophageal reflux disease 11/24/2021   Acute swimmer's ear of right side 11/24/2021   Allergies 11/24/2021   Cataract of both eyes 08/25/2021   Encounter for medical examination to establish care 04/25/2021   Leg pain, posterior, left 04/25/2021   Obesity (BMI 30.0-34.9) 09/02/2018   Hyperlipidemia 02/02/2018   DM (diabetes mellitus), type 2 with complications ([place]) 31/49/7026   Essential hypertension 08/02/2017   Rosacea 08/02/2017     REFERRING PROVIDER: [name], NP   REFERRING DIAG: M54.2,M25.519 (ICD-10-CM) - Neck and shoulder pain G44.86 (ICD-10-CM) - Cervicogenic headache   THERAPY DIAG:  No diagnosis found.  Rationale for Evaluation and Treatment Rehabilitation  ONSET  DATE: MD order 02/19/2022  SUBJECTIVE:                                                                                                                                                                                                         SUBJECTIVE STATEMENT: Pt states the pain hung around after Rx and the pain eased yesterday.  Pt denies any adverse effects after prior Rx.  Pt denies pain currently.  Pt reports compliance with HEP.  Pt states she has difficulty using the tennis ball because it moves around too much, and the pillowcase  doesn't make a difference.      PERTINENT HISTORY:  Chronic pain DM Type 2, Macular degeneration, vertigo, occasional LBP  PAIN:  Are you having pain? Yes: NPRS scale: Current:  1/10 ; Worst:  6/10 ; Best: 1 /10 Pain location:  bilat UT Pain description: dull, tension, pressure Aggravating factors: Pt reports no specific factors.  Relieving factors: massages  PRECAUTIONS: Other: chronic pain  WEIGHT BEARING RESTRICTIONS No  FALLS:  Has patient fallen in last 6 months? No    OCCUPATION: Pt is retired  PLOF: Independent  PATIENT GOALS decrease pain level consistently to 0-1/10  OBJECTIVE:   DIAGNOSTIC FINDINGS:  CT in March 2023 for post neck mass:   IMPRESSION: Palpable mass likely reflects a lipoma   TODAY'S TREATMENT:    Manual Therapy: Reviewed pt presentation, response to prior Rx, HEP compliance, and pain level. Educated pt concerning POC including dry needling.    Pt received STM to bilat UT and STM to rhomboids, mid trap, medial scap mm in prone to improve soft tissue tightness and pain and reduce myofascial restrictions and adhesions.   Pt used theracane with PT direction to improve soft tissue mobility.   Therapeutic Exercise:     Standing rows with GTB with scap retraction 2x10 Standing shoulder extension with GTB with scap retraction 2x10 Shoulder ER bilat with RTB 2x10 with scap retraction Standing hz abd with YTB  with scap retraction    PATIENT EDUCATION:  Education details: POC, exercise rationale, exercise form, and HEP.  Using a tennis ball at home for soft tissue mobility.  Spoke to pt about dry needling.  Person educated: Patient Education method: Explanation, Demonstration, and Verbal cues Education comprehension: verbalized understanding, returned demonstration, verbal cues required, and needs further education   HOME EXERCISE PROGRAM: Access Code: [email or web address] URL: [email or web address] Date: 04/30/2022 Prepared by: Ronny Flurry   ASSESSMENT:  CLINICAL IMPRESSION: Pt is progressing well with pain, tightness, and sx's.  Pt has reduced H/A's.  She continues to have soft tissue tightness in bilat UT and medial scap mm though is improving.  Pt has trigger points in bilat UT, worse on L.  Pt performed exercises well  with cuing and instruction in correct form.  She responded well to Rx having no increased pain after Rx.  Pt should continue to benefit from continued skilled PT services to address goals and improve function.    OBJECTIVE IMPAIRMENTS decreased activity tolerance, decreased ROM, hypomobility, increased muscle spasms, impaired flexibility, postural dysfunction, and pain.   ACTIVITY LIMITATIONS lifting  PARTICIPATION LIMITATIONS:   PERSONAL FACTORS Chronic and 1-2 comorbidities: DM Type 2 and occasional LBP  are also affecting patient's functional outcome.   REHAB POTENTIAL: Good  CLINICAL DECISION MAKING: Evolving/moderate complexity  EVALUATION COMPLEXITY: Low   GOALS:  SHORT TERM GOALS: Target date: 05/11/2022  Pt will be independent and compliant with HEP for improved pain, ROM, function, and posture.  Baseline:  Goal status: GOAL MET  2.  Pt will demo at least an 8 deg increase in R Sb'ing for improved ROM, stiffness, and flexibility.  Baseline: R SB:  32 deg Goal status: INITIAL  3.  Pt will report at least a 25% improvement in pain and sx's  overall.  Baseline:  Goal status:  GOAL MET   LONG TERM GOALS: Target date: 06/08/2022  Pt will report at least a 70% improvement in pain and sx's overall.  Baseline:  Goal status: INITIAL  2.  Pt will demo improved posture with reduced  forward head and rounded shoulders for improved postural alignment and reduced stress on her cervical and thoracic spine for improved pain with daily activities.  Baseline:  Goal status: INITIAL  3.  Pt will perform postural stabilization exercises without adverse effects for improved postural strength for reduced stress on spine and for improved performance of household chores.  Baseline:  Goal status: INITIAL  4.  Pt will report having H/A's no > than 2 days/week. Baseline:  Goal status: INITIAL   PLAN: PT FREQUENCY: 2x/week  PT DURATION: other: 10 weeks  PLANNED INTERVENTIONS: Therapeutic exercises, Therapeutic activity, Neuromuscular re-education, Patient/Family education, Self Care, Joint mobilization, Aquatic Therapy, Dry Needling, Electrical stimulation, Cryotherapy, Moist heat, Taping, Ultrasound, Manual therapy, and Re-evaluation  PLAN FOR NEXT SESSION:  STW to bilat UT, cervical, and medial scap mm.  Postural strengthening and stabilization.  Pt to have dry needling next visit.  PN next visit.     Selinda Michaels III PT, DPT 06/02/22 1:30 PM

## 2022-06-04 ENCOUNTER — Encounter (HOSPITAL_BASED_OUTPATIENT_CLINIC_OR_DEPARTMENT_OTHER): Payer: Medicare Other | Admitting: Physical Therapy

## 2022-06-04 ENCOUNTER — Encounter (HOSPITAL_BASED_OUTPATIENT_CLINIC_OR_DEPARTMENT_OTHER): Payer: Self-pay | Admitting: Physical Therapy

## 2022-06-04 ENCOUNTER — Ambulatory Visit (HOSPITAL_BASED_OUTPATIENT_CLINIC_OR_DEPARTMENT_OTHER): Payer: Medicare Other | Attending: Nurse Practitioner | Admitting: Physical Therapy

## 2022-06-04 DIAGNOSIS — M62838 Other muscle spasm: Secondary | ICD-10-CM | POA: Insufficient documentation

## 2022-06-04 DIAGNOSIS — R293 Abnormal posture: Secondary | ICD-10-CM | POA: Insufficient documentation

## 2022-06-04 DIAGNOSIS — M542 Cervicalgia: Secondary | ICD-10-CM | POA: Diagnosis present

## 2022-06-04 DIAGNOSIS — M546 Pain in thoracic spine: Secondary | ICD-10-CM | POA: Diagnosis present

## 2022-06-04 NOTE — Therapy (Addendum)
OUTPATIENT PHYSICAL THERAPY TREATMENT NOTE  Progress Note Reporting Period 03/04/22 to 06/04/22  See note below for Objective Data and Assessment of Progress/Goals.       Patient Name: Jeanne Shaw MRN: [id number] DOB:02/22/1956, 66 y.o., female Today's Date: 06/04/2022      PT End of Session - 06/04/22 0849     Visit Number 10    Number of Visits 12    Date for PT Re-Evaluation 06/08/22    Authorization Type MCR part A and B    PT Start Time 0845    PT Stop Time 0926    PT Time Calculation (min) 41 min    Activity Tolerance Patient tolerated treatment well    Behavior During Therapy [facility] for tasks assessed/performed                   Past Medical History:  Diagnosis Date   Allergy    Anxiety    Asthma    Cataract    OU   Diabetes mellitus without complication ([place])    GERD (gastroesophageal reflux disease)    Hypertension    Macular degeneration    Dry OU   Subacute vaginitis 04/25/2021   Past Surgical History:  Procedure Laterality Date   BIOPSY  03/26/2020   Procedure: BIOPSY;  Surgeon: Harvel Quale, MD;  [facility]: [facility];  Service: Gastroenterology;;   COLONOSCOPY WITH PROPOFOL N/A 03/26/2020   Procedure: COLONOSCOPY WITH PROPOFOL;  Surgeon: Harvel Quale, MD;  [facility];  Service: Gastroenterology;  Laterality: N/A;  815   CYST EXCISION     off vocal cord    POLYPECTOMY  03/26/2020   Procedure: POLYPECTOMY;  Surgeon: Harvel Quale, MD;  [facility]: [facility];  Service: Gastroenterology;;   Patient Active Problem List   Diagnosis Date Noted   Fallen bladder 04/08/2022   Neck and shoulder pain 04/08/2022   Cervicogenic headache 04/08/2022   Elevated LFTs 11/24/2021   Gastroesophageal reflux disease 11/24/2021   Acute swimmer's ear of right side 11/24/2021   Allergies 11/24/2021   Cataract of both eyes 08/25/2021   Encounter for medical examination to establish care  04/25/2021   Leg pain, posterior, left 04/25/2021   Obesity (BMI 30.0-34.9) 09/02/2018   Hyperlipidemia 02/02/2018   DM (diabetes mellitus), type 2 with complications ([place]) 81/85/9093   Essential hypertension 08/02/2017   Rosacea 08/02/2017     REFERRING PROVIDER: [name], NP   REFERRING DIAG: M54.2,M25.519 (ICD-10-CM) - Neck and shoulder pain G44.86 (ICD-10-CM) - Cervicogenic headache   THERAPY DIAG:  Cervicalgia  Pain in thoracic spine  Rationale for Evaluation and Treatment Rehabilitation  ONSET DATE: MD order 02/19/2022  SUBJECTIVE:  SUBJECTIVE STATEMENT: Pt states the HA have improved but have had 1 since last visit. She notes she hstill has bilat UT pain.     PERTINENT HISTORY:  Chronic pain DM Type 2, Macular degeneration, vertigo, occasional LBP  PAIN:  Are you having pain? Yes: NPRS scale: Current:  2/10 ; Worst:  6/10 ; Best: 1 /10 Pain location:  bilat UT Pain description: dull, tension, pressure Aggravating factors: Pt reports no specific factors.  Relieving factors: massages  PRECAUTIONS: Other: chronic pain  WEIGHT BEARING RESTRICTIONS No  FALLS:  Has patient fallen in last 6 months? No  OCCUPATION: Pt is retired  PLOF: Independent  PATIENT GOALS decrease pain level consistently to 0-1/10  OBJECTIVE:   DIAGNOSTIC FINDINGS:  CT in March 2023 for post neck mass:   IMPRESSION: Palpable mass likely reflects a lipoma  FOTO 59 at eval FOTO 10th visit: 56  PALPATION:            Significant tightness in bilat UT, thoracic paraspinals, and rhomboid/mid trap     CERVICAL ROM:    Active ROM A/PROM (deg) eval AROM 11/2  Flexion [facility]   Extension 54 with tension 57  Right lateral flexion 32 with tension  37  Left lateral flexion 42 with  tension 43  Right rotation [facility] WFL  Left rotation [facility] WFL   (Blank rows = not tested)   UPPER EXTREMITY ROM:   Active ROM Right eval R 11/2 Left eval L 11/2  Shoulder flexion 158 with tension 162 153 with tension 150  Shoulder extension        Shoulder abduction 140 with tension 160 148 with tension 152  Shoulder adduction        Shoulder extension        Shoulder internal rotation        Shoulder external rotation        Elbow flexion        Elbow extension        Wrist flexion        Wrist extension        Wrist ulnar deviation        Wrist radial deviation        Wrist pronation        Wrist supination         (Blank rows = not tested)     TODAY'S TREATMENT:   11/2  Trigger Point Dry-Needling  Treatment instructions: Expect mild to moderate muscle soreness. S/S of pneumothorax if dry needled over a lung field, and to seek immediate medical attention should they occur. Patient verbalized understanding of these instructions and education.   Patient Consent Given: Yes Education (verbally/handout)provided: Yes Muscles treated: bilat UT Electrical stimulation performed: n/a Treatment response/outcome: LTR R> L  Manual STM bilat UT C2-6 UPA and CPA in supine grade III   -wall pec stretch 30s 3x -wall open book stretch 10x each way (done at wall in clinic, given picture of sidelying for ease at home) consider review at next or T/S ext with chair    Previous:  Manual Therapy: Reviewed pt presentation, response to prior Rx, HEP compliance, and pain level. Educated pt concerning POC including dry needling.    Pt received STM to bilat UT and STM to rhomboids, mid trap, medial scap mm in prone to improve soft tissue tightness and pain and reduce myofascial restrictions and adhesions.   Pt used theracane with PT direction to improve soft tissue mobility.   Therapeutic  Exercise:     Standing rows with GTB with scap retraction 2x10 Standing shoulder extension with  GTB with scap retraction 2x10 Shoulder ER bilat with RTB 2x10 with scap retraction Standing hz abd with YTB with scap retraction    PATIENT EDUCATION:  Education details: POC, exercise rationale, exercise form, and HEP.  Using a tennis ball at home for soft tissue mobility.  Spoke to pt about dry needling.  Person educated: Patient Education method: Explanation, Demonstration, and Verbal cues Education comprehension: verbalized understanding, returned demonstration, verbal cues required, and needs further education   HOME EXERCISE PROGRAM: Access Code: [email or web address] URL: [email or web address] Date: 04/30/2022 Prepared by: Ronny Flurry   ASSESSMENT:  CLINICAL IMPRESSION: Pt with improvement in C/S ROM following TPDN into rotation and lateral flexion from beginning of of session but similar objective and subjective measures. Of note, pt is very stiff into T/S which may also be contributing to current cervical pain. Consider addition of T/S mobilizations or self mobilizations for home. Pt did have positive response to new TPDN at today's session. Plan to assess for response to treatment at today's session and consider additional TPDN PRN for pain management and progression of upper quarter mobility. Pt should continue to benefit from continued skilled PT services to address goals and improve function.    OBJECTIVE IMPAIRMENTS decreased activity tolerance, decreased ROM, hypomobility, increased muscle spasms, impaired flexibility, postural dysfunction, and pain.   ACTIVITY LIMITATIONS lifting  PARTICIPATION LIMITATIONS:   PERSONAL FACTORS Chronic and 1-2 comorbidities: DM Type 2 and occasional LBP  are also affecting patient's functional outcome.   REHAB POTENTIAL: Good  CLINICAL DECISION MAKING: Evolving/moderate complexity  EVALUATION COMPLEXITY: Low   GOALS:  SHORT TERM GOALS: Target date: 05/11/2022  Pt will be independent and compliant with HEP for improved  pain, ROM, function, and posture.  Baseline:  Goal status: GOAL MET  2.  Pt will demo at least an 8 deg increase in R Sb'ing for improved ROM, stiffness, and flexibility.  Baseline: R SB:  32 deg Goal status: INITIAL  3.  Pt will report at least a 25% improvement in pain and sx's overall.  Baseline:  Goal status:  GOAL MET   LONG TERM GOALS: Target date: 06/08/2022  Pt will report at least a 70% improvement in pain and sx's overall.  Baseline:  Goal status: ongoing  2.  Pt will demo improved posture with reduced forward head and rounded shoulders for improved postural alignment and reduced stress on her cervical and thoracic spine for improved pain with daily activities.  Baseline:  Goal status: ongoing  3.  Pt will perform postural stabilization exercises without adverse effects for improved postural strength for reduced stress on spine and for improved performance of household chores.  Baseline:  Goal status:onoing  4.  Pt will report having H/A's no > than 2 days/week. Baseline:  Goal status: met   PLAN: PT FREQUENCY: 2x/week  PT DURATION: other: 10 weeks  PLANNED INTERVENTIONS: Therapeutic exercises, Therapeutic activity, Neuromuscular re-education, Patient/Family education, Self Care, Joint mobilization, Aquatic Therapy, Dry Needling, Electrical stimulation, Cryotherapy, Moist heat, Taping, Ultrasound, Manual therapy, and Re-evaluation  PLAN FOR NEXT SESSION:  STW to bilat UT, cervical, and medial scap mm.  Postural strengthening and stabilization.  Pt to have dry needling next visit.  PN next visit.    Daleen Bo PT, DPT 06/04/22 9:31 AM

## 2022-06-08 ENCOUNTER — Ambulatory Visit (HOSPITAL_BASED_OUTPATIENT_CLINIC_OR_DEPARTMENT_OTHER): Payer: Medicare Other | Admitting: Physical Therapy

## 2022-06-08 DIAGNOSIS — M546 Pain in thoracic spine: Secondary | ICD-10-CM

## 2022-06-08 DIAGNOSIS — M62838 Other muscle spasm: Secondary | ICD-10-CM

## 2022-06-08 DIAGNOSIS — M542 Cervicalgia: Secondary | ICD-10-CM

## 2022-06-08 DIAGNOSIS — R293 Abnormal posture: Secondary | ICD-10-CM

## 2022-06-08 NOTE — Progress Notes (Signed)
[facility] New Patient Evaluation and Consultation  Referring Provider: Early, Sung Amabile, NP PCP: Tollie Eth, NP Date of Service: 06/10/2022  SUBJECTIVE Chief Complaint: New Patient (Initial Visit) Jeanne Shaw is a 66 y.o. female here for a consult on prolapse./)  History of Present Illness: Jeanne Shaw is a 66 y.o. White or Caucasian female seen in consultation at the request of NP Huntley Dec Early for evaluation of bladder prolapse.    Review of records significant for: Pt reports irritation at the vaginal opening and feels her bladder has fallen.   Last A1c on 05/22/22 was 8.4%  Urinary Symptoms: Does not leak urine.   Day time voids 6-8.  Nocturia: 3-5 times per night to void. Voiding dysfunction: she does not empty her bladder well.  does not use a catheter to empty bladder.  When urinating, she feels a weak stream and the need to urinate multiple times in a row Drinks: 2 cups decaf coffee, 32oz water per day, has a glass of wine around bedtime.  She does snore at night  UTIs: Denies UTI's in the last year.   Denies history of blood in urine, kidney or bladder stones, pyelonephritis, bladder cancer, and kidney cancer  Pelvic Organ Prolapse Symptoms:                  She Denies a feeling of a bulge the vaginal area. Has occasional irritation at the opening- feels more like dryness and itchiness, but applies acquaphor and that improves her symptoms.   Bowel Symptom: Bowel movements: 1-2 time(s) per day Stool consistency: soft  Straining: no.  Splinting: no.  Incomplete evacuation: no.  She Denies accidental bowel leakage / fecal incontinence Bowel regimen: none Last colonoscopy: Date 2021, Results 8 polyps removed  Sexual Function Sexually active: yes.  Sexual orientation: Straight Pain with sex: Yes Pain reported at the vaginal opening and deep in the pelvis Not using a lubricant with intercourse.   Pelvic Pain Denies pelvic  pain   Past Medical History:  Past Medical History:  Diagnosis Date   Allergy    Anxiety    Asthma    Cataract    OU   Diabetes mellitus without complication ([facility])    GERD (gastroesophageal reflux disease)    Hypertension    Macular degeneration    Dry OU   Subacute vaginitis 04/25/2021     Past Surgical History:   Past Surgical History:  Procedure Laterality Date   BIOPSY  03/26/2020   Procedure: BIOPSY;  Surgeon: Dolores Frame, MD;  [facility];  Service: Gastroenterology;;   COLONOSCOPY WITH PROPOFOL N/A 03/26/2020   Procedure: COLONOSCOPY WITH PROPOFOL;  Surgeon: Dolores Frame, MD;  [facility];  Service: Gastroenterology;  Laterality: N/A;  815   CYST EXCISION     off vocal cord    POLYPECTOMY  03/26/2020   Procedure: POLYPECTOMY;  Surgeon: Marguerita Merles, Reuel Boom, MD;  [facility];  Service: Gastroenterology;;     Past OB/GYN History: OB History  Gravida Para Term Preterm AB Living  0 0 0 0 0 0  SAB IAB Ectopic Multiple Live Births  0 0 0 0 0    Menopausal: Yes, at age 66 Last pap smear was 2020 ?Marland Kitchen  Any history of abnormal pap smears: no.   Medications: She has a current medication list which includes the following prescription(s): vitamin c, aspirin ec, vitamin d, estradiol, fexofenadine, flaxseed (linseed), lisinopril, metformin, milk  thistle, preservision areds, fish oil, red yeast rice extract, ozempic (0.25 or 0.5 mg/dose), simvastatin, sodium chloride, zinc, and omeprazole.   Allergies: Patient is allergic to farxiga [dapagliflozin] and hydrocodone.   Social History:  Social History   Tobacco Use   Smoking status: Former    Packs/day: 0.25    Years: 30.00    Total pack years: 7.50    Types: Cigarettes    Quit date: 08/03/2015    Years since quitting: 6.8   Smokeless tobacco: Former  Building services engineer Use: Never used  Substance Use Topics   Alcohol use: Yes    Alcohol/week: 12.0  standard drinks of alcohol    Types: 12 Glasses of wine per week    Comment: beer occassionally   Drug use: No    Relationship status: married She lives with husband.   She is not employed. Regular exercise: No History of abuse: No  Family History:   Family History  Problem Relation Age of Onset   COPD Mother    Alcohol abuse Father    Diabetes Father    Heart disease Father    Hyperlipidemia Father    Hypertension Father    Cancer Sister    Diabetes Sister    Hyperlipidemia Sister    Hypertension Sister    Breast cancer Sister 61   Hypertension Brother    Alcohol abuse Maternal Aunt    Alcohol abuse Maternal Uncle    Hypertension Maternal Uncle    Alcohol abuse Paternal Aunt    Cancer Paternal Aunt    Alcohol abuse Paternal Uncle    Cancer Paternal Uncle    Diabetes Maternal Grandmother    Stroke Maternal Grandmother    Heart disease Maternal Grandfather      Review of Systems: Review of Systems  Constitutional:  Negative for fever, malaise/fatigue and weight loss.  Respiratory:  Negative for cough, shortness of breath and wheezing.   Cardiovascular:  Negative for chest pain, palpitations and leg swelling.  Gastrointestinal:  Negative for abdominal pain and blood in stool.  Genitourinary:  Negative for dysuria.  Musculoskeletal:  Negative for myalgias.  Skin:  Negative for rash.  Neurological:  Positive for headaches. Negative for dizziness.  Endo/Heme/Allergies:  Does not bruise/bleed easily.       Endorses hot flashes  Psychiatric/Behavioral:  Negative for depression. The patient is not nervous/anxious.      OBJECTIVE Physical Exam: Vitals:   06/10/22 1309  BP: 134/78  Pulse: 67  Weight: 169 lb (76.7 kg)  Height: 5\' 1"  (1.549 m)    Physical Exam Constitutional:      General: She is not in acute distress. Pulmonary:     Effort: Pulmonary effort is normal.  Abdominal:     General: There is no distension.     Palpations: Abdomen is soft.      Tenderness: There is no abdominal tenderness. There is no rebound.  Musculoskeletal:        General: No swelling. Normal range of motion.  Skin:    General: Skin is warm and dry.     Findings: No rash.  Neurological:     Mental Status: She is alert and oriented to person, place, and time.  Psychiatric:        Mood and Affect: Mood normal.        Behavior: Behavior normal.      GU / Detailed Urogynecologic Evaluation:  Pelvic Exam: Normal external female genitalia; Bartholin's and Skene's glands normal in  appearance; urethral meatus normal in appearance, no urethral masses or discharge.   CST: negative  Speculum exam reveals normal vaginal mucosa with significant atrophy. Cervix normal appearance. Uterus normal single, nontender. Adnexa normal adnexa.     Pelvic floor strength II/V  Pelvic floor musculature: Right levator non-tender, Right obturator non-tender, Left levator non-tender, Left obturator non-tender  POP-Q:   POP-Q  -3                                            Aa   -3                                           Ba  -7                                              C   2.5                                            Gh  5                                            Pb  7                                            tvl   -2                                            Ap  -2                                            Bp  -7                                              D      Rectal Exam:  Normal external rectum  Post-Void Residual (PVR): In order to evaluate bladder emptying, we discussed obtaining a postvoid residual and she agreed to this procedure.  Straight catheter was placed to obtain urine. A PVR of 10 ml was obtained by bladder scan.  Laboratory Results: POC urine: + glucose, otherwise negative   ASSESSMENT AND PLAN Ms. Austin-Lavoie is a 66 y.o. with:  1. Vaginal atrophy   2. Urinary frequency    Vaginal atrophy - No prolapse noted on  exam.  - Has significant vaginal atrophy, which is likely causing irritation and dyspareunia.  - Prescribed estrace cream 0.5g nightly for two weeks  then twice a week after for maintenance.  - Also recommended moisturizing as needed with coconut oil or vitamin E cream.  - For intercourse, use lubricant. Discussed silicone based lubricant can last longer than water based.   2. Nocturia - Recommended stopping bladder irritants such as caffeine and alcohol in afternoon.  - Stop drinking 2-3 hours prior to bedtime - She is not interested in a medication at this time.   Return as needed  Marguerita Beards, MD

## 2022-06-08 NOTE — Therapy (Signed)
OUTPATIENT PHYSICAL THERAPY TREATMENT NOTE      Patient Name: Jeanne Shaw MRN: [id number] DOB:August 25, 1955, 66 y.o., female Today's Date: 06/09/2022      PT End of Session - 06/08/22 1441     Visit Number 11    Number of Visits 17    Date for PT Re-Evaluation 06/29/22    Authorization Type MCR part A and B    PT Start Time 4621    PT Stop Time 1432    PT Time Calculation (min) 43 min    Activity Tolerance Patient tolerated treatment well    Behavior During Therapy WFL for tasks assessed/performed                 Past Medical History:  Diagnosis Date   Allergy    Anxiety    Asthma    Cataract    OU   Diabetes mellitus without complication ([place])    GERD (gastroesophageal reflux disease)    Hypertension    Macular degeneration    Dry OU   Subacute vaginitis 04/25/2021   Past Surgical History:  Procedure Laterality Date   BIOPSY  03/26/2020   Procedure: BIOPSY;  Surgeon: Harvel Quale, MD;  [facility];  Service: Gastroenterology;;   COLONOSCOPY WITH PROPOFOL N/A 03/26/2020   Procedure: COLONOSCOPY WITH PROPOFOL;  Surgeon: Harvel Quale, MD;  [facility];  Service: Gastroenterology;  Laterality: N/A;  815   CYST EXCISION     off vocal cord    POLYPECTOMY  03/26/2020   Procedure: POLYPECTOMY;  Surgeon: Harvel Quale, MD;  [facility];  Service: Gastroenterology;;   Patient Active Problem List   Diagnosis Date Noted   Fallen bladder 04/08/2022   Neck and shoulder pain 04/08/2022   Cervicogenic headache 04/08/2022   Elevated LFTs 11/24/2021   Gastroesophageal reflux disease 11/24/2021   Acute swimmer's ear of right side 11/24/2021   Allergies 11/24/2021   Cataract of both eyes 08/25/2021   Encounter for medical examination to establish care 04/25/2021   Leg pain, posterior, left 04/25/2021   Obesity (BMI 30.0-34.9) 09/02/2018   Hyperlipidemia 02/02/2018   DM (diabetes  mellitus), type 2 with complications ([place]) 94/71/2527   Essential hypertension 08/02/2017   Rosacea 08/02/2017     REFERRING PROVIDER: [name], NP   REFERRING DIAG: M54.2,M25.519 (ICD-10-CM) - Neck and shoulder pain G44.86 (ICD-10-CM) - Cervicogenic headache   THERAPY DIAG:  Cervicalgia  Pain in thoracic spine  Other muscle spasm  Abnormal posture  Rationale for Evaluation and Treatment Rehabilitation  ONSET DATE: MD order 02/19/2022  SUBJECTIVE:  SUBJECTIVE STATEMENT: Pt denies any adverse effects after prior Rx.  Pt states the dry needling helped.  "I can feel a significant difference b/w the L side and R side".  Pt reports minimal tension on L side though still has tightness on R.  "Significant difference from when I first started." She performed her exercises later in the day after the dry needling which helped her tightness.     PERTINENT HISTORY:  Chronic pain DM Type 2, Macular degeneration, vertigo, occasional LBP  PAIN:  Are you having pain? Yes: NPRS scale: Current:  1/10 ; Worst:  6/10 ; Best: 1 /10 Pain location:  R UT Pain description: dull, tension, pressure Aggravating factors: Pt reports no specific factors.  Relieving factors: massages  PRECAUTIONS: Other: chronic pain  WEIGHT BEARING RESTRICTIONS No  FALLS:  Has patient fallen in last 6 months? No  OCCUPATION: Pt is retired  PLOF: Independent  PATIENT GOALS decrease pain level consistently to 0-1/10  OBJECTIVE:   DIAGNOSTIC FINDINGS:  CT in March 2023 for post neck mass:   IMPRESSION: Palpable mass likely reflects a lipoma  Prior Rx:  FOTO 59 at eval FOTO 10th visit: 58  PALPATION:            Significant tightness in bilat UT, thoracic paraspinals, and rhomboid/mid trap     CERVICAL  ROM:    Active ROM A/PROM (deg) eval AROM 11/2  Flexion [facility]   Extension 54 with tension 57  Right lateral flexion 32 with tension  37  Left lateral flexion 42 with tension 43  Right rotation [facility] WFL  Left rotation WFL WFL   (Blank rows = not tested)   UPPER EXTREMITY ROM:   Active ROM Right eval R 11/2 Left eval L 11/2  Shoulder flexion 158 with tension 162 153 with tension 150  Shoulder extension        Shoulder abduction 140 with tension 160 148 with tension 152  Shoulder adduction        Shoulder extension        Shoulder internal rotation        Shoulder external rotation        Elbow flexion        Elbow extension        Wrist flexion        Wrist extension        Wrist ulnar deviation        Wrist radial deviation        Wrist pronation        Wrist supination         (Blank rows = not tested)     TODAY'S TREATMENT:   Manual Therapy: -Reviewed pt presentation, response to prior Rx, HEP compliance, and pain level. -Pt received STM to bilat UT and IASTM to superomedial scapula seated to improve soft tissue tightness and pain and reduce myofascial restrictions and adhesions.  -Pt received C3-6 UPA and in supine grade II - III   Therapeutic Exercise:   -Pt received supine manual UT stretch 2x30 sec bilat -wall pec stretch 30s 2x -Open book stretch in S/L and standing at wall 10x bilat each way       PATIENT EDUCATION:  Education details: POC, exercise rationale, exercise form, and HEP.  Using a tennis ball at home for soft tissue mobility.  Spoke to pt about dry needling.  Person educated: Patient Education method: Explanation, Demonstration, and Verbal cues Education comprehension: verbalized understanding, returned demonstration, verbal  cues required, and needs further education   HOME EXERCISE PROGRAM: Access Code: [email or web address] URL: [email or web address] Date: 04/30/2022 Prepared by: Ronny Flurry   ASSESSMENT:  CLINICAL  IMPRESSION: Pt is making good progress with pain, sx's, and function.  Pt reports she is feeling much better and notices a significant difference from when she first started PT.  Pt continues to have tightness in cervical, UT, and medial scap mm though has made good improvement.  Pt is compliant with HEP.  Pt has met STG's #1,3 and LTG #4 and is progressing toward other goals.  Pt responds well to Rx including manual therapy and reports improved sx's after STW.  She reports improved tightness and tension in R UT and feels like her L side after Rx.  Pt should continue to benefit from continued skilled PT services to address goals and improve function.     OBJECTIVE IMPAIRMENTS decreased activity tolerance, decreased ROM, hypomobility, increased muscle spasms, impaired flexibility, postural dysfunction, and pain.   ACTIVITY LIMITATIONS lifting  PARTICIPATION LIMITATIONS:   PERSONAL FACTORS Chronic and 1-2 comorbidities: DM Type 2 and occasional LBP  are also affecting patient's functional outcome.   REHAB POTENTIAL: Good  CLINICAL DECISION MAKING: Evolving/moderate complexity  EVALUATION COMPLEXITY: Low   GOALS:  SHORT TERM GOALS: Target date: 05/11/2022  Pt will be independent and compliant with HEP for improved pain, ROM, function, and posture.  Baseline:  Goal status: GOAL MET  2.  Pt will demo at least an 8 deg increase in R Sb'ing for improved ROM, stiffness, and flexibility.  Baseline: R SB:  32 deg Goal status: progressing  3.  Pt will report at least a 25% improvement in pain and sx's overall.  Baseline:  Goal status:  GOAL MET   LONG TERM GOALS: Target date: 06/29/2022   Pt will report at least a 70% improvement in pain and sx's overall.  Baseline:  Goal status: ongoing  2.  Pt will demo improved posture with reduced forward head and rounded shoulders for improved postural alignment and reduced stress on her cervical and thoracic spine for improved pain with daily  activities.  Baseline:  Goal status: ongoing  3.  Pt will perform postural stabilization exercises without adverse effects for improved postural strength for reduced stress on spine and for improved performance of household chores.  Baseline:  Goal status:onoing  4.  Pt will report having H/A's no > than 2 days/week. Baseline:  Goal status: met   PLAN: PT FREQUENCY: 2x/week  PT DURATION: other: 3 weeks  PLANNED INTERVENTIONS: Therapeutic exercises, Therapeutic activity, Neuromuscular re-education, Patient/Family education, Self Care, Joint mobilization, Aquatic Therapy, Dry Needling, Electrical stimulation, Cryotherapy, Moist heat, Taping, Ultrasound, Manual therapy, and Re-evaluation  PLAN FOR NEXT SESSION:  STW to bilat UT, cervical, and medial scap mm.  Postural strengthening and stabilization.  Pt to have dry needling next visit.    Selinda Michaels III PT, DPT 06/09/22 10:21 AM

## 2022-06-09 ENCOUNTER — Encounter (HOSPITAL_BASED_OUTPATIENT_CLINIC_OR_DEPARTMENT_OTHER): Payer: Self-pay | Admitting: Physical Therapy

## 2022-06-09 ENCOUNTER — Ambulatory Visit (HOSPITAL_BASED_OUTPATIENT_CLINIC_OR_DEPARTMENT_OTHER): Payer: Medicare Other | Admitting: Physical Therapy

## 2022-06-09 DIAGNOSIS — M546 Pain in thoracic spine: Secondary | ICD-10-CM

## 2022-06-09 DIAGNOSIS — M62838 Other muscle spasm: Secondary | ICD-10-CM

## 2022-06-09 DIAGNOSIS — R293 Abnormal posture: Secondary | ICD-10-CM

## 2022-06-09 DIAGNOSIS — M542 Cervicalgia: Secondary | ICD-10-CM

## 2022-06-09 NOTE — Therapy (Signed)
OUTPATIENT PHYSICAL THERAPY TREATMENT NOTE      Patient Name: Jeanne Shaw MRN: [id number] DOB:1956/04/04, 66 y.o., female Today's Date: 06/09/2022      PT End of Session - 06/09/22 1131     Visit Number 12    Number of Visits 17    Date for PT Re-Evaluation 06/29/22    Authorization Type MCR part A and B    PT Start Time 1102    PT Stop Time 1135    PT Time Calculation (min) 33 min    Activity Tolerance Patient tolerated treatment well    Behavior During Therapy WFL for tasks assessed/performed                  Past Medical History:  Diagnosis Date   Allergy    Anxiety    Asthma    Cataract    OU   Diabetes mellitus without complication ([place])    GERD (gastroesophageal reflux disease)    Hypertension    Macular degeneration    Dry OU   Subacute vaginitis 04/25/2021   Past Surgical History:  Procedure Laterality Date   BIOPSY  03/26/2020   Procedure: BIOPSY;  Surgeon: Harvel Quale, MD;  [facility];  Service: Gastroenterology;;   COLONOSCOPY WITH PROPOFOL N/A 03/26/2020   Procedure: COLONOSCOPY WITH PROPOFOL;  Surgeon: Harvel Quale, MD;  [facility];  Service: Gastroenterology;  Laterality: N/A;  815   CYST EXCISION     off vocal cord    POLYPECTOMY  03/26/2020   Procedure: POLYPECTOMY;  Surgeon: Harvel Quale, MD;  [facility];  Service: Gastroenterology;;   Patient Active Problem List   Diagnosis Date Noted   Fallen bladder 04/08/2022   Neck and shoulder pain 04/08/2022   Cervicogenic headache 04/08/2022   Elevated LFTs 11/24/2021   Gastroesophageal reflux disease 11/24/2021   Acute swimmer's ear of right side 11/24/2021   Allergies 11/24/2021   Cataract of both eyes 08/25/2021   Encounter for medical examination to establish care 04/25/2021   Leg pain, posterior, left 04/25/2021   Obesity (BMI 30.0-34.9) 09/02/2018   Hyperlipidemia 02/02/2018   DM (diabetes  mellitus), type 2 with complications ([place]) 97/84/7841   Essential hypertension 08/02/2017   Rosacea 08/02/2017     REFERRING PROVIDER: [name], NP   REFERRING DIAG: M54.2,M25.519 (ICD-10-CM) - Neck and shoulder pain G44.86 (ICD-10-CM) - Cervicogenic headache   THERAPY DIAG:  Cervicalgia  Pain in thoracic spine  Other muscle spasm  Abnormal posture  Rationale for Evaluation and Treatment Rehabilitation  ONSET DATE: MD order 02/19/2022  SUBJECTIVE:  SUBJECTIVE STATEMENT: Pt states that the TPDN helped after last session. She noticed that she did have the same level of pain as she anticipated. Pt has vertigo this morning that has flared up.    PERTINENT HISTORY:  Chronic pain DM Type 2, Macular degeneration, vertigo, occasional LBP  PAIN:  Are you having pain? Yes: NPRS scale: Current:  2/10 ; Worst:  6/10 ; Best: 1 /10 Pain location:  R UT Pain description: dull, tension, pressure Aggravating factors: Pt reports no specific factors.  Relieving factors: massages  PRECAUTIONS: Other: chronic pain  WEIGHT BEARING RESTRICTIONS No  FALLS:  Has patient fallen in last 6 months? No  OCCUPATION: Pt is retired  PLOF: Independent  PATIENT GOALS decrease pain level consistently to 0-1/10  OBJECTIVE:   DIAGNOSTIC FINDINGS:  CT in March 2023 for post neck mass:   IMPRESSION: Palpable mass likely reflects a lipoma  Prior Rx:  FOTO 59 at eval FOTO 10th visit: 56    TODAY'S TREATMENT:    11/7   Trigger Point Dry-Needling  Treatment instructions: Expect mild to moderate muscle soreness. S/S of pneumothorax if dry needled over a lung field, and to seek immediate medical attention should they occur. Patient verbalized understanding of these instructions and  education.   Patient Consent Given: Yes Education (verbally/handout)provided: Yes Muscles treated: bilat UT Electrical stimulation performed: n/a Treatment response/outcome: LTR R> L   Manual STM bilat UT T3-T10 UPA and CPA in prone grade III (cavitation elicited with first pass)   -wall pec stretch 30s 3x -s/l open book stretch 10x each way  -T/S ext with towel roll 10x 3s hold   11/6 Manual Therapy: -Reviewed pt presentation, response to prior Rx, HEP compliance, and pain level. -Pt received STM to bilat UT and IASTM to superomedial scapula seated to improve soft tissue tightness and pain and reduce myofascial restrictions and adhesions.  -Pt received C3-6 UPA and in supine grade II - III   Therapeutic Exercise:   -Pt received supine manual UT stretch 2x30 sec bilat -wall pec stretch 30s 2x -Open book stretch in S/L and standing at wall 10x bilat each way       PATIENT EDUCATION:  Education details: POC, exercise rationale, exercise form, and HEP.  Using a tennis ball at home for soft tissue mobility.  Spoke to pt about dry needling.  Person educated: Patient Education method: Explanation, Demonstration, and Verbal cues Education comprehension: verbalized understanding, returned demonstration, verbal cues required, and needs further education   HOME EXERCISE PROGRAM: Access Code: [email or web address] URL: [email or web address] Date: 04/30/2022 Prepared by: Ronny Flurry   ASSESSMENT:  CLINICAL IMPRESSION: Pt with good response to repeat of TPDN with more locations done in each UT. Strong LTR elicited on R>L. Pt had reduction of pain to 1/10 by end of session. Pt then able to continue with T/S mobility as pt is very kyphotic which like contributes to neck and upper quarter pain. Plan to TPDN PRN for pain management as pt continues with correct exercise with primary therapist.  Pt should continue to benefit from continued skilled PT services to address goals and  improve function.     OBJECTIVE IMPAIRMENTS decreased activity tolerance, decreased ROM, hypomobility, increased muscle spasms, impaired flexibility, postural dysfunction, and pain.   ACTIVITY LIMITATIONS lifting  PARTICIPATION LIMITATIONS:   PERSONAL FACTORS Chronic and 1-2 comorbidities: DM Type 2 and occasional LBP  are also affecting patient's functional outcome.   REHAB POTENTIAL: Good  CLINICAL DECISION MAKING: Evolving/moderate complexity  EVALUATION  COMPLEXITY: Low   GOALS:  SHORT TERM GOALS: Target date: 05/11/2022  Pt will be independent and compliant with HEP for improved pain, ROM, function, and posture.  Baseline:  Goal status: GOAL MET  2.  Pt will demo at least an 8 deg increase in R Sb'ing for improved ROM, stiffness, and flexibility.  Baseline: R SB:  32 deg Goal status: progressing  3.  Pt will report at least a 25% improvement in pain and sx's overall.  Baseline:  Goal status:  GOAL MET   LONG TERM GOALS: Target date: 06/29/2022   Pt will report at least a 70% improvement in pain and sx's overall.  Baseline:  Goal status: ongoing  2.  Pt will demo improved posture with reduced forward head and rounded shoulders for improved postural alignment and reduced stress on her cervical and thoracic spine for improved pain with daily activities.  Baseline:  Goal status: ongoing  3.  Pt will perform postural stabilization exercises without adverse effects for improved postural strength for reduced stress on spine and for improved performance of household chores.  Baseline:  Goal status:onoing  4.  Pt will report having H/A's no > than 2 days/week. Baseline:  Goal status: met   PLAN: PT FREQUENCY: 2x/week  PT DURATION: other: 3 weeks  PLANNED INTERVENTIONS: Therapeutic exercises, Therapeutic activity, Neuromuscular re-education, Patient/Family education, Self Care, Joint mobilization, Aquatic Therapy, Dry Needling, Electrical stimulation,  Cryotherapy, Moist heat, Taping, Ultrasound, Manual therapy, and Re-evaluation  PLAN FOR NEXT SESSION:  STW to bilat UT, cervical, and medial scap mm.  Postural strengthening and stabilization. Consider chin nod with towel roll at occiput  Daleen Bo PT, DPT 06/09/22 11:38 AM

## 2022-06-10 ENCOUNTER — Ambulatory Visit (INDEPENDENT_AMBULATORY_CARE_PROVIDER_SITE_OTHER): Payer: Medicare Other | Admitting: Obstetrics and Gynecology

## 2022-06-10 ENCOUNTER — Encounter: Payer: Self-pay | Admitting: Obstetrics and Gynecology

## 2022-06-10 VITALS — BP 134/78 | HR 67 | Ht 61.0 in | Wt 169.0 lb

## 2022-06-10 DIAGNOSIS — R350 Frequency of micturition: Secondary | ICD-10-CM

## 2022-06-10 DIAGNOSIS — R351 Nocturia: Secondary | ICD-10-CM | POA: Diagnosis not present

## 2022-06-10 DIAGNOSIS — N952 Postmenopausal atrophic vaginitis: Secondary | ICD-10-CM

## 2022-06-10 LAB — POCT URINALYSIS DIPSTICK
Bilirubin, UA: NEGATIVE
Blood, UA: NEGATIVE
Glucose, UA: POSITIVE — AB
Ketones, UA: POSITIVE
Leukocytes, UA: NEGATIVE
Nitrite, UA: NEGATIVE
Protein, UA: NEGATIVE
Spec Grav, UA: >=1.03 — AB (ref 1.010–1.025)
Urobilinogen, UA: 0.2 E.U./dL
pH, UA: 5 (ref 5.0–8.0)

## 2022-06-10 MED ORDER — ESTRADIOL 0.1 MG/GM VA CREA: TOPICAL_CREAM | VAGINAL | 11 refills | Status: DC

## 2022-06-10 NOTE — Patient Instructions (Addendum)
Today we talked about ways to manage bladder urgency such as altering your diet to avoid irritative beverages and foods (bladder diet) as well as attempting to decrease stress and other exacerbating factors.    Stop drinking 2-3 hours prior to bedtime.   The Most Bothersome Foods* The Least Bothersome Foods*  Coffee - Regular & Decaf Tea - caffeinated Carbonated beverages - cola, non-colas, diet & caffeine-free Alcohols - Beer, Red Wine, White Wine, Champagne Fruits - Grapefruit, [place], Orange, [employer] - Cranberry, Grapefruit, Orange, Pineapple Vegetables - Tomato & Tomato Products Flavor Enhancers - Hot peppers, Spicy foods, Chili, Horseradish, Vinegar, Monosodium glutamate (MSG) Artificial Sweeteners - NutraSweet, Sweet 'N Low, Equal (sweetener), Saccharin Ethnic foods - [place], [place], [place] food [employer]s - low-fat & whole Fruits - Bananas, Blueberries, Honeydew melon, Pears, Raisins, Watermelon Vegetables - Broccoli, Brussels Sprouts, [place], Carrots, Cauliflower, [place], Cucumber, Mushrooms, Peas, Radishes, Squash, Zucchini, White potatoes, Sweet potatoes & yams Poultry - Chicken, Eggs, [place], [employer] - Beef, Programmer, multimedia, Lamb Seafood - Shrimp, [place] fish, Salmon Grains - Oat, Rice Snacks - Pretzels, Popcorn  *Lissa Morales et al. Diet and its role in interstitial cystitis/bladder pain syndrome (IC/BPS) and comorbid conditions. [place] 2012 Jan 11.    Start vaginal estrogen therapy nightly for two weeks then 2 times weekly at night for treatment of vaginal atrophy (dryness of the vaginal tissues).  Please let us know if the prescription is too expensive and we can look for alternative options.   Vulvovaginal moisturizer Options: Vitamin E oil (pump or capsule) or cream (Gene's Vit E Cream) Coconut oil Silicone-based lubricant for use during intercourse ("wet platinum" is a brand available at most drugstores) Crisco Consider the ingredients of the  product - the fewer the ingredients the better!  Directions for Use: Clean and dry your hands Gently dab the vulvar/vaginal area dry as needed Apply a "pea-sized" amount of the moisturizer onto your fingertip Using you other hand, open the labia  Apply the moisturizer to the vulvar/vaginal tissues Wear loose fitting underwear/clothing if possible following application Use moisturize up to 3 times daily as desired.

## 2022-06-22 ENCOUNTER — Encounter (HOSPITAL_BASED_OUTPATIENT_CLINIC_OR_DEPARTMENT_OTHER): Payer: Self-pay | Admitting: Physical Therapy

## 2022-06-22 ENCOUNTER — Ambulatory Visit (HOSPITAL_BASED_OUTPATIENT_CLINIC_OR_DEPARTMENT_OTHER): Payer: Medicare Other | Admitting: Physical Therapy

## 2022-06-22 DIAGNOSIS — M546 Pain in thoracic spine: Secondary | ICD-10-CM

## 2022-06-22 DIAGNOSIS — M542 Cervicalgia: Secondary | ICD-10-CM

## 2022-06-22 DIAGNOSIS — R293 Abnormal posture: Secondary | ICD-10-CM

## 2022-06-22 DIAGNOSIS — M62838 Other muscle spasm: Secondary | ICD-10-CM

## 2022-06-22 NOTE — Therapy (Addendum)
OUTPATIENT PHYSICAL THERAPY TREATMENT NOTE      Patient Name: Jeanne Shaw MRN: [id number] DOB:July 25, 1956, 66 y.o., female Today's Date: 06/22/2022      PT End of Session - 06/22/22 0851     Visit Number 13    Number of Visits 17    Date for PT Re-Evaluation 06/29/22    Authorization Type MCR part A and B    PT Start Time 0802    PT Stop Time 0842    PT Time Calculation (min) 40 min    Activity Tolerance Patient tolerated treatment well    Behavior During Therapy [facility] for tasks assessed/performed                   Past Medical History:  Diagnosis Date   Allergy    Anxiety    Asthma    Cataract    OU   Diabetes mellitus without complication ([place])    GERD (gastroesophageal reflux disease)    Hypertension    Macular degeneration    Dry OU   Subacute vaginitis 04/25/2021   Past Surgical History:  Procedure Laterality Date   BIOPSY  03/26/2020   Procedure: BIOPSY;  Surgeon: Harvel Quale, MD;  [facility];  Service: Gastroenterology;;   COLONOSCOPY WITH PROPOFOL N/A 03/26/2020   Procedure: COLONOSCOPY WITH PROPOFOL;  Surgeon: Harvel Quale, MD;  [facility];  Service: Gastroenterology;  Laterality: N/A;  815   CYST EXCISION     off vocal cord    POLYPECTOMY  03/26/2020   Procedure: POLYPECTOMY;  Surgeon: Harvel Quale, MD;  [facility];  Service: Gastroenterology;;   Patient Active Problem List   Diagnosis Date Noted   Fallen bladder 04/08/2022   Neck and shoulder pain 04/08/2022   Cervicogenic headache 04/08/2022   Elevated LFTs 11/24/2021   Gastroesophageal reflux disease 11/24/2021   Acute swimmer's ear of right side 11/24/2021   Allergies 11/24/2021   Cataract of both eyes 08/25/2021   Encounter for medical examination to establish care 04/25/2021   Leg pain, posterior, left 04/25/2021   Obesity (BMI 30.0-34.9) 09/02/2018   Hyperlipidemia 02/02/2018   DM (diabetes  mellitus), type 2 with complications ([place]) 62/70/3500   Essential hypertension 08/02/2017   Rosacea 08/02/2017     REFERRING PROVIDER: [name], NP   REFERRING DIAG: M54.2,M25.519 (ICD-10-CM) - Neck and shoulder pain G44.86 (ICD-10-CM) - Cervicogenic headache   THERAPY DIAG:  Cervicalgia  Pain in thoracic spine  Other muscle spasm  Abnormal posture  Rationale for Evaluation and Treatment Rehabilitation  ONSET DATE: MD order 02/19/2022  SUBJECTIVE:  SUBJECTIVE STATEMENT: Pt states she has been good overall since the last session though has had increased tightness over the last few days.  Pt states she noticed pain/irritation after thoracic jt mobs the day after last visit.  She noticed increased tightness after performing yard work.  Pt states that the dry needling helped more the first session than the last session.  Pt reports she has noticed tension in bilat UT as she has been sitting for the past 5 mins.  Pt reports she has more tightness/tension currently than pain.  Pt reports compliance with HEP.  Pt states she ordered a theracane.   PERTINENT HISTORY:  Chronic pain DM Type 2, Macular degeneration, vertigo, occasional LBP   PRECAUTIONS: Other: chronic pain  WEIGHT BEARING RESTRICTIONS No  FALLS:  Has patient fallen in last 6 months? No  OCCUPATION: Pt is retired  PLOF: Independent  PATIENT GOALS decrease pain level consistently to 0-1/10  OBJECTIVE:   DIAGNOSTIC FINDINGS:  CT in March 2023 for post neck mass:   IMPRESSION: Palpable mass likely reflects a lipoma  TODAY'S TREATMENT:   Manual Therapy: -Reviewed pt presentation, response to prior Rx, HEP compliance, and pain level. -Pt received STM and IASTM to bilat UT and superomedial scapula seated to  improve soft tissue tightness and pain and reduce myofascial restrictions and adhesions.  -Pt received supine STM to bilat cervical paraspinals and suboccipitals to improve soft tissue tightness and pain.    Therapeutic Exercise:   -Pt received supine manual UT stretch 3x30 sec bilat -T/S ext with towel roll 10x 3s hold -Open book stretch in S/L and standing at wall 10x bilat each way       PATIENT EDUCATION:  Education details:  Cont with HEP as instructed. POC and exercise form.  Person educated: Patient Education method: Explanation, Demonstration, and Verbal cues Education comprehension: verbalized understanding, returned demonstration, verbal cues required   HOME EXERCISE PROGRAM: Access Code: [email or web address] URL: [email or web address] Date: 04/30/2022 Prepared by: Ronny Flurry   ASSESSMENT:  CLINICAL IMPRESSION: Pt states she has been doing well overall since last Rx though recently has had increased tightness. Overall she is doing much better than prior to starting PT.  She notices increased tightness with performing yard work.  Pt is compliant with HEP and is waiting for a theracane.  Pt continues to have soft tissue tightness in bilat UT and cervical paraspinals though has improved overall.  Pt responded well to Rx having no c/o's after Rx.     OBJECTIVE IMPAIRMENTS decreased activity tolerance, decreased ROM, hypomobility, increased muscle spasms, impaired flexibility, postural dysfunction, and pain.   ACTIVITY LIMITATIONS lifting  PARTICIPATION LIMITATIONS:   PERSONAL FACTORS Chronic and 1-2 comorbidities: DM Type 2 and occasional LBP  are also affecting patient's functional outcome.   REHAB POTENTIAL: Good  CLINICAL DECISION MAKING: Evolving/moderate complexity  EVALUATION COMPLEXITY: Low   GOALS:  SHORT TERM GOALS: Target date: 05/11/2022  Pt will be independent and compliant with HEP for improved pain, ROM, function, and posture.  Baseline:   Goal status: GOAL MET  2.  Pt will demo at least an 8 deg increase in R Sb'ing for improved ROM, stiffness, and flexibility.  Baseline: R SB:  32 deg Goal status: progressing  3.  Pt will report at least a 25% improvement in pain and sx's overall.  Baseline:  Goal status:  GOAL MET   LONG TERM GOALS: Target date: 06/29/2022   Pt will report at least a 70% improvement in pain and  sx's overall.  Baseline:  Goal status: ongoing  2.  Pt will demo improved posture with reduced forward head and rounded shoulders for improved postural alignment and reduced stress on her cervical and thoracic spine for improved pain with daily activities.  Baseline:  Goal status: ongoing  3.  Pt will perform postural stabilization exercises without adverse effects for improved postural strength for reduced stress on spine and for improved performance of household chores.  Baseline:  Goal status:onoing  4.  Pt will report having H/A's no > than 2 days/week. Baseline:  Goal status: met   PLAN: PT FREQUENCY: 2x/week  PT DURATION: other: 3 weeks  PLANNED INTERVENTIONS: Therapeutic exercises, Therapeutic activity, Neuromuscular re-education, Patient/Family education, Self Care, Joint mobilization, Aquatic Therapy, Dry Needling, Electrical stimulation, Cryotherapy, Moist heat, Taping, Ultrasound, Manual therapy, and Re-evaluation  PLAN FOR NEXT SESSION:  STW to bilat UT, cervical, and medial scap mm.  Postural strengthening and stabilization.  PN vs discharge next visit.  Selinda Michaels III PT, DPT 06/22/22 10:03 PM

## 2022-06-28 NOTE — Therapy (Incomplete)
OUTPATIENT PHYSICAL THERAPY TREATMENT NOTE / DISCHARGE SUMMARY  Progress Note Reporting Period 06/08/22 to 1127/2023  See note below for Objective Data and Assessment of Progress/Goals.          Patient Name: Jeanne Shaw MRN: [id number] DOB:1955/09/01, 66 y.o., female Today's Date: 06/22/2022      PT End of Session - 06/22/22 0851     Visit Number 13    Number of Visits 17    Date for PT Re-Evaluation 06/29/22    Authorization Type MCR part A and B    PT Start Time 0802    PT Stop Time 0842    PT Time Calculation (min) 40 min    Activity Tolerance Patient tolerated treatment well    Behavior During Therapy [facility] for tasks assessed/performed                   Past Medical History:  Diagnosis Date   Allergy    Anxiety    Asthma    Cataract    OU   Diabetes mellitus without complication ([place])    GERD (gastroesophageal reflux disease)    Hypertension    Macular degeneration    Dry OU   Subacute vaginitis 04/25/2021   Past Surgical History:  Procedure Laterality Date   BIOPSY  03/26/2020   Procedure: BIOPSY;  Surgeon: Harvel Quale, MD;  [facility];  Service: Gastroenterology;;   COLONOSCOPY WITH PROPOFOL N/A 03/26/2020   Procedure: COLONOSCOPY WITH PROPOFOL;  Surgeon: Harvel Quale, MD;  [facility];  Service: Gastroenterology;  Laterality: N/A;  815   CYST EXCISION     off vocal cord    POLYPECTOMY  03/26/2020   Procedure: POLYPECTOMY;  Surgeon: Harvel Quale, MD;  [facility];  Service: Gastroenterology;;   Patient Active Problem List   Diagnosis Date Noted   Fallen bladder 04/08/2022   Neck and shoulder pain 04/08/2022   Cervicogenic headache 04/08/2022   Elevated LFTs 11/24/2021   Gastroesophageal reflux disease 11/24/2021   Acute swimmer's ear of right side 11/24/2021   Allergies 11/24/2021   Cataract of both eyes 08/25/2021   Encounter for medical  examination to establish care 04/25/2021   Leg pain, posterior, left 04/25/2021   Obesity (BMI 30.0-34.9) 09/02/2018   Hyperlipidemia 02/02/2018   DM (diabetes mellitus), type 2 with complications ([place]) 27/25/3664   Essential hypertension 08/02/2017   Rosacea 08/02/2017     REFERRING PROVIDER: [name], NP   REFERRING DIAG: M54.2,M25.519 (ICD-10-CM) - Neck and shoulder pain G44.86 (ICD-10-CM) - Cervicogenic headache   THERAPY DIAG:  Cervicalgia  Pain in thoracic spine  Other muscle spasm  Abnormal posture  Rationale for Evaluation and Treatment Rehabilitation  ONSET DATE: MD order 02/19/2022  SUBJECTIVE:  SUBJECTIVE STATEMENT: Pt is doing well overall.  Pt reports compliance with HEP.  Pt received the theracane and has been using it at home.  Pt states she was sore after prior Rx though had no increased pain.  Pt reports 90-95% improvement in pain and sx's overall.  Pt does feel the tightness with certain positioning including putting a jigsaw puzzle together.  Pt denies pain currently, just a little irritation.  Worst pain:  3/10.    PERTINENT HISTORY:  Chronic pain DM Type 2, Macular degeneration, vertigo, occasional LBP   PRECAUTIONS: Other: chronic pain  WEIGHT BEARING RESTRICTIONS No  FALLS:  Has patient fallen in last 6 months? No  OCCUPATION: Pt is retired  PLOF: Independent  PATIENT GOALS decrease pain level consistently to 0-1/10  OBJECTIVE:   DIAGNOSTIC FINDINGS:  CT in March 2023 for post neck mass:   IMPRESSION: Palpable mass likely reflects a lipoma  TODAY'S TREATMENT:   Cervical AROM: SB:  R/L:  45/44 deg Rotation:  WFL  Manual Therapy: -Reviewed pt presentation, response to prior Rx, HEP compliance, and pain level. -Pt received IASTM to  bilat UT and superomedial scapula seated to improve soft tissue tightness and pain and reduce myofascial restrictions and adhesions.  -Pt received supine STM to bilat cervical paraspinals and suboccipitals to improve soft tissue tightness and pain.    Therapeutic Exercise:   -Reviewed HEP.  Gave pt an updated HEP handout. -Open book stretch in S/L 10x bilat  -Doorway Pec stretch single arm 2x30 sec bilat      PATIENT EDUCATION:  Education details:  Cont with HEP as instructed. POC and exercise form.  Person educated: Patient Education method: Explanation, Demonstration, and Verbal cues Education comprehension: verbalized understanding, returned demonstration, verbal cues required   HOME EXERCISE PROGRAM: Access Code: [email or web address] URL: [email or web address] Date: 04/30/2022 Prepared by: Ronny Flurry   ASSESSMENT:  CLINICAL IMPRESSION: Pt states she has been doing well overall since last Rx though recently has had increased tightness. Overall she is doing much better than prior to starting PT.  She notices increased tightness with performing yard work.  Pt is compliant with HEP and is waiting for a theracane.  Pt continues to have soft tissue tightness in bilat UT and cervical paraspinals though has improved overall.  Pt responded well to Rx having no c/o's after Rx.     OBJECTIVE IMPAIRMENTS decreased activity tolerance, decreased ROM, hypomobility, increased muscle spasms, impaired flexibility, postural dysfunction, and pain.   ACTIVITY LIMITATIONS lifting  PARTICIPATION LIMITATIONS:   PERSONAL FACTORS Chronic and 1-2 comorbidities: DM Type 2 and occasional LBP  are also affecting patient's functional outcome.   REHAB POTENTIAL: Good  CLINICAL DECISION MAKING: Evolving/moderate complexity  EVALUATION COMPLEXITY: Low   GOALS:  SHORT TERM GOALS: Target date: 05/11/2022  Pt will be independent and compliant with HEP for improved pain, ROM, function, and  posture.  Baseline:  Goal status: GOAL MET  2.  Pt will demo at least an 8 deg increase in R Sb'ing for improved ROM, stiffness, and flexibility.  Baseline: R SB:  32 deg / CURRENT:  45 deg Goal status: GOAL MET  3.  Pt will report at least a 25% improvement in pain and sx's overall.  Baseline:  Goal status:  GOAL MET   LONG TERM GOALS: Target date: 06/29/2022   Pt will report at least a 70% improvement in pain and sx's overall.  Baseline:  Goal status: GOAL MET  2.  Pt will demo improved posture  with reduced forward head and rounded shoulders for improved postural alignment and reduced stress on her cervical and thoracic spine for improved pain with daily activities.  Baseline:  Goal status: GOAL MET  3.  Pt will perform postural stabilization exercises without adverse effects for improved postural strength for reduced stress on spine and for improved performance of household chores.  Baseline:  Goal status: GOAL MET  4.  Pt will report having H/A's no > than 2 days/week. Baseline:  Goal status: met   PLAN: PT FREQUENCY: 2x/week  PT DURATION: other: 3 weeks  PLANNED INTERVENTIONS: Therapeutic exercises, Therapeutic activity, Neuromuscular re-education, Patient/Family education, Self Care, Joint mobilization, Aquatic Therapy, Dry Needling, Electrical stimulation, Cryotherapy, Moist heat, Taping, Ultrasound, Manual therapy, and Re-evaluation  PLAN FOR NEXT SESSION:  STW to bilat UT, cervical, and medial scap mm.  Postural strengthening and stabilization.  PN vs discharge next visit.  Selinda Michaels III PT, DPT 06/22/22 10:03 PM

## 2022-06-29 ENCOUNTER — Ambulatory Visit (HOSPITAL_BASED_OUTPATIENT_CLINIC_OR_DEPARTMENT_OTHER): Payer: Medicare Other | Admitting: Physical Therapy

## 2022-06-29 ENCOUNTER — Encounter (HOSPITAL_BASED_OUTPATIENT_CLINIC_OR_DEPARTMENT_OTHER): Payer: Self-pay | Admitting: Physical Therapy

## 2022-06-29 DIAGNOSIS — R293 Abnormal posture: Secondary | ICD-10-CM

## 2022-06-29 DIAGNOSIS — M62838 Other muscle spasm: Secondary | ICD-10-CM

## 2022-06-29 DIAGNOSIS — M542 Cervicalgia: Secondary | ICD-10-CM | POA: Diagnosis not present

## 2022-06-29 DIAGNOSIS — M546 Pain in thoracic spine: Secondary | ICD-10-CM

## 2022-06-30 NOTE — Therapy (Signed)
OUTPATIENT PHYSICAL THERAPY TREATMENT NOTE / DISCHARGE SUMMARY  Progress Note Reporting Period 06/08/22 to 1127/2023  See note below for Objective Data and Assessment of Progress/Goals.       Patient Name: Jeanne Shaw MRN: [id number] DOB:March 04, 1956, 66 y.o., female Today's Date: 06/30/2022      PT End of Session - 06/29/22 1309     Visit Number 14    Number of Visits 14    Authorization Type MCR part A and B    PT Start Time 3507    PT Stop Time 1346    PT Time Calculation (min) 41 min    Activity Tolerance Patient tolerated treatment well    Behavior During Therapy [facility] for tasks assessed/performed                   Past Medical History:  Diagnosis Date   Allergy    Anxiety    Asthma    Cataract    OU   Diabetes mellitus without complication ([place])    GERD (gastroesophageal reflux disease)    Hypertension    Macular degeneration    Dry OU   Subacute vaginitis 04/25/2021   Past Surgical History:  Procedure Laterality Date   BIOPSY  03/26/2020   Procedure: BIOPSY;  Surgeon: Harvel Quale, MD;  [facility];  Service: Gastroenterology;;   COLONOSCOPY WITH PROPOFOL N/A 03/26/2020   Procedure: COLONOSCOPY WITH PROPOFOL;  Surgeon: Harvel Quale, MD;  [facility];  Service: Gastroenterology;  Laterality: N/A;  815   CYST EXCISION     off vocal cord    POLYPECTOMY  03/26/2020   Procedure: POLYPECTOMY;  Surgeon: Harvel Quale, MD;  [facility];  Service: Gastroenterology;;   Patient Active Problem List   Diagnosis Date Noted   Fallen bladder 04/08/2022   Neck and shoulder pain 04/08/2022   Cervicogenic headache 04/08/2022   Elevated LFTs 11/24/2021   Gastroesophageal reflux disease 11/24/2021   Acute swimmer's ear of right side 11/24/2021   Allergies 11/24/2021   Cataract of both eyes 08/25/2021   Encounter for medical examination to establish care 04/25/2021   Leg pain,  posterior, left 04/25/2021   Obesity (BMI 30.0-34.9) 09/02/2018   Hyperlipidemia 02/02/2018   DM (diabetes mellitus), type 2 with complications ([place]) 57/32/2567   Essential hypertension 08/02/2017   Rosacea 08/02/2017     REFERRING PROVIDER: [name], NP   REFERRING DIAG: M54.2,M25.519 (ICD-10-CM) - Neck and shoulder pain G44.86 (ICD-10-CM) - Cervicogenic headache   THERAPY DIAG:  Cervicalgia  Pain in thoracic spine  Other muscle spasm  Abnormal posture  Rationale for Evaluation and Treatment Rehabilitation  ONSET DATE: MD order 02/19/2022  SUBJECTIVE:  SUBJECTIVE STATEMENT: Pt is doing well overall.  Pt reports compliance with HEP.  Pt received the theracane and has been using it at home.  Pt states she was sore after prior Rx though had no increased pain.  Pt reports 90-95% improvement in pain and sx's overall.  Pt does feel the tightness with certain positioning including putting a jigsaw puzzle together.  Pt denies pain currently, just a little irritation.  Worst pain:  3/10.    PERTINENT HISTORY:  Chronic pain DM Type 2, Macular degeneration, vertigo, occasional LBP   PRECAUTIONS: Other: chronic pain  WEIGHT BEARING RESTRICTIONS No  FALLS:  Has patient fallen in last 6 months? No  OCCUPATION: Pt is retired  PLOF: Independent  PATIENT GOALS decrease pain level consistently to 0-1/10  OBJECTIVE:   DIAGNOSTIC FINDINGS:  CT in March 2023 for post neck mass:   IMPRESSION: Palpable mass likely reflects a lipoma  TODAY'S TREATMENT:    Manual Therapy: -Pt received IASTM to bilat UT and superomedial scapula seated to improve soft tissue tightness and pain and reduce myofascial restrictions and adhesions.  -Pt received supine STM to bilat cervical paraspinals  and suboccipitals to improve soft tissue tightness and pain.    Therapeutic Exercise:   -Reviewed current function, response to prior Rx, HEP compliance, and pain level. -Reviewed HEP.  Gave pt an updated HEP handout.  Educated pt in correct form and appropriate frequency of exercises. -Open book stretch in S/L 10x bilat  -Doorway Pec stretch single arm 2x30 sec bilat -Reviewed goals with pt and educated pt concerning progress and objective findings  Cervical AROM: SB:  R/L:  45/44 deg Rotation:  WFL      PATIENT EDUCATION:  Education details:  Objective findings, HEP, and POC.  Person educated: Patient Education method: Explanation, Demonstration, and Verbal cues Education comprehension: verbalized understanding, returned demonstration, verbal cues required   HOME EXERCISE PROGRAM: Access Code: [email or web address] URL: [email or web address] Date: 04/30/2022 Prepared by: Ronny Flurry   ASSESSMENT:  CLINICAL IMPRESSION: Pt has made excellent progress in PT.  Pt has significantly improved tightness and has reduced H/A's.  Pt reports 90-95% improvement in pain and sx's overall.  PT reviewed HEP and pt is independent with HEP.  Pt is compliant with HEP and now has a theracane to work on soft tissue tightness at home.  Pt demonstrates improved and good cervical Sb'ing AROM.  Pt has met all goals and is ready for discharge.   OBJECTIVE IMPAIRMENTS decreased activity tolerance, decreased ROM, hypomobility, increased muscle spasms, impaired flexibility, postural dysfunction, and pain.   ACTIVITY LIMITATIONS lifting  PARTICIPATION LIMITATIONS:   PERSONAL FACTORS Chronic and 1-2 comorbidities: DM Type 2 and occasional LBP  are also affecting patient's functional outcome.   REHAB POTENTIAL: Good  CLINICAL DECISION MAKING: Evolving/moderate complexity  EVALUATION COMPLEXITY: Low   GOALS:  SHORT TERM GOALS: Target date: 05/11/2022  Pt will be independent and compliant with  HEP for improved pain, ROM, function, and posture.  Baseline:  Goal status: GOAL MET  2.  Pt will demo at least an 8 deg increase in R Sb'ing for improved ROM, stiffness, and flexibility.  Baseline: R SB:  32 deg / CURRENT:  45 deg Goal status: GOAL MET  3.  Pt will report at least a 25% improvement in pain and sx's overall.  Baseline:  Goal status:  GOAL MET   LONG TERM GOALS: Target date: 06/29/2022   Pt will report at least a 70% improvement in pain and sx's  overall.  Baseline:  Goal status: GOAL MET  2.  Pt will demo improved posture with reduced forward head and rounded shoulders for improved postural alignment and reduced stress on her cervical and thoracic spine for improved pain with daily activities.  Baseline:  Goal status: GOAL MET  3.  Pt will perform postural stabilization exercises without adverse effects for improved postural strength for reduced stress on spine and for improved performance of household chores.  Baseline:  Goal status: GOAL MET  4.  Pt will report having H/A's no > than 2 days/week. Baseline:  Goal status: met   PLAN: PT FREQUENCY: 2x/week  PT DURATION: other: 3 weeks  PLANNED INTERVENTIONS: Therapeutic exercises, Therapeutic activity, Neuromuscular re-education, Patient/Family education, Self Care, Joint mobilization, Aquatic Therapy, Dry Needling, Electrical stimulation, Cryotherapy, Moist heat, Taping, Ultrasound, Manual therapy, and Re-evaluation  PLAN FOR NEXT SESSION:   Pt to be discharged due to meeting all goals.  Pt is agreeable with discharge.  She will cont with HEP and using theracane.    PHYSICAL THERAPY DISCHARGE SUMMARY  Visits from Start of Care: 14  Current functional level related to goals / functional outcomes: See above   Remaining deficits: See above    Education / Equipment: See above     Selinda Michaels III PT, DPT 06/30/22 9:45 PM

## 2022-07-01 ENCOUNTER — Encounter (HOSPITAL_BASED_OUTPATIENT_CLINIC_OR_DEPARTMENT_OTHER): Payer: Medicare Other | Admitting: Physical Therapy

## 2022-07-02 ENCOUNTER — Other Ambulatory Visit: Payer: Medicare Other

## 2022-07-02 ENCOUNTER — Ambulatory Visit
Admission: RE | Admit: 2022-07-02 | Discharge: 2022-07-02 | Disposition: A | Discharge status: Home / Self Care | Payer: Medicare Other | Source: Ambulatory Visit | Attending: Nurse Practitioner | Admitting: Nurse Practitioner

## 2022-07-02 DIAGNOSIS — Z1231 Encounter for screening mammogram for malignant neoplasm of breast: Secondary | ICD-10-CM

## 2022-07-06 ENCOUNTER — Encounter (HOSPITAL_BASED_OUTPATIENT_CLINIC_OR_DEPARTMENT_OTHER): Payer: Medicare Other | Admitting: Physical Therapy

## 2022-07-10 ENCOUNTER — Encounter (HOSPITAL_BASED_OUTPATIENT_CLINIC_OR_DEPARTMENT_OTHER): Payer: Medicare Other | Admitting: Physical Therapy

## 2022-08-07 ENCOUNTER — Encounter: Payer: Self-pay | Admitting: Nurse Practitioner

## 2022-08-10 ENCOUNTER — Other Ambulatory Visit: Payer: Self-pay | Admitting: Nurse Practitioner

## 2022-08-10 DIAGNOSIS — R300 Dysuria: Secondary | ICD-10-CM

## 2022-08-10 MED ORDER — SULFAMETHOXAZOLE-TRIMETHOPRIM 800-160 MG PO TABS: 1.0000 | ORAL_TABLET | Freq: Two times a day (BID) | ORAL | 0 refills | Status: DC

## 2022-08-18 ENCOUNTER — Ambulatory Visit
Admission: RE | Admit: 2022-08-18 | Discharge: 2022-08-18 | Disposition: A | Discharge status: Home / Self Care | Payer: Medicare Other | Source: Ambulatory Visit | Attending: Nurse Practitioner | Admitting: Nurse Practitioner

## 2022-08-18 DIAGNOSIS — E2839 Other primary ovarian failure: Secondary | ICD-10-CM

## 2022-08-18 DIAGNOSIS — Z13820 Encounter for screening for osteoporosis: Secondary | ICD-10-CM

## 2022-08-19 ENCOUNTER — Other Ambulatory Visit: Payer: Self-pay | Admitting: Nurse Practitioner

## 2022-08-19 ENCOUNTER — Encounter: Payer: Self-pay | Admitting: Nurse Practitioner

## 2022-08-19 DIAGNOSIS — M8580 Other specified disorders of bone density and structure, unspecified site: Secondary | ICD-10-CM | POA: Insufficient documentation

## 2022-08-19 DIAGNOSIS — M8589 Other specified disorders of bone density and structure, multiple sites: Secondary | ICD-10-CM

## 2022-08-25 ENCOUNTER — Encounter: Payer: Self-pay | Admitting: Nurse Practitioner

## 2022-08-25 ENCOUNTER — Ambulatory Visit (INDEPENDENT_AMBULATORY_CARE_PROVIDER_SITE_OTHER): Payer: Medicare Other | Admitting: Nurse Practitioner

## 2022-08-25 VITALS — BP 128/80 | HR 88 | Temp 98.3°F | Wt 172.4 lb

## 2022-08-25 DIAGNOSIS — E782 Mixed hyperlipidemia: Secondary | ICD-10-CM | POA: Diagnosis not present

## 2022-08-25 DIAGNOSIS — E669 Obesity, unspecified: Secondary | ICD-10-CM

## 2022-08-25 DIAGNOSIS — E1169 Type 2 diabetes mellitus with other specified complication: Secondary | ICD-10-CM

## 2022-08-25 DIAGNOSIS — D691 Qualitative platelet defects: Secondary | ICD-10-CM | POA: Diagnosis not present

## 2022-08-25 DIAGNOSIS — I10 Essential (primary) hypertension: Secondary | ICD-10-CM

## 2022-08-25 DIAGNOSIS — I152 Hypertension secondary to endocrine disorders: Secondary | ICD-10-CM

## 2022-08-25 DIAGNOSIS — E66811 Obesity, class 1: Secondary | ICD-10-CM

## 2022-08-25 DIAGNOSIS — E1159 Type 2 diabetes mellitus with other circulatory complications: Secondary | ICD-10-CM

## 2022-08-25 DIAGNOSIS — E118 Type 2 diabetes mellitus with unspecified complications: Secondary | ICD-10-CM | POA: Diagnosis not present

## 2022-08-25 DIAGNOSIS — R062 Wheezing: Secondary | ICD-10-CM

## 2022-08-25 DIAGNOSIS — E785 Hyperlipidemia, unspecified: Secondary | ICD-10-CM

## 2022-08-25 DIAGNOSIS — K219 Gastro-esophageal reflux disease without esophagitis: Secondary | ICD-10-CM

## 2022-08-25 DIAGNOSIS — R7989 Other specified abnormal findings of blood chemistry: Secondary | ICD-10-CM

## 2022-08-25 HISTORY — DX: Wheezing: R06.2

## 2022-08-25 LAB — LIPID PANEL

## 2022-08-25 MED ORDER — FAMOTIDINE 40 MG PO TABS: 40.0000 mg | ORAL_TABLET | Freq: Every day | ORAL | 3 refills | Status: DC

## 2022-08-25 MED ORDER — ALBUTEROL SULFATE HFA 108 (90 BASE) MCG/ACT IN AERS: 1.0000 | INHALATION_SPRAY | Freq: Four times a day (QID) | RESPIRATORY_TRACT | 0 refills | Status: DC | PRN

## 2022-08-25 MED ORDER — SEMAGLUTIDE (1 MG/DOSE) 4 MG/3ML ~~LOC~~ SOPN: 1.0000 mg | PEN_INJECTOR | SUBCUTANEOUS | 2 refills | Status: DC

## 2022-08-25 NOTE — Assessment & Plan Note (Signed)
Chronic. Will repeat labs today. No alarm sx present at this time.

## 2022-08-25 NOTE — Assessment & Plan Note (Signed)
Chronic. Currently on Ozempic for BG control and working on weight loss with diet and exercise. At this time she has not reached therapeutic dose with Ozempic. We will increase to '1mg'$  today and plan to increase further in 4 weeks if her BG is not controlled. Continue to work on exercise.

## 2022-08-25 NOTE — Assessment & Plan Note (Signed)
Chronic. She reports memory concerns and read that this could be a side effect of PPI medication. We will plan to stop PPI and start famotidine to see if this helps with her memory while also controlling her GERD. She will let me know if this is not effective.

## 2022-08-25 NOTE — Assessment & Plan Note (Signed)
Chronic. Currently on statin therapy and tolerating well. No alarm symptoms present. Labs pending today.Recommend continue current regimen. Will make changes to the plan of care as necessary based on findings.

## 2022-08-25 NOTE — Assessment & Plan Note (Signed)
Wheezing present on auscultation with breathing changes noted as the weather turned colder. I do suspect she may have a component of asthma present- I do not have her records for previous pulmonary testing, but we can repeat if she continues to have issues. Will start albuterol for now and monitor closely.

## 2022-08-25 NOTE — Assessment & Plan Note (Signed)
Chronic. Well controlled today with no alarm symptoms present. Labs pending. Continue current treatment plan. Will make changes as necessary based on labs

## 2022-08-25 NOTE — Progress Notes (Signed)
Jeanne Keeler, DNP, AGNP-c [place]  [address] [place], [place] [address] [place] and/or Follow-Up Visit  Blood pressure 128/80, pulse 88, temperature 98.3 F (36.8 C), weight 172 lb 6.4 oz (78.2 kg).    Jeanne Shaw is a 67 y.o. year old female presenting today for evaluation and management of the following: DM/HLD/HTN She tells me overall she has been doing well. She tells me she feels that she is more hungry than usual for the last couple of months, even though she is on the [place]. She is not having any side effects of the medication. Fasting blood sugars in the mornings have been about 181, but she tells me she did not eat well yesterday. She has been working to cut back on her wine intake.   Wheezing She tells me that at night she notices that her breathing is a little harder and she feels a little stuffy. She did have a pulmonary evaluation several years ago that initially diagnosed her with asthma, but she thinks later was changed to emphysema. She is not on any chronic medications for this.   All ROS negative with exception of what is listed above.   PHYSICAL EXAM Physical Exam Vitals and nursing note reviewed.  Constitutional:      Appearance: Normal appearance. She is obese.  HENT:     Head: Normocephalic.  Eyes:     Pupils: Pupils are equal, round, and reactive to light.  Neck:     Vascular: No carotid bruit.  Cardiovascular:     Rate and Rhythm: Normal rate and regular rhythm.     Pulses: Normal pulses.     Heart sounds: Normal heart sounds.  Pulmonary:     Effort: Pulmonary effort is normal.     Breath sounds: Wheezing present.     Comments: Left lower lobe Abdominal:     General: Bowel sounds are normal. There is no distension.     Palpations: Abdomen is soft.     Tenderness: There is no abdominal tenderness. There is no guarding.  Musculoskeletal:     Cervical back: Normal  range of motion.     Right lower leg: No edema.     Left lower leg: No edema.  Skin:    General: Skin is warm and dry.     Capillary Refill: Capillary refill takes less than 2 seconds.  Neurological:     General: No focal deficit present.     Mental Status: She is alert and oriented to person, place, and time.  Psychiatric:        Mood and Affect: Mood normal.        Behavior: Behavior normal.     PLAN Problem List Items Addressed This Visit     DM (diabetes mellitus), type 2 with complications ([place])    Chronic. Her control is not as good as it once was and her hunger has increased. She is still only on the 0.'5mg'$  dose, so it is likely she has not reached a therapeutic level at this point. We will increase to the '1mg'$  dose and monitor blood sugars closely. We will likely need to increase to the next dose in 4 weeks, but she will monitor her BG and let me know if her fasting levels are staying high.       Relevant Medications   Semaglutide, 1 MG/DOSE, 4 MG/3ML SOPN   Other Relevant Orders   CBC with Differential/Platelet   Comprehensive metabolic panel  Hemoglobin A1c   Lipid panel   Hypertension associated with type 2 diabetes mellitus (HCC)    Chronic. Well controlled today with no alarm symptoms present. Labs pending. Continue current treatment plan. Will make changes as necessary based on labs       Relevant Medications   Semaglutide, 1 MG/DOSE, 4 MG/3ML SOPN   Hyperlipidemia associated with type 2 diabetes mellitus (HCC)    Chronic. Currently on statin therapy and tolerating well. No alarm symptoms present. Labs pending today.Recommend continue current regimen. Will make changes to the plan of care as necessary based on findings.       Relevant Medications   Semaglutide, 1 MG/DOSE, 4 MG/3ML SOPN   Obesity (BMI 30.0-34.9)    Chronic. Currently on Ozempic for BG control and working on weight loss with diet and exercise. At this time she has not reached therapeutic dose with  Ozempic. We will increase to '1mg'$  today and plan to increase further in 4 weeks if her BG is not controlled. Continue to work on exercise.       Relevant Medications   Semaglutide, 1 MG/DOSE, 4 MG/3ML SOPN   Other Relevant Orders   Comprehensive metabolic panel   Hemoglobin A1c   Lipid panel   Elevated LFTs    Chronic. History of fatty liver. Will repeat labs today. Working on diet and weight loss.       Relevant Medications   Semaglutide, 1 MG/DOSE, 4 MG/3ML SOPN   Other Relevant Orders   Comprehensive metabolic panel   Hemoglobin A1c   Lipid panel   Gastroesophageal reflux disease    Chronic. She reports memory concerns and read that this could be a side effect of PPI medication. We will plan to stop PPI and start famotidine to see if this helps with her memory while also controlling her GERD. She will let me know if this is not effective.       Relevant Medications   famotidine (PEPCID) 40 MG tablet   Abnormal platelets (HCC) - Primary    Chronic. Will repeat labs today. No alarm sx present at this time.       Relevant Orders   CBC with Differential/Platelet   Wheezing    Wheezing present on auscultation with breathing changes noted as the weather turned colder. I do suspect she may have a component of asthma present- I do not have her records for previous pulmonary testing, but we can repeat if she continues to have issues. Will start albuterol for now and monitor closely.       Relevant Medications   albuterol (VENTOLIN HFA) 108 (90 Base) MCG/ACT inhaler    Return in about 3 months (around 11/24/2022) for Med management.  Time: 50 minutes, >50% spent counseling, care coordination, chart review, and documentation.     Jeanne Keeler, DNP, AGNP-c 08/25/2022  8:36 AM

## 2022-08-25 NOTE — Assessment & Plan Note (Signed)
Chronic. History of fatty liver. Will repeat labs today. Working on diet and weight loss.

## 2022-08-25 NOTE — Patient Instructions (Addendum)
Keep checking your blood sugar in the morning. If after 4 weeks your morning blood sugars are not less than 140, please let me know and we can increase the dose to the next step up.   Your blood pressure looks good today!  Keep working on [employer] and exercise.   I recommend taking at least Vitamin D3 1000iU daily and Calcium '1500mg'$  daily to help with the osteopenia.   We will stop the omeprazole and start famotidine once a day. If there are days when your symptoms seem to be worse, you can take a dose of the omperazole to help combat the break through symptoms.   I have sent in albuterol for the wheezing and breathing changes you have noticed at night. If you notice a difference with this this, please let me know

## 2022-08-25 NOTE — Assessment & Plan Note (Signed)
Chronic. Her control is not as good as it once was and her hunger has increased. She is still only on the 0.'5mg'$  dose, so it is likely she has not reached a therapeutic level at this point. We will increase to the '1mg'$  dose and monitor blood sugars closely. We will likely need to increase to the next dose in 4 weeks, but she will monitor her BG and let me know if her fasting levels are staying high.

## 2022-08-26 LAB — COMPREHENSIVE METABOLIC PANEL
ALT: 83 IU/L — ABNORMAL HIGH (ref 0–32)
AST: 46 IU/L — ABNORMAL HIGH (ref 0–40)
Albumin/Globulin Ratio: 1.9 (ref 1.2–2.2)
Albumin: 4.2 g/dL (ref 3.9–4.9)
Alkaline Phosphatase: 128 IU/L — ABNORMAL HIGH (ref 44–121)
BUN/Creatinine Ratio: 29 — ABNORMAL HIGH (ref 12–28)
BUN: 16 mg/dL (ref 8–27)
Bilirubin Total: <0.2 mg/dL (ref 0.0–1.2)
CO2: 21 mmol/L (ref 20–29)
Calcium: 9.8 mg/dL (ref 8.7–10.3)
Chloride: 101 mmol/L (ref 96–106)
Creatinine, Ser: 0.55 mg/dL — ABNORMAL LOW (ref 0.57–1.00)
Globulin, Total: 2.2 g/dL (ref 1.5–4.5)
Glucose: 182 mg/dL — ABNORMAL HIGH (ref 70–99)
Potassium: 4.9 mmol/L (ref 3.5–5.2)
Sodium: 138 mmol/L (ref 134–144)
Total Protein: 6.4 g/dL (ref 6.0–8.5)
eGFR: 100 mL/min/{1.73_m2} (ref >59)

## 2022-08-26 LAB — CBC WITH DIFFERENTIAL/PLATELET
Basophils Absolute: 0.1 10*3/uL (ref 0.0–0.2)
Basos: 1 %
EOS (ABSOLUTE): 0.4 10*3/uL (ref 0.0–0.4)
Eos: 4 %
Hematocrit: 42.4 % (ref 34.0–46.6)
Hemoglobin: 14.3 g/dL (ref 11.1–15.9)
Immature Grans (Abs): 0 10*3/uL (ref 0.0–0.1)
Immature Granulocytes: 0 %
Lymphocytes Absolute: 3 10*3/uL (ref 0.7–3.1)
Lymphs: 28 %
MCH: 31.4 pg (ref 26.6–33.0)
MCHC: 33.7 g/dL (ref 31.5–35.7)
MCV: 93 fL (ref 79–97)
Monocytes Absolute: 0.4 10*3/uL (ref 0.1–0.9)
Monocytes: 4 %
Neutrophils Absolute: 6.8 10*3/uL (ref 1.4–7.0)
Neutrophils: 63 %
Platelets: 577 10*3/uL — ABNORMAL HIGH (ref 150–450)
RBC: 4.56 x10E6/uL (ref 3.77–5.28)
RDW: 12 % (ref 11.7–15.4)
WBC: 10.8 10*3/uL (ref 3.4–10.8)

## 2022-08-26 LAB — LIPID PANEL
Chol/HDL Ratio: 3.5 ratio (ref 0.0–4.4)
Cholesterol, Total: 112 mg/dL (ref 100–199)
HDL: 32 mg/dL — ABNORMAL LOW (ref >39)
LDL Chol Calc (NIH): 53 mg/dL (ref 0–99)
Triglycerides: 157 mg/dL — ABNORMAL HIGH (ref 0–149)
VLDL Cholesterol Cal: 27 mg/dL (ref 5–40)

## 2022-08-26 LAB — HEMOGLOBIN A1C
Est. average glucose Bld gHb Est-mCnc: 200 mg/dL
Hgb A1c MFr Bld: 8.6 % — ABNORMAL HIGH (ref 4.8–5.6)

## 2022-09-02 ENCOUNTER — Encounter: Payer: Self-pay | Admitting: Nurse Practitioner

## 2022-09-04 ENCOUNTER — Encounter: Payer: Self-pay | Admitting: Nurse Practitioner

## 2022-09-10 IMAGING — CT CT NECK W/ CM
3 of 4 series · 14 of 33 positions shown, 17 images · IV contrast (Omnipaque or Isovue)
Comparison: None.

CLINICAL DATA: Posterior neck mass, nonspecific ultrasound

EXAM:
CT NECK WITH CONTRAST
TECHNIQUE: Multidetector CT imaging of the neck was performed using the
standard protocol following the bolus administration of intravenous
contrast.
CONTRAST:  75mL OMNIPAQUE IOHEXOL 300 MG/ML  SOLN

[Series 4: cor neck · coronal · 0.35mm/px · 3 of 109 slices shown]
[im 22/109  bone]
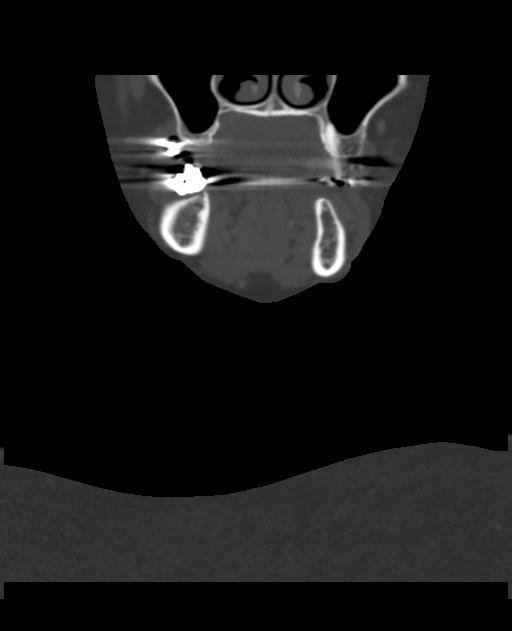
[im 44/109  bone]
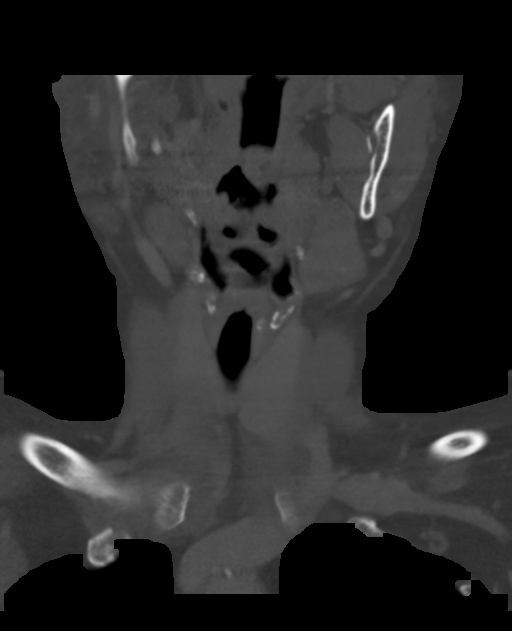
[im 65/109  bone]
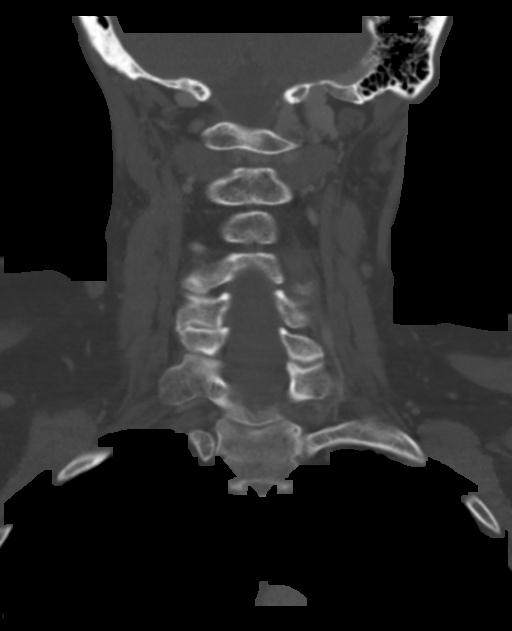

[Series 5: sag neck · sagittal · 0.44mm/px · 5 of 101 slices shown, 6 images]
[im 34/101  bone]
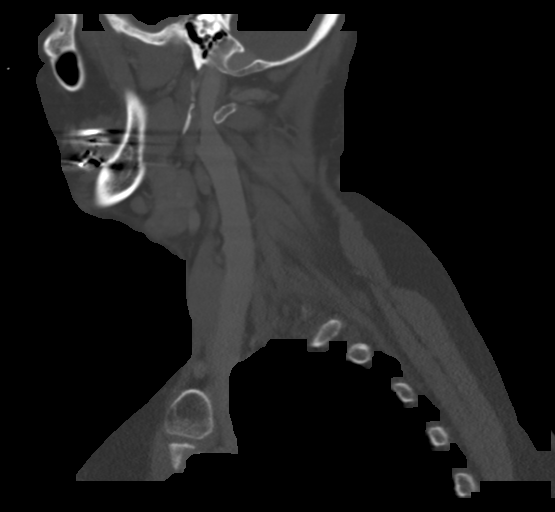
[im 42/101  bone]
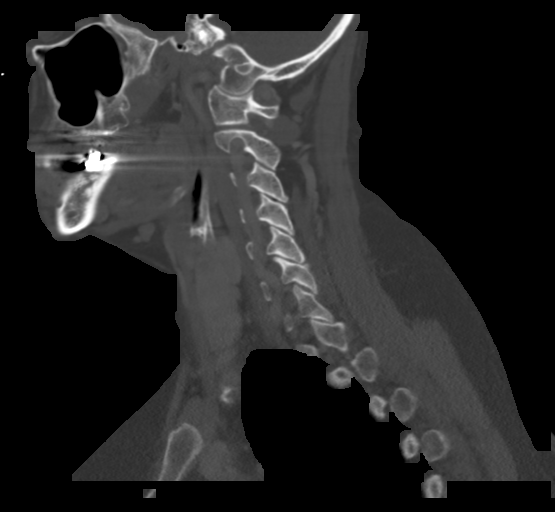
[im 51/101  soft-tissue]
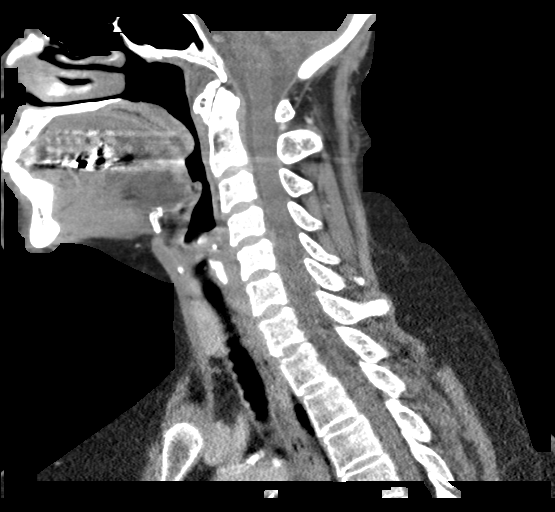
[im 51/101  bone]
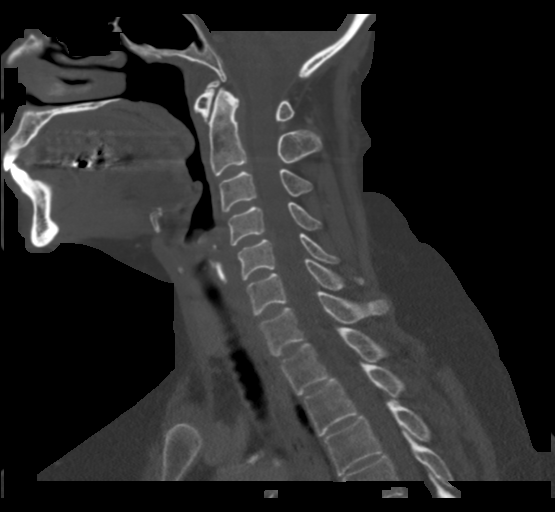
[im 59/101  bone]
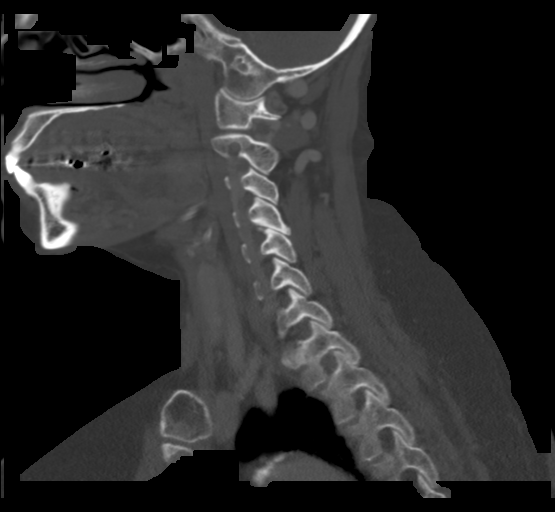
[im 67/101  bone]
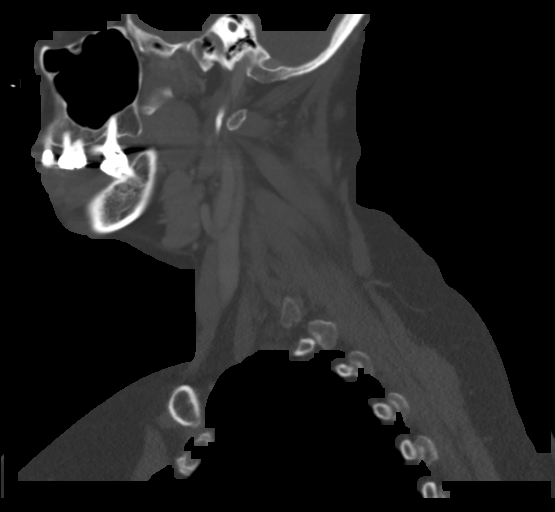

[Series 6: ax oropharynx · axial · 0.39mm/px · z∈[+1207,+1380]mm · 6 of 124 slices shown, 8 images]
[im 18/124  soft-tissue]
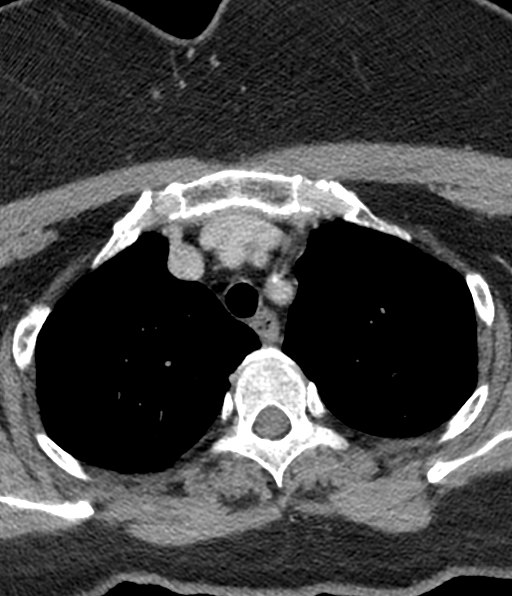
[im 18/124  bone]
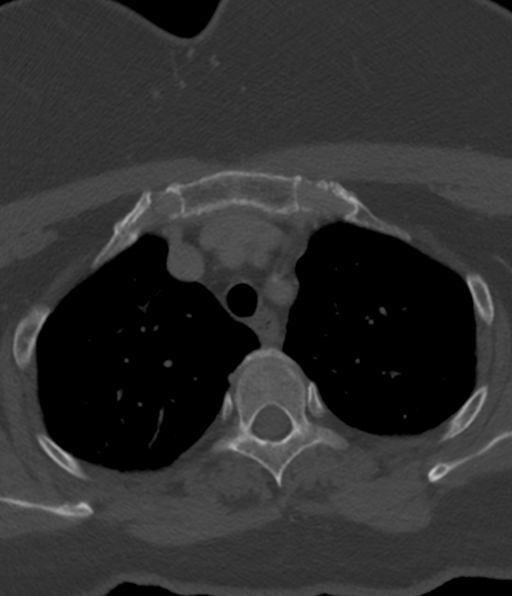
[im 36/124  bone]
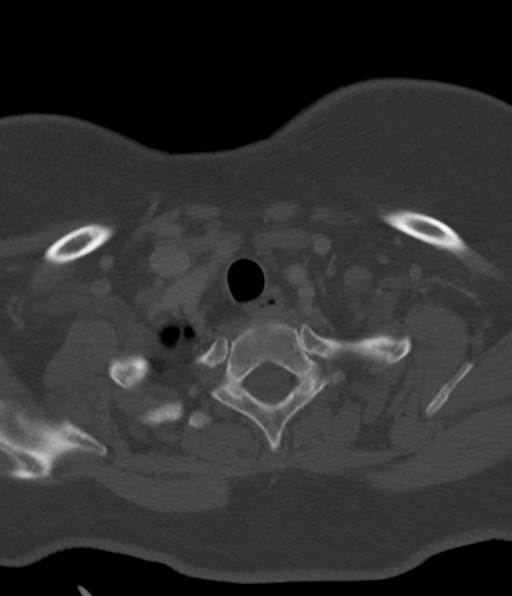
[im 53/124  bone]
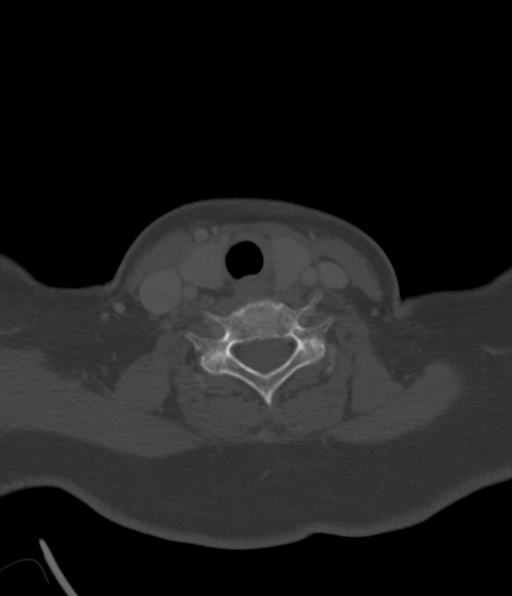
[im 71/124  bone]
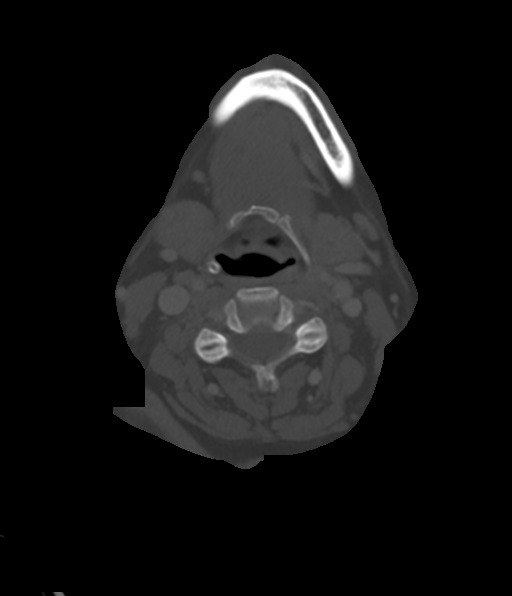
[im 88/124  soft-tissue]
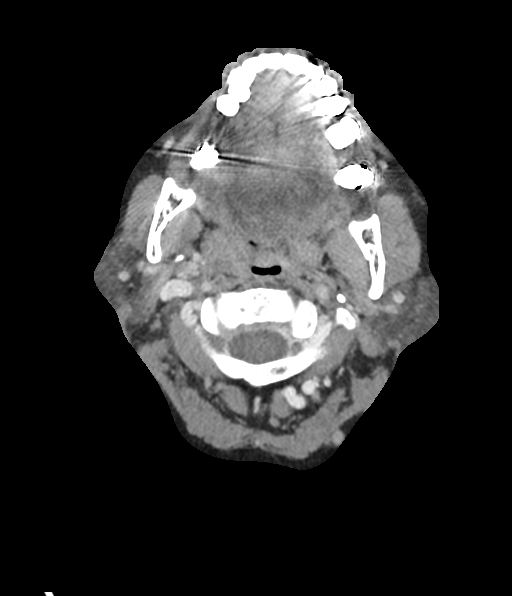
[im 88/124  bone]
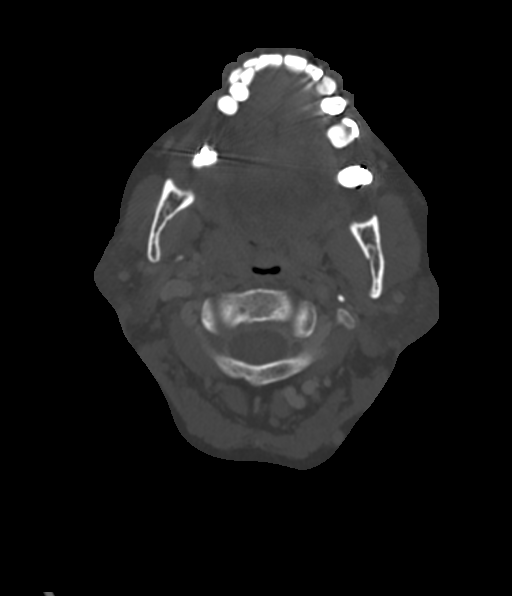
[im 106/124  bone]
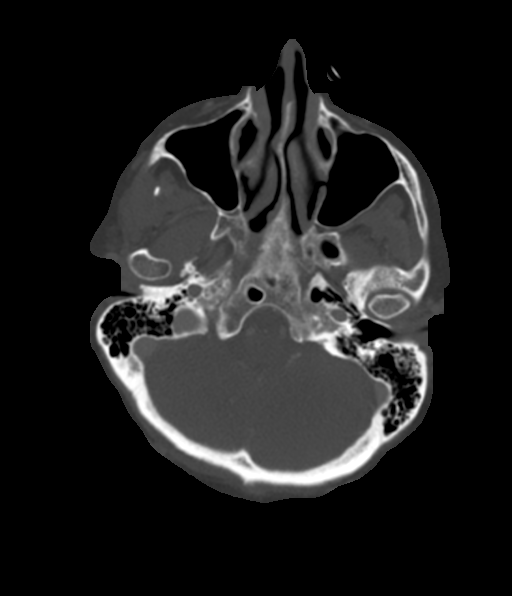

[14 of 33 positions shown; findings below may reference images not displayed]

FINDINGS: Pharynx and larynx: Unremarkable.  No mass or swelling.

Salivary glands: Submandibular glands are unremarkable. Small
calculus of the left parotid. Right parotid is unremarkable.

Thyroid: Normal.

Lymph nodes: No enlarged or abnormal density nodes.

Vascular: Major neck vessels are patent. Mild plaque at the ICA
origins.

Limited intracranial: No abnormal enhancement.

Visualized orbits: Unremarkable.

Mastoids and visualized paranasal sinuses: No significant
opacification.

Skeleton: No significant abnormality.

Upper chest: Emphysema. 5 mm ground-glass opacity at the right apex
(series 2, image 83).

Other: Skin marker overlies the left parasagittal posterior neck at
the C2-C3 level. Appears to be slightly asymmetric fat density in
this region suggesting presence of a lipoma.
IMPRESSION: Palpable mass likely reflects a lipoma.

Emphysema. 5 mm right lung apex ground-glass opacity. No follow-up
recommended. This recommendation follows the consensus statement:
Guidelines for Management of Incidental Pulmonary Nodules Detected

## 2022-09-18 NOTE — Progress Notes (Signed)
[facility] Note  09/23/2022     CHIEF COMPLAINT Patient presents for Retina Follow Up  HISTORY OF PRESENT ILLNESS: Jeanne Shaw is a 67 y.o. female who presents to the clinic today for:   HPI     Retina Follow Up   Patient presents with  Dry AMD.  In both eyes.  This started 6 months ago.  I, the attending physician,  performed the HPI with the patient and updated documentation appropriately.        Comments   Patient here for 6 months retina follow up for ARMD OU. Patient states vision doing fairly well. Had cataract surgery OU. Still needs readers. Uses systane prn.      Last edited by Bernarda Caffey, MD on 09/23/2022  8:44 AM.    pt had cataract sx with Dr. Katy Fitch in August and September 2023, she states her vision improved afterwards, she is not having any problems with her vision, pt is not on any drops except Systane, she denies fol or new floaters  Referring physician: [name], NP [place],  [place] [id number]  HISTORICAL INFORMATION:   Selected notes from the [facility] Referred by M. Dixon, PA-C for DM exam Ocular Hx-  PMH- DM (A1C 7.4 on 04.01.19), HTN, asthma,     CURRENT MEDICATIONS: Current Outpatient Medications (Ophthalmic Drugs)  Medication Sig   Polyethyl Glycol-Propyl Glycol (SYSTANE) 0.4-0.3 % SOLN Apply 1 drop to eye daily.   No current facility-administered medications for this visit. (Ophthalmic Drugs)   Current Outpatient Medications (Other)  Medication Sig   albuterol (VENTOLIN HFA) 108 (90 Base) MCG/ACT inhaler Inhale 1-2 puffs into the lungs every 6 (six) hours as needed for wheezing or shortness of breath.   Ascorbic Acid (VITAMIN C) 1000 MG tablet Take 1,000 mg by mouth daily.   aspirin EC 81 MG tablet Take 81 mg by mouth daily.   Cholecalciferol (VITAMIN D) 125 MCG (5000 UT) CAPS Take 5,000 Units by mouth daily.   estradiol (ESTRACE) 0.1 MG/GM vaginal cream Place  0.5g nightly for two weeks then twice a week after   famotidine (PEPCID) 40 MG tablet Take 1 tablet (40 mg total) by mouth daily.   fexofenadine (EQ ALLERGY RELIEF) 180 MG tablet TAKE 1 TABLET BY MOUTH ONCE DAILY FOR ALLERGIES   Flaxseed, Linseed, (FLAX SEEDS PO) Take 1 tablet by mouth daily.    lisinopril (ZESTRIL) 10 MG tablet Take 1 tablet (10 mg total) by mouth daily.   metFORMIN (GLUCOPHAGE) 1000 MG tablet Take 1 tablet (1,000 mg total) by mouth 2 (two) times daily with a meal. TAKE 1 TABLET BY MOUTH TWICE DAILY WITH A MEAL   MILK THISTLE PO Take 1 tablet by mouth at bedtime.    Multiple Vitamins-Minerals (PRESERVISION AREDS) CAPS Take 1 capsule by mouth at bedtime.    Omega-3 Fatty Acids (FISH OIL) 1000 MG CAPS Take 1 capsule (1,000 mg total) by mouth daily.   Red Yeast Rice Extract (RED YEAST RICE PO) Take 1 tablet by mouth daily.    Semaglutide, 1 MG/DOSE, 4 MG/3ML SOPN Inject 1 mg as directed once a week.   simvastatin (ZOCOR) 10 MG tablet Take 1 tablet (10 mg total) by mouth at bedtime.   sodium chloride (OCEAN) 0.65 % SOLN nasal spray Place 1 spray into both nostrils daily as needed for congestion.   Zinc 50 MG CAPS    No current facility-administered medications for this visit. (  Other)   REVIEW OF SYSTEMS: ROS   Positive for: Gastrointestinal, Skin, Endocrine, Eyes, Respiratory Negative for: Constitutional, Neurological, Genitourinary, Musculoskeletal, HENT, Cardiovascular, Psychiatric, Allergic/Imm, Heme/Lymph Last edited by Theodore Demark, COA on 09/23/2022  7:58 AM.     ALLERGIES Allergies  Allergen Reactions   [place]ilder [place] [Dapagliflozin] Other (See Comments)    Mycotic infections   Hydrocodone Anxiety   PAST MEDICAL HISTORY Past Medical History:  Diagnosis Date   Acute swimmer's ear of right side 11/24/2021   Allergy    Anxiety    Asthma    Cataract    OU   Diabetes mellitus without complication ([place])    GERD (gastroesophageal reflux disease)    Hypertension     Macular degeneration    Dry OU   Osteopenia    Rosacea    Subacute vaginitis 04/25/2021   Past Surgical History:  Procedure Laterality Date   BIOPSY  03/26/2020   Procedure: BIOPSY;  Surgeon: Harvel Quale, MD;  [facility];  Service: Gastroenterology;;   COLONOSCOPY WITH PROPOFOL N/A 03/26/2020   Procedure: COLONOSCOPY WITH PROPOFOL;  Surgeon: Harvel Quale, MD;  [facility];  Service: Gastroenterology;  Laterality: N/A;  815   CYST EXCISION     off vocal cord    POLYPECTOMY  03/26/2020   Procedure: POLYPECTOMY;  Surgeon: Montez Morita, Quillian Quince, MD;  [facility]: [facility];  Service: Gastroenterology;;    FAMILY HISTORY Family History  Problem Relation Age of Onset   COPD Mother    Alcohol abuse Father    Diabetes Father    Heart disease Father    Hyperlipidemia Father    Hypertension Father    Cancer Sister    Diabetes Sister    Hyperlipidemia Sister    Hypertension Sister    Breast cancer Sister 43   Hypertension Brother    Alcohol abuse Maternal Aunt    Alcohol abuse Maternal Uncle    Hypertension Maternal Uncle    Alcohol abuse Paternal Aunt    Cancer Paternal Aunt    Alcohol abuse Paternal Uncle    Cancer Paternal Uncle    Diabetes Maternal Grandmother    Stroke Maternal Grandmother    Heart disease Maternal Grandfather    SOCIAL HISTORY Social History   Tobacco Use   Smoking status: Former    Packs/day: 0.25    Years: 30.00    Total pack years: 7.50    Types: Cigarettes    Quit date: 08/03/2015    Years since quitting: 7.1   Smokeless tobacco: Former  Scientific laboratory technician Use: Never used  Substance Use Topics   Alcohol use: Yes    Alcohol/week: 12.0 standard drinks of alcohol    Types: 12 Glasses of wine per week    Comment: beer occassionally   Drug use: No       OPHTHALMIC EXAM:  Base Eye Exam     Visual Acuity (Snellen - Linear)       Right Left   Dist [place] 20/20 -2 20/25 -2   Dist ph  [place]  20/20 -1         Tonometry (Tonopen, 7:53 AM)       Right Left   Pressure 15 13         Pupils       Dark Light Shape React APD   Right 3 2 Round Brisk None   Left 3 2 Round Brisk None  Visual Fields (Counting fingers)       Left Right    Full Full         Extraocular Movement       Right Left    Full, Ortho Full, Ortho         Neuro/Psych     Oriented x3: Yes   Mood/Affect: Normal         Dilation     Both eyes: 1.0% Mydriacyl, 2.5% Phenylephrine @ 7:53 AM           Slit Lamp and Fundus Exam     Slit Lamp Exam       Right Left   Lids/Lashes Dermatochalasis - upper lid Dermatochalasis - upper lid   Conjunctiva/Sclera nasal and temporal pinguecula nasal and temporal pinguecula   Cornea well healed cataract wound, tear film debris well healed cataract wound, mild tear film debris   Anterior Chamber Deep and quiet Deep and quiet   Iris Round and dilated, No NVI Round and dilated, No NVI   Lens Toric PC IOL in good position with marks at 0400 and 1000 PC IOL in good position   Anterior Vitreous Mild syneresis Mild syneresis         Fundus Exam       Right Left   Disc Pink and Sharp Pink and Sharp, Compact   C/D Ratio 0.3 0.2   Macula flat, blunted foveal reflex, mild RPE mottling and clumping, focal druse/pigment clump superonasal to fovea, No heme or edema Flat, Blunted foveal reflex, drusen, mild RPE mottling, clumping and early atrophy, +PEDs, No heme or edema   Vessels attenuated, mild tortuosity, mild AV crossing changes, mild copper wiring attenuated, Tortuous, AV crossing changes, mild Copper wiring   Periphery Attached, focal peripheral drusen nasally, reticular degeneration, No heme Attached, patch of pavingstone at 0600, mild reticular degeneration, No heme           Refraction     Wearing Rx     Type: PAL            IMAGING AND PROCEDURES  Imaging and Procedures for 11/16/17  OCT, Retina - OU - Both  Eyes       Right Eye Quality was good. Central Foveal Thickness: 298. Progression has been stable. Findings include normal foveal contour, no IRF, no SRF, retinal drusen , outer retinal atrophy, vitreomacular adhesion (Mild progression of ellipsoid loss).   Left Eye Quality was good. Central Foveal Thickness: 301. Progression has worsened. Findings include normal foveal contour, no IRF, no SRF, retinal drusen , pigment epithelial detachment, outer retinal atrophy, vitreomacular adhesion (?mild progression of ORA / ellipsoid loss).   Notes *Images captured and stored on drive  Diagnosis / Impression: NFP, No IRF/SRF OU No DME OU Nonexudative ARMD OU OD: Mild progression of ellipsoid loss OS: ?mild progression of ORA/  ellipsoid loss  Clinical management:  See below  Abbreviations: NFP - Normal foveal profile. CME - cystoid macular edema. PED - pigment epithelial detachment. IRF - intraretinal fluid. SRF - subretinal fluid. EZ - ellipsoid zone. ERM - epiretinal membrane. ORA - outer retinal atrophy. ORT - outer retinal tubulation. SRHM - subretinal hyper-reflective material             ASSESSMENT/PLAN:    ICD-10-CM   1. Intermediate stage nonexudative age-related macular degeneration of both eyes  H35.3132 OCT, Retina - OU - Both Eyes    2. Diabetes mellitus type 2 without retinopathy ([place])  E11.9  3. Essential hypertension  I10     4. Hypertensive retinopathy of both eyes  H35.033     5. Pseudophakia, both eyes  Z96.1      1. Non-Exudative ARMD OU-   - Intermediate stage (OS > OD)  - scattered drusen and early ORA  - OCT shows mild progression of ellipsoid loss / ORA OU  - BCVA remains 20/20 OU  - continue amsler grid monitoring  - f/u 6-9 months for repeat DFE/OCT  2. Diabetes mellitus, type 2 without retinopathy  - last a1c 8.6 on 01.23.24  - The incidence, risk factors for progression, natural history and treatment options for diabetic retinopathy  were  discussed with patient.    - The need for close monitoring of blood glucose, blood pressure, and serum lipids, avoiding cigarette or any type of tobacco, and the need for long term follow up was also discussed with patient.  - monitor   3,4. Hypertensive retinopathy OU - discussed importance of tight BP control - monitor  5. Pseudophakia OU  - s/p CE/IOL (Dr. Katy Fitch: OD (toric): 09.14.23, OS: 08.31.23)  - IOLs in good position, doing well  - monitor  Ophthalmic Meds Ordered this visit:  No orders of the defined types were placed in this encounter.    Return for f/u 6-9 months, non-exu ARMD OU, DFE, OCT.  There are no Patient Instructions on file for this visit.   Explained the diagnoses, plan, and follow up with the patient and they expressed understanding.  Patient expressed understanding of the importance of proper follow up care.   This document serves as a record of services personally performed by Gardiner Sleeper, MD, PhD. It was created on their behalf by Orvan Falconer, an ophthalmic technician. The creation of this record is the provider's dictation and/or activities during the visit.    Electronically signed by: Orvan Falconer, OA, 09/23/22  8:48 AM  This document serves as a record of services personally performed by Gardiner Sleeper, MD, PhD. It was created on their behalf by San Jetty. Owens Shark, OA an ophthalmic technician. The creation of this record is the provider's dictation and/or activities during the visit.    Electronically signed by: San Jetty. Owens Shark, [place] 02.21.2024 8:48 AM  Gardiner Sleeper, M.D., Ph.D. Diseases & Surgery of the Retina and Vitreous [facility]  I have reviewed the above documentation for accuracy and completeness, and I agree with the above. Gardiner Sleeper, M.D., Ph.D. 09/23/22 8:48 AM  Abbreviations: M myopia (nearsighted); A astigmatism; H hyperopia (farsighted); P presbyopia; Mrx spectacle prescription;  CTL contact  lenses; OD right eye; OS left eye; OU both eyes  XT exotropia; ET esotropia; PEK punctate epithelial keratitis; PEE punctate epithelial erosions; DES dry eye syndrome; MGD meibomian gland dysfunction; ATs artificial tears; PFAT's preservative free artificial tears; [place] nuclear sclerotic cataract; PSC posterior subcapsular cataract; ERM epi-retinal membrane; PVD posterior vitreous detachment; RD retinal detachment; DM diabetes mellitus; DR diabetic retinopathy; NPDR non-proliferative diabetic retinopathy; PDR proliferative diabetic retinopathy; CSME clinically significant macular edema; DME diabetic macular edema; dbh dot blot hemorrhages; CWS cotton wool spot; POAG primary open angle glaucoma; C/D cup-to-disc ratio; HVF humphrey visual field; GVF goldmann visual field; OCT optical coherence tomography; IOP intraocular pressure; BRVO Branch retinal vein occlusion; CRVO central retinal vein occlusion; CRAO central retinal artery occlusion; BRAO branch retinal artery occlusion; RT retinal tear; SB scleral buckle; PPV pars plana vitrectomy; VH Vitreous hemorrhage; PRP panretinal laser photocoagulation; IVK intravitreal  kenalog; VMT vitreomacular traction; MH Macular hole;  NVD neovascularization of the disc; NVE neovascularization elsewhere; AREDS age related eye disease study; ARMD age related macular degeneration; POAG primary open angle glaucoma; EBMD epithelial/anterior basement membrane dystrophy; ACIOL anterior chamber intraocular lens; IOL intraocular lens; PCIOL posterior chamber intraocular lens; Phaco/IOL phacoemulsification with intraocular lens placement; [place] photorefractive keratectomy; LASIK laser assisted in situ keratomileusis; HTN hypertension; DM diabetes mellitus; COPD chronic obstructive pulmonary disease

## 2022-09-23 ENCOUNTER — Ambulatory Visit (INDEPENDENT_AMBULATORY_CARE_PROVIDER_SITE_OTHER): Payer: Medicare Other | Admitting: Ophthalmology

## 2022-09-23 ENCOUNTER — Encounter (INDEPENDENT_AMBULATORY_CARE_PROVIDER_SITE_OTHER): Payer: Self-pay | Admitting: Ophthalmology

## 2022-09-23 DIAGNOSIS — H353132 Nonexudative age-related macular degeneration, bilateral, intermediate dry stage: Secondary | ICD-10-CM | POA: Diagnosis not present

## 2022-09-23 DIAGNOSIS — H35033 Hypertensive retinopathy, bilateral: Secondary | ICD-10-CM

## 2022-09-23 DIAGNOSIS — E119 Type 2 diabetes mellitus without complications: Secondary | ICD-10-CM

## 2022-09-23 DIAGNOSIS — Z961 Presence of intraocular lens: Secondary | ICD-10-CM

## 2022-09-23 DIAGNOSIS — I10 Essential (primary) hypertension: Secondary | ICD-10-CM

## 2022-09-23 DIAGNOSIS — H25813 Combined forms of age-related cataract, bilateral: Secondary | ICD-10-CM

## 2022-10-22 ENCOUNTER — Telehealth: Payer: Self-pay | Admitting: Nurse Practitioner

## 2022-10-22 NOTE — Telephone Encounter (Signed)
[facility] to schedule their annual wellness visit. Appointment made for 11/17/22.  Barkley Boards AWV direct phone # [phone]

## 2022-11-02 ENCOUNTER — Encounter: Payer: Self-pay | Admitting: Nurse Practitioner

## 2022-11-13 ENCOUNTER — Encounter: Payer: Self-pay | Admitting: Nurse Practitioner

## 2022-11-13 ENCOUNTER — Ambulatory Visit (INDEPENDENT_AMBULATORY_CARE_PROVIDER_SITE_OTHER): Payer: Medicare Other | Admitting: Nurse Practitioner

## 2022-11-13 VITALS — BP 132/80 | HR 73 | Wt 167.0 lb

## 2022-11-13 DIAGNOSIS — H6992 Unspecified Eustachian tube disorder, left ear: Secondary | ICD-10-CM

## 2022-11-13 DIAGNOSIS — H93A2 Pulsatile tinnitus, left ear: Secondary | ICD-10-CM

## 2022-11-13 DIAGNOSIS — E785 Hyperlipidemia, unspecified: Secondary | ICD-10-CM

## 2022-11-13 DIAGNOSIS — E118 Type 2 diabetes mellitus with unspecified complications: Secondary | ICD-10-CM

## 2022-11-13 DIAGNOSIS — M533 Sacrococcygeal disorders, not elsewhere classified: Secondary | ICD-10-CM

## 2022-11-13 DIAGNOSIS — E1159 Type 2 diabetes mellitus with other circulatory complications: Secondary | ICD-10-CM

## 2022-11-13 DIAGNOSIS — I152 Hypertension secondary to endocrine disorders: Secondary | ICD-10-CM

## 2022-11-13 DIAGNOSIS — E1169 Type 2 diabetes mellitus with other specified complication: Secondary | ICD-10-CM

## 2022-11-13 DIAGNOSIS — L299 Pruritus, unspecified: Secondary | ICD-10-CM

## 2022-11-13 DIAGNOSIS — D691 Qualitative platelet defects: Secondary | ICD-10-CM

## 2022-11-13 HISTORY — DX: Pulsatile tinnitus, left ear: H93.A2

## 2022-11-13 NOTE — Patient Instructions (Addendum)
Try the xyzal for two weeks - if this doesn't work then we can look into making other changes.

## 2022-11-13 NOTE — Progress Notes (Signed)
Tollie Eth, DNP, AGNP-c [facility] [address] [place], [place] [address] [phone]  Subjective:   Jeanne Shaw is a 67 y.o. female presents to day for evaluation of: Jeanne Shaw presents today with chief complaints of experiencing a constant swishing sound in her left ear that began approximately three to three and a half weeks ago. She notes that the swishing sound is consistent with her pulse. Despite considering high blood pressure as a potential cause, she confirms that her blood pressure readings have been within a normal range, citing several measurements such as 132 over 80, 127 over 81, and similar readings in the 120s.  Additionally, Jeanne Shaw reports a recent increase in the frequency of headaches, which she describes as occurring mainly in the temples. She clarifies that these headaches are not migraines and do not typically require medication. Despite experiencing these headaches, she does not believe she has been under more stress lately.  Jeanne Shaw also recounts a recent episode that felt like the onset of a panic attack, marking the first occurrence in over five years. She managed to calm herself down during the episode.  She has observed that her teeth, specifically the ends, have been hurting more lately, which she finds unusual since her gums are not affected. Jeanne Shaw attributes this to potential sinus-related issues, correlating with the time of year.  Jeanne Shaw describes a long-standing issue of itching located across a specific area on her back, just above the bra strap. She notes that this itching has persisted for years, unaffected by changes in laundry soap or clothing. Treatments, including a procedure performed by her doctor in [place], have provided only temporary relief.  Regarding her lower back, Jeanne Shaw reports experiencing soreness, particularly in the mornings and occasionally throughout the day. She has not noticed any specific activities that  exacerbate or alleviate this soreness.  Jeanne Shaw is currently taking lisinopril, which she has been on for a long time, and fexofenadine (Allegra) for allergies. She mentions using Benadryl to manage symptoms of a cold that appeared three days ago, noting that it helped prevent the cold from worsening. She also states that she rarely uses nasal spray, having used it only a couple of times in the past year.  PMH, Medications, and Allergies reviewed and updated in chart as appropriate.   ROS negative except for what is listed in HPI. Objective:  BP 132/80 Comment: BP on machine 138 82  Pulse 73   Wt 167 lb (75.8 kg)   SpO2 97%   BMI 31.55 kg/m  Physical Exam Vitals and nursing note reviewed.  Constitutional:      Appearance: Normal appearance. She is not ill-appearing.  HENT:     Head: Normocephalic and atraumatic.     Right Ear: A middle ear effusion is present.     Left Ear: A middle ear effusion is present.     Nose: Mucosal edema present.     Right Turbinates: Swollen and pale.     Left Turbinates: Swollen and pale.     Mouth/Throat:     Mouth: Mucous membranes are moist.  Eyes:     Pupils: Pupils are equal, round, and reactive to light.  Neck:     Vascular: No carotid bruit.  Cardiovascular:     Rate and Rhythm: Regular rhythm. Tachycardia present.     Pulses: Normal pulses.     Heart sounds: Normal heart sounds.  Pulmonary:     Effort: Pulmonary effort is normal.     Breath sounds:  Normal breath sounds.  Abdominal:     General: Bowel sounds are normal.     Palpations: Abdomen is soft.  Musculoskeletal:        General: Tenderness present.     Cervical back: Normal range of motion.     Comments:  Tenderness and possible spasm noted in the upper right back; sacroiliac joint tenderness indicating inflammation.  Lymphadenopathy:     Cervical: No cervical adenopathy.  Skin:    General: Skin is warm and dry.     Capillary Refill: Capillary refill takes less than 2 seconds.      Comments: Pruritus present to the upper back along the bra strap. No signs of rash or erythema.   Neurological:     General: No focal deficit present.     Mental Status: She is alert.  Psychiatric:        Mood and Affect: Mood normal.           Assessment & Plan:   Problem List Items Addressed This Visit     DM (diabetes mellitus), type 2 with complications    Chronic. We will go ahead and get lab work today as she is fasting and due for this at this time. No changes today.       Relevant Orders   Hemoglobin A1c (Completed)   CBC with Differential/Platelet (Completed)   Comprehensive metabolic panel (Completed)   Lipid panel (Completed)   Hypertension associated with type 2 diabetes mellitus    Chronic hypertension. BP slightly elevated int he office today. No alarm symptoms are present. Recommend continue current regimen and monitor at home with goal BP less than 130/80. Tinnitus does not appear to be blood pressure related, but most likely related to effusion.       Relevant Orders   Hemoglobin A1c (Completed)   CBC with Differential/Platelet (Completed)   Comprehensive metabolic panel (Completed)   Lipid panel (Completed)   Hyperlipidemia associated with type 2 diabetes mellitus    Labs pending today.       Relevant Orders   Hemoglobin A1c (Completed)   CBC with Differential/Platelet (Completed)   Comprehensive metabolic panel (Completed)   Lipid panel (Completed)   Pulsatile tinnitus of left ear - Primary    The patient presents with a constant swishing sound in the left ear, consistent with the pulse, for approximately 3-3.5 weeks. Additionally, the patient reports an increased frequency of headaches, primarily located in the temples, without significant stressors. Examination suggests pulsatile tinnitus without signs of severe conditions. Fluid observed in the ear may indicate Eustachian tube dysfunction, potentially exacerbated by seasonal allergies and a  recent history of feeling a cold coming on.  Plan: - Change allergy medication to generic Xyzal (levocetirizine), to be taken at bedtime for two weeks, to address potential allergy exacerbation and assist in drying Eustachian tube fluid. - Continue with saline nasal spray as needed. - Monitor symptoms and reassess in two weeks or sooner if symptoms worsen      SI (sacroiliac) joint dysfunction    The patient describes intermittent lower back pain, particularly upon waking and occasionally throughout the day. Pain does not significantly improve with stretching. Examination suggests potential sacroiliac joint inflammation as a contributing factor.  Plan: - Recommended specific stretches for sacroiliac joint relief, including knee-to-chest stretches and arching the back while on hands and knees. - Advise continued physical activity and monitoring of symptoms.      Pruritus    The patient reports years of localized  itching above the bra strap, with no clear trigger identified. Previous treatments have provided temporary relief. The itching is relieved by scratching or application of anti-itch lotion. Examination did not reveal any obvious dermatological causes.  Plan: - Keep area well moisturized and consider use of hydrocortisone cream to help relief the itching.  - Monitor to see if this is only present when wearing a bra, if so, consider changing the bra style.      Abnormal platelets   Relevant Orders   Hemoglobin A1c (Completed)   CBC with Differential/Platelet (Completed)   Comprehensive metabolic panel (Completed)   Lipid panel (Completed)   Other Visit Diagnoses     Eustachian tube dysfunction, left             Tollie Eth, DNP, AGNP-c 11/18/2022  8:08 PM    History, Medications, Surgery, SDOH, and Family History reviewed and updated as appropriate.

## 2022-11-14 LAB — COMPREHENSIVE METABOLIC PANEL
ALT: 68 IU/L — ABNORMAL HIGH (ref 0–32)
AST: 37 IU/L (ref 0–40)
Albumin/Globulin Ratio: 1.9 (ref 1.2–2.2)
Albumin: 4.4 g/dL (ref 3.9–4.9)
Alkaline Phosphatase: 100 IU/L (ref 44–121)
BUN/Creatinine Ratio: 26 (ref 12–28)
BUN: 17 mg/dL (ref 8–27)
Bilirubin Total: 0.3 mg/dL (ref 0.0–1.2)
CO2: 23 mmol/L (ref 20–29)
Calcium: 10.4 mg/dL — ABNORMAL HIGH (ref 8.7–10.3)
Chloride: 101 mmol/L (ref 96–106)
Creatinine, Ser: 0.66 mg/dL (ref 0.57–1.00)
Globulin, Total: 2.3 g/dL (ref 1.5–4.5)
Glucose: 98 mg/dL (ref 70–99)
Potassium: 5.5 mmol/L — ABNORMAL HIGH (ref 3.5–5.2)
Sodium: 141 mmol/L (ref 134–144)
Total Protein: 6.7 g/dL (ref 6.0–8.5)
eGFR: 96 mL/min/{1.73_m2} (ref >59)

## 2022-11-14 LAB — CBC WITH DIFFERENTIAL/PLATELET
Basophils Absolute: 0.1 10*3/uL (ref 0.0–0.2)
Basos: 1 %
EOS (ABSOLUTE): 0.4 10*3/uL (ref 0.0–0.4)
Eos: 3 %
Hematocrit: 44.4 % (ref 34.0–46.6)
Hemoglobin: 14.9 g/dL (ref 11.1–15.9)
Immature Grans (Abs): 0 10*3/uL (ref 0.0–0.1)
Immature Granulocytes: 0 %
Lymphocytes Absolute: 3.4 10*3/uL — ABNORMAL HIGH (ref 0.7–3.1)
Lymphs: 32 %
MCH: 31.2 pg (ref 26.6–33.0)
MCHC: 33.6 g/dL (ref 31.5–35.7)
MCV: 93 fL (ref 79–97)
Monocytes Absolute: 0.5 10*3/uL (ref 0.1–0.9)
Monocytes: 5 %
Neutrophils Absolute: 6.2 10*3/uL (ref 1.4–7.0)
Neutrophils: 59 %
Platelets: 521 10*3/uL — ABNORMAL HIGH (ref 150–450)
RBC: 4.78 x10E6/uL (ref 3.77–5.28)
RDW: 12.3 % (ref 11.7–15.4)
WBC: 10.6 10*3/uL (ref 3.4–10.8)

## 2022-11-14 LAB — LIPID PANEL
Chol/HDL Ratio: 4.1 ratio (ref 0.0–4.4)
Cholesterol, Total: 134 mg/dL (ref 100–199)
HDL: 33 mg/dL — ABNORMAL LOW (ref >39)
LDL Chol Calc (NIH): 61 mg/dL (ref 0–99)
Triglycerides: 246 mg/dL — ABNORMAL HIGH (ref 0–149)
VLDL Cholesterol Cal: 40 mg/dL (ref 5–40)

## 2022-11-14 LAB — HEMOGLOBIN A1C
Est. average glucose Bld gHb Est-mCnc: 166 mg/dL
Hgb A1c MFr Bld: 7.4 % — ABNORMAL HIGH (ref 4.8–5.6)

## 2022-11-17 ENCOUNTER — Ambulatory Visit (INDEPENDENT_AMBULATORY_CARE_PROVIDER_SITE_OTHER): Payer: Medicare Other

## 2022-11-17 VITALS — Ht 61.0 in | Wt 164.0 lb

## 2022-11-17 DIAGNOSIS — Z0000 Encounter for general adult medical examination without abnormal findings: Secondary | ICD-10-CM

## 2022-11-17 LAB — HM DIABETES EYE EXAM

## 2022-11-17 NOTE — Progress Notes (Signed)
I connected with  Jeanne Shaw on 11/17/22 by a audio enabled telemedicine application and verified that I am speaking with the correct person using two identifiers.  Patient Location: Home  Provider Location: Office/Clinic  I discussed the limitations of evaluation and management by telemedicine. The patient expressed understanding and agreed to proceed.  Subjective:   Jeanne Shaw is a 67 y.o. female who presents for Medicare Annual (Subsequent) preventive examination.  Patient Medicare AWV questionnaire was completed by the patient on 11/16/2022; I have confirmed that all information answered by patient is correct and no changes since this date.     Review of Systems     Cardiac Risk Factors include: advanced age (>101men, >87 women);diabetes mellitus;dyslipidemia;hypertension;obesity (BMI >30kg/m2)     Objective:    Today's Vitals   11/17/22 0941  Weight: 164 lb (74.4 kg)  Height:  (1.549 m)   Body mass index is 30.99 kg/m.     11/17/2022    9:44 AM 03/30/2022    8:34 AM 11/14/2021    1:14 PM 03/26/2020    7:21 AM  Advanced Directives  Does Patient Have a Medical Advance Directive? No No No No  Would patient like information on creating a medical advance directive?  No - Patient declined No - Patient declined No - Patient declined    Current Medications (verified) Outpatient Encounter Medications as of 11/17/2022  Medication Sig   albuterol (VENTOLIN HFA) 108 (90 Base) MCG/ACT inhaler Inhale 1-2 puffs into the lungs every 6 (six) hours as needed for wheezing or shortness of breath.   Ascorbic Acid (VITAMIN C) 1000 MG tablet Take 1,000 mg by mouth daily.   aspirin EC 81 MG tablet Take 81 mg by mouth daily.   Cholecalciferol (VITAMIN D) 125 MCG (5000 UT) CAPS Take 5,000 Units by mouth daily.   estradiol (ESTRACE) 0.1 MG/GM vaginal cream Place 0.5g nightly for two weeks then twice a week after   famotidine (PEPCID) 40 MG tablet Take 1 tablet  (40 mg total) by mouth daily.   fexofenadine (EQ ALLERGY RELIEF) 180 MG tablet TAKE 1 TABLET BY MOUTH ONCE DAILY FOR ALLERGIES   Flaxseed, Linseed, (FLAX SEEDS PO) Take 1 tablet by mouth daily.    lisinopril (ZESTRIL) 10 MG tablet Take 1 tablet (10 mg total) by mouth daily.   metFORMIN (GLUCOPHAGE) 1000 MG tablet Take 1 tablet (1,000 mg total) by mouth 2 (two) times daily with a meal. TAKE 1 TABLET BY MOUTH TWICE DAILY WITH A MEAL   MILK THISTLE PO Take 1 tablet by mouth at bedtime.    Multiple Vitamins-Minerals (PRESERVISION AREDS) CAPS Take 1 capsule by mouth at bedtime.    Omega-3 Fatty Acids (FISH OIL) 1000 MG CAPS Take 1 capsule (1,000 mg total) by mouth daily.   Polyethyl Glycol-Propyl Glycol (SYSTANE) 0.4-0.3 % SOLN Apply 1 drop to eye daily.   Red Yeast Rice Extract (RED YEAST RICE PO) Take 1 tablet by mouth daily.    Semaglutide, 1 MG/DOSE, 4 MG/3ML SOPN Inject 1 mg as directed once a week.   simvastatin (ZOCOR) 10 MG tablet Take 1 tablet (10 mg total) by mouth at bedtime.   sodium chloride (OCEAN) 0.65 % SOLN nasal spray Place 1 spray into both nostrils daily as needed for congestion.   Zinc 50 MG CAPS    No facility-administered encounter medications on file as of 11/17/2022.    Allergies (verified) Farxiga [dapagliflozin] and Hydrocodone   History: Past Medical History:  Diagnosis Date  Acute swimmer's ear of right side 11/24/2021   Allergy    Anxiety    Asthma    Cataract    OU   Diabetes mellitus without complication    GERD (gastroesophageal reflux disease)    Hypertension    Macular degeneration    Dry OU   Osteopenia    Rosacea    Subacute vaginitis 04/25/2021   Past Surgical History:  Procedure Laterality Date   BIOPSY  03/26/2020   Procedure: BIOPSY;  Surgeon: Dolores Frame, MD;  [facility]: [facility];  Service: Gastroenterology;;   COLONOSCOPY WITH PROPOFOL N/A 03/26/2020   Procedure: COLONOSCOPY WITH PROPOFOL;  Surgeon: Dolores Frame, MD;  Location: [facility];  Service: Gastroenterology;  Laterality: N/A;  815   CYST EXCISION     off vocal cord    POLYPECTOMY  03/26/2020   Procedure: POLYPECTOMY;  Surgeon: Marguerita Merles, Reuel Boom, MD;  Location: [facility];  Service: Gastroenterology;;   Family History  Problem Relation Age of Onset   COPD Mother    Alcohol abuse Father    Diabetes Father    Heart disease Father    Hyperlipidemia Father    Hypertension Father    Cancer Sister    Diabetes Sister    Hyperlipidemia Sister    Hypertension Sister    Breast cancer Sister 49   Hypertension Brother    Alcohol abuse Maternal Aunt    Alcohol abuse Maternal Uncle    Hypertension Maternal Uncle    Alcohol abuse Paternal Aunt    Cancer Paternal Aunt    Alcohol abuse Paternal Uncle    Cancer Paternal Uncle    Diabetes Maternal Grandmother    Stroke Maternal Grandmother    Heart disease Maternal Grandfather    Social History   Socioeconomic History   Marital status: Married    Spouse name: Not on file   Number of children: Not on file   Years of education: Not on file   Highest education level: Not on file  Occupational History   Not on file  Tobacco Use   Smoking status: Former    Packs/day: 0.25    Years: 30.00    Additional pack years: 0.00    Total pack years: 7.50    Types: Cigarettes    Quit date: 08/03/2015    Years since quitting: 7.2   Smokeless tobacco: Former  Building services engineer Use: Never used  Substance and Sexual Activity   Alcohol use: Yes    Alcohol/week: 12.0 standard drinks of alcohol    Types: 12 Glasses of wine per week    Comment: beer occassionally   Drug use: No   Sexual activity: Yes  Other Topics Concern   Not on file  Social History Narrative   Not on file   Social Determinants of Health   Financial Resource Strain: Low Risk  (11/16/2022)   Overall Financial Resource Strain (CARDIA)    Difficulty of Paying Living Expenses: Not hard at all  Food  Insecurity: No Food Insecurity (11/16/2022)   Hunger Vital Sign    Worried About Running Out of Food in the Last Year: Never true    Ran Out of Food in the Last Year: Never true  Transportation Needs: No Transportation Needs (11/16/2022)   PRAPARE - Administrator, Civil Service (Medical): No    Lack of Transportation (Non-Medical): No  Physical Activity: Insufficiently Active (11/16/2022)   Exercise Vital Sign  Days of Exercise per Week: 2 days    Minutes of Exercise per Session: 20 min  Stress: No Stress Concern Present (11/16/2022)   [employer] of Occupational Health - Occupational Stress Questionnaire    Feeling of Stress : Not at all  Social Connections: Unknown (11/16/2022)   Social Connection and Isolation Panel [NHANES]    Frequency of Communication with Friends and Family: Three times a week    Frequency of Social Gatherings with Friends and Family: Twice a week    Attends Religious Services: Not on Marketing executive or Organizations: Patient declined    Attends Banker Meetings: Patient declined    Marital Status: Married    Tobacco Counseling Counseling given: Not Answered   Clinical Intake:  Pre-visit preparation completed: Yes  Pain : No/denies pain     Nutritional Status: BMI > 30  Obese Nutritional Risks: None Diabetes: Yes  How often do you need to have someone help you when you read instructions, pamphlets, or other written materials from your doctor or pharmacy?: 1 - Never  Diabetic? Yes Nutrition Risk Assessment:  Has the patient had any N/V/D within the last 2 months?  No  Does the patient have any non-healing wounds?  No  Has the patient had any unintentional weight loss or weight gain?  No   Diabetes:  Is the patient diabetic?  Yes  If diabetic, was a CBG obtained today?  No  Did the patient bring in their glucometer from home?  No  How often do you monitor your CBG's? Once weekly.   Financial  Strains and Diabetes Management:  Are you having any financial strains with the device, your supplies or your medication? No .  Does the patient want to be seen by Chronic Care Management for management of their diabetes?  No  Would the patient like to be referred to a Nutritionist or for Diabetic Management?  No   Diabetic Exams:  Diabetic Eye Exam: Completed 03/18/2022 Diabetic Foot Exam: Completed 05/22/2022   Interpreter Needed?: No  Information entered by :: NAllen LPN   Activities of Daily Living    11/16/2022    6:07 PM 02/19/2022    2:33 PM  In your present state of health, do you have any difficulty performing the following activities:  Hearing? 0 0  Vision? 0 0  Difficulty concentrating or making decisions? 0 0  Walking or climbing stairs? 0 0  Dressing or bathing? 0 0  Doing errands, shopping? 0 0  Preparing Food and eating ? N   Using the Toilet? N   In the past six months, have you accidently leaked urine? N   Do you have problems with loss of bowel control? N   Managing your Medications? N   Managing your Finances? N   Housekeeping or managing your Housekeeping? N     Patient Care Team: Early, Sung Amabile, NP as PCP - General (Nurse Practitioner)  Indicate any recent Medical Services you may have received from other than [facility] providers in the past year (date may be approximate).     Assessment:   This is a routine wellness examination for [place].  Hearing/Vision screen Vision Screening - Comments:: Regular eye exams, Dr. Chyrl Civatte, Dr. Dione Booze  Dietary issues and exercise activities discussed: Current Exercise Habits: Home exercise routine, Type of exercise: walking, Time (Minutes): 20, Frequency (Times/Week): 2, Weekly Exercise (Minutes/Week): 40   Goals Addressed  This Visit's Progress    Patient Stated       11/17/2022, stay healthy       Depression Screen    11/17/2022    9:45 AM 02/19/2022    2:33 PM 11/14/2021   12:58 PM 04/25/2021     9:54 AM 07/15/2020    8:04 AM 01/19/2020    8:08 AM 03/07/2019    8:34 AM  PHQ 2/9 Scores  PHQ - 2 Score 0 0 0 0 0 0 0  PHQ- 9 Score 0   0  0   Exception Documentation  Medical reason         Fall Risk    11/16/2022    6:07 PM 08/25/2022    8:34 AM 02/19/2022    2:32 PM 11/14/2021    1:14 PM 04/25/2021    9:54 AM  Fall Risk   Falls in the past year? 0 0 0 0 0  Number falls in past yr: 0 0 0 0 0  Injury with Fall? 0 0 0 0 0  Risk for fall due to : Medication side effect No Fall Risks   No Fall Risks  Follow up Falls prevention discussed;Education provided;Falls evaluation completed Falls evaluation completed Falls evaluation completed;Education provided  Falls evaluation completed    FALL RISK PREVENTION PERTAINING TO THE HOME:  Any stairs in or around the home? No  If so, are there any without handrails? N/a Home free of loose throw rugs in walkways, pet beds, electrical cords, etc? Yes  Adequate lighting in your home to reduce risk of falls? Yes   ASSISTIVE DEVICES UTILIZED TO PREVENT FALLS:  Life alert? No  Use of a cane, walker or w/c? No  Grab bars in the bathroom? Yes  Shower chair or bench in shower? Yes  Elevated toilet seat or a handicapped toilet? No   TIMED UP AND GO:  Was the test performed? No .      Cognitive Function:        11/17/2022    9:45 AM 11/14/2021    1:12 PM  6CIT Screen  What Year? 0 points 0 points  What month? 0 points 0 points  What time? 0 points 0 points  Count back from 20 0 points 0 points  Months in reverse 0 points 0 points  Repeat phrase 2 points 2 points  Total Score 2 points 2 points    Immunizations Immunization History  Administered Date(s) Administered   Hepatitis B 10/25/2015, 11/26/2015, 07/01/2016   Pneumococcal Polysaccharide-23 07/15/2020   Tdap 03/27/2011, 03/07/2019   Zoster Recombinat (Shingrix) 04/05/2015    TDAP status: Up to date  Flu Vaccine status: Up to date  Pneumococcal vaccine status: Up to  date  Covid-19 vaccine status: Declined, Education has been provided regarding the importance of this vaccine but patient still declined. Advised may receive this vaccine at local pharmacy or Health Dept.or vaccine clinic. Aware to provide a copy of the vaccination record if obtained from local pharmacy or Health Dept. Verbalized acceptance and understanding.  Qualifies for Shingles Vaccine? Yes   Zostavax completed Yes   Shingrix Completed?: No.    Education has been provided regarding the importance of this vaccine. Patient has been advised to call insurance company to determine out of pocket expense if they have not yet received this vaccine. Advised may also receive vaccine at local pharmacy or Health Dept. Verbalized acceptance and understanding.  Screening Tests Health Maintenance  Topic Date Due   Zoster Vaccines-  Shingrix (2 of 2) 05/31/2015   Diabetic kidney evaluation - Urine ACR  01/18/2021   Pneumonia Vaccine 109+ Years old (2 of 2 - PCV) 07/15/2021   Medicare Annual Wellness (AWV)  11/15/2022   INFLUENZA VACCINE  08/03/2028 (Originally 03/04/2023)   OPHTHALMOLOGY EXAM  03/19/2023   HEMOGLOBIN A1C  05/15/2023   FOOT EXAM  05/23/2023   Diabetic kidney evaluation - eGFR measurement  11/13/2023   MAMMOGRAM  07/02/2024   COLONOSCOPY (Pts 45-78yrs Insurance coverage will need to be confirmed)  03/26/2025   DEXA SCAN  08/19/2027   DTaP/Tdap/Td (3 - Td or Tdap) 03/06/2029   Hepatitis C Screening  Completed   HPV VACCINES  Aged Out   COVID-19 Vaccine  Discontinued    Health Maintenance  Health Maintenance Due  Topic Date Due   Zoster Vaccines- Shingrix (2 of 2) 05/31/2015   Diabetic kidney evaluation - Urine ACR  01/18/2021   Pneumonia Vaccine 99+ Years old (2 of 2 - PCV) 07/15/2021   Medicare Annual Wellness (AWV)  11/15/2022    Colorectal cancer screening: Type of screening: Colonoscopy. Completed 03/26/2020. Repeat every 5 years  Mammogram status: Completed 07/02/2022.  Repeat every year  Bone Density status: Completed 08/18/2022.  Lung Cancer Screening: (Low Dose CT Chest recommended if Age 78-80 years, 30 pack-year currently smoking OR have quit w/in 15years.) does not qualify.   Lung Cancer Screening Referral: no  Additional Screening:  Hepatitis C Screening: does qualify; Completed 08/02/2017  Vision Screening: Recommended annual ophthalmology exams for early detection of glaucoma and other disorders of the eye. Is the patient up to date with their annual eye exam?  Yes  Who is the provider or what is the name of the office in which the patient attends annual eye exams? Dr. Chyrl Civatte and Dr. Dione Booze  If pt is not established with a provider, would they like to be referred to a provider to establish care? No .   Dental Screening: Recommended annual dental exams for proper oral hygiene  Community Resource Referral / Chronic Care Management: CRR required this visit?  No   CCM required this visit?  No      Plan:     I have personally reviewed and noted the following in the patient's chart:   Medical and social history Use of alcohol, tobacco or illicit drugs  Current medications and supplements including opioid prescriptions. Patient is not currently taking opioid prescriptions. Functional ability and status Nutritional status Physical activity Advanced directives List of other physicians Hospitalizations, surgeries, and ER visits in previous 12 months Vitals Screenings to include cognitive, depression, and falls Referrals and appointments  In addition, I have reviewed and discussed with patient certain preventive protocols, quality metrics, and best practice recommendations. A written personalized care plan for preventive services as well as general preventive health recommendations were provided to patient.     Barb Merino, LPN   1/61/0960   Nurse Notes: none  Due to this being a virtual visit, the after visit summary with  patients personalized plan was offered to patient via mail or my-chart.  Patient would like to access on my-chart

## 2022-11-17 NOTE — Patient Instructions (Signed)
[name] , Thank you for taking time to come for your Medicare Wellness Visit. I appreciate your ongoing commitment to your health goals. Please review the following plan we discussed and let me know if I can assist you in the future.   These are the goals we discussed:  Goals      Patient Stated     11/17/2022, stay healthy     Weight (lb) < 200 lb (90.7 kg)     And to also cut carb intake.         This is a list of the screening recommended for you and due dates:  Health Maintenance  Topic Date Due   Zoster (Shingles) Vaccine (2 of 2) 05/31/2015   Yearly kidney health urinalysis for diabetes  01/18/2021   Pneumonia Vaccine (2 of 2 - PCV) 07/15/2021   Flu Shot  08/03/2028*   Eye exam for diabetics  03/19/2023   Hemoglobin A1C  05/15/2023   Complete foot exam   05/23/2023   Yearly kidney function blood test for diabetes  11/13/2023   Medicare Annual Wellness Visit  11/17/2023   Mammogram  07/02/2024   Colon Cancer Screening  03/26/2025   DEXA scan (bone density measurement)  08/19/2027   DTaP/Tdap/Td vaccine (3 - Td or Tdap) 03/06/2029   Hepatitis C Screening: USPSTF Recommendation to screen - Ages 5-79 yo.  Completed   HPV Vaccine  Aged Out   COVID-19 Vaccine  Discontinued  *Topic was postponed. The date shown is not the original due date.    Advanced directives: Advance directive discussed with you today.   Conditions/risks identified: none  Next appointment: Follow up in one year for your annual wellness visit    Preventive Care 65 Years and Older, Female Preventive care refers to lifestyle choices and visits with your health care provider that can promote health and wellness. What does preventive care include? A yearly physical exam. This is also called an annual well check. Dental exams once or twice a year. Routine eye exams. Ask your health care provider how often you should have your eyes checked. Personal lifestyle choices, including: Daily care of  your teeth and gums. Regular physical activity. Eating a healthy diet. Avoiding tobacco and drug use. Limiting alcohol use. Practicing safe sex. Taking low-dose aspirin every day. Taking vitamin and mineral supplements as recommended by your health care provider. What happens during an annual well check? The services and screenings done by your health care provider during your annual well check will depend on your age, overall health, lifestyle risk factors, and family history of disease. Counseling  Your health care provider may ask you questions about your: Alcohol use. Tobacco use. Drug use. Emotional well-being. Home and relationship well-being. Sexual activity. Eating habits. History of falls. Memory and ability to understand (cognition). Work and work Astronomer. Reproductive health. Screening  You may have the following tests or measurements: Height, weight, and BMI. Blood pressure. Lipid and cholesterol levels. These may be checked every 5 years, or more frequently if you are over 35 years old. Skin check. Lung cancer screening. You may have this screening every year starting at age 56 if you have a 30-pack-year history of smoking and currently smoke or have quit within the past 15 years. Fecal occult blood test (FOBT) of the stool. You may have this test every year starting at age 70. Flexible sigmoidoscopy or colonoscopy. You may have a sigmoidoscopy every 5 years or a colonoscopy every 10 years starting at  age 23. Hepatitis C blood test. Hepatitis B blood test. Sexually transmitted disease (STD) testing. Diabetes screening. This is done by checking your blood sugar (glucose) after you have not eaten for a while (fasting). You may have this done every 1-3 years. Bone density scan. This is done to screen for osteoporosis. You may have this done starting at age 30. Mammogram. This may be done every 1-2 years. Talk to your health care provider about how often you should  have regular mammograms. Talk with your health care provider about your test results, treatment options, and if necessary, the need for more tests. Vaccines  Your health care provider may recommend certain vaccines, such as: Influenza vaccine. This is recommended every year. Tetanus, diphtheria, and acellular pertussis (Tdap, Td) vaccine. You may need a Td booster every 10 years. Zoster vaccine. You may need this after age 68. Pneumococcal 13-valent conjugate (PCV13) vaccine. One dose is recommended after age 2. Pneumococcal polysaccharide (PPSV23) vaccine. One dose is recommended after age 89. Talk to your health care provider about which screenings and vaccines you need and how often you need them. This information is not intended to replace advice given to you by your health care provider. Make sure you discuss any questions you have with your health care provider. Document Released: 08/16/2015 Document Revised: 04/08/2016 Document Reviewed: 05/21/2015 Elsevier Interactive Patient Education  2017 [place] Prevention in the Home Falls can cause injuries. They can happen to people of all ages. There are many things you can do to make your home safe and to help prevent falls. What can I do on the outside of my home? Regularly fix the edges of walkways and driveways and fix any cracks. Remove anything that might make you trip as you walk through a door, such as a raised step or threshold. Trim any bushes or trees on the path to your home. Use bright outdoor lighting. Clear any walking paths of anything that might make someone trip, such as rocks or tools. Regularly check to see if handrails are loose or broken. Make sure that both sides of any steps have handrails. Any raised decks and porches should have guardrails on the edges. Have any leaves, snow, or ice cleared regularly. Use sand or salt on walking paths during winter. Clean up any spills in your garage right away. This  includes oil or grease spills. What can I do in the bathroom? Use night lights. Install grab bars by the toilet and in the tub and shower. Do not use towel bars as grab bars. Use non-skid mats or decals in the tub or shower. If you need to sit down in the shower, use a plastic, non-slip stool. Keep the floor dry. Clean up any water that spills on the floor as soon as it happens. Remove soap buildup in the tub or shower regularly. Attach bath mats securely with double-sided non-slip rug tape. Do not have throw rugs and other things on the floor that can make you trip. What can I do in the bedroom? Use night lights. Make sure that you have a light by your bed that is easy to reach. Do not use any sheets or blankets that are too big for your bed. They should not hang down onto the floor. Have a firm chair that has side arms. You can use this for support while you get dressed. Do not have throw rugs and other things on the floor that can make you trip. What can I  do in the kitchen? Clean up any spills right away. Avoid walking on wet floors. Keep items that you use a lot in easy-to-reach places. If you need to reach something above you, use a strong step stool that has a grab bar. Keep electrical cords out of the way. Do not use floor polish or wax that makes floors slippery. If you must use wax, use non-skid floor wax. Do not have throw rugs and other things on the floor that can make you trip. What can I do with my stairs? Do not leave any items on the stairs. Make sure that there are handrails on both sides of the stairs and use them. Fix handrails that are broken or loose. Make sure that handrails are as long as the stairways. Check any carpeting to make sure that it is firmly attached to the stairs. Fix any carpet that is loose or worn. Avoid having throw rugs at the top or bottom of the stairs. If you do have throw rugs, attach them to the floor with carpet tape. Make sure that you  have a light switch at the top of the stairs and the bottom of the stairs. If you do not have them, ask someone to add them for you. What else can I do to help prevent falls? Wear shoes that: Do not have high heels. Have rubber bottoms. Are comfortable and fit you well. Are closed at the toe. Do not wear sandals. If you use a stepladder: Make sure that it is fully opened. Do not climb a closed stepladder. Make sure that both sides of the stepladder are locked into place. Ask someone to hold it for you, if possible. Clearly mark and make sure that you can see: Any grab bars or handrails. First and last steps. Where the edge of each step is. Use tools that help you move around (mobility aids) if they are needed. These include: Canes. Walkers. Scooters. Crutches. Turn on the lights when you go into a dark area. Replace any light bulbs as soon as they burn out. Set up your furniture so you have a clear path. Avoid moving your furniture around. If any of your floors are uneven, fix them. If there are any pets around you, be aware of where they are. Review your medicines with your doctor. Some medicines can make you feel dizzy. This can increase your chance of falling. Ask your doctor what other things that you can do to help prevent falls. This information is not intended to replace advice given to you by your health care provider. Make sure you discuss any questions you have with your health care provider. Document Released: 05/16/2009 Document Revised: 12/26/2015 Document Reviewed: 08/24/2014 Elsevier Interactive Patient Education  2017 [employer].

## 2022-11-18 ENCOUNTER — Encounter: Payer: Self-pay | Admitting: Nurse Practitioner

## 2022-11-18 ENCOUNTER — Telehealth: Payer: Self-pay

## 2022-11-18 DIAGNOSIS — M533 Sacrococcygeal disorders, not elsewhere classified: Secondary | ICD-10-CM | POA: Insufficient documentation

## 2022-11-18 DIAGNOSIS — L299 Pruritus, unspecified: Secondary | ICD-10-CM | POA: Insufficient documentation

## 2022-11-18 HISTORY — DX: Pruritus, unspecified: L29.9

## 2022-11-18 NOTE — Telephone Encounter (Signed)
Received fax from [facility] requesting refill for ozempic /dose /86ml pen, quantity of 3. Last sent on 08/25/22 with 2 refills. Has upcoming appt on 11/30/22.

## 2022-11-18 NOTE — Assessment & Plan Note (Signed)
Chronic. We will go ahead and get lab work today as she is fasting and due for this at this time. No changes today.

## 2022-11-18 NOTE — Assessment & Plan Note (Addendum)
Chronic hypertension. BP slightly elevated int he office today. No alarm symptoms are present. Recommend continue current regimen and monitor at home with goal BP less than 130/80. Tinnitus does not appear to be blood pressure related, but most likely related to effusion.

## 2022-11-18 NOTE — Assessment & Plan Note (Signed)
The patient presents with a constant swishing sound in the left ear, consistent with the pulse, for approximately 3-3.5 weeks. Additionally, the patient reports an increased frequency of headaches, primarily located in the temples, without significant stressors. Examination suggests pulsatile tinnitus without signs of severe conditions. Fluid observed in the ear may indicate Eustachian tube dysfunction, potentially exacerbated by seasonal allergies and a recent history of feeling a cold coming on.  Plan: - Change allergy medication to generic Xyzal (levocetirizine), to be taken at bedtime for two weeks, to address potential allergy exacerbation and assist in drying Eustachian tube fluid. - Continue with saline nasal spray as needed. - Monitor symptoms and reassess in two weeks or sooner if symptoms worsen

## 2022-11-18 NOTE — Assessment & Plan Note (Signed)
The patient describes intermittent lower back pain, particularly upon waking and occasionally throughout the day. Pain does not significantly improve with stretching. Examination suggests potential sacroiliac joint inflammation as a contributing factor.  Plan: - Recommended specific stretches for sacroiliac joint relief, including knee-to-chest stretches and arching the back while on hands and knees. - Advise continued physical activity and monitoring of symptoms.

## 2022-11-18 NOTE — Assessment & Plan Note (Signed)
The patient reports years of localized itching above the bra strap, with no clear trigger identified. Previous treatments have provided temporary relief. The itching is relieved by scratching or application of anti-itch lotion. Examination did not reveal any obvious dermatological causes.  Plan: - Keep area well moisturized and consider use of hydrocortisone cream to help relief the itching.  - Monitor to see if this is only present when wearing a bra, if so, consider changing the bra style.

## 2022-11-18 NOTE — Assessment & Plan Note (Signed)
Labs pending today

## 2022-11-20 ENCOUNTER — Other Ambulatory Visit: Payer: Self-pay

## 2022-11-20 DIAGNOSIS — E669 Obesity, unspecified: Secondary | ICD-10-CM

## 2022-11-20 DIAGNOSIS — E782 Mixed hyperlipidemia: Secondary | ICD-10-CM

## 2022-11-20 DIAGNOSIS — E118 Type 2 diabetes mellitus with unspecified complications: Secondary | ICD-10-CM

## 2022-11-20 DIAGNOSIS — I10 Essential (primary) hypertension: Secondary | ICD-10-CM

## 2022-11-20 DIAGNOSIS — R7989 Other specified abnormal findings of blood chemistry: Secondary | ICD-10-CM

## 2022-11-20 MED ORDER — SEMAGLUTIDE (1 MG/DOSE) 4 MG/3ML ~~LOC~~ SOPN: 1.0000 mg | PEN_INJECTOR | SUBCUTANEOUS | 2 refills | Status: DC

## 2022-11-20 NOTE — Telephone Encounter (Signed)
Refill ok per [place] Dec

## 2022-11-20 NOTE — Telephone Encounter (Signed)
Done

## 2022-11-20 NOTE — Telephone Encounter (Signed)
OK to send refill requested. Thank you.

## 2022-11-23 ENCOUNTER — Encounter: Payer: Self-pay | Admitting: Nurse Practitioner

## 2022-11-23 ENCOUNTER — Other Ambulatory Visit (HOSPITAL_BASED_OUTPATIENT_CLINIC_OR_DEPARTMENT_OTHER): Payer: Self-pay | Admitting: Nurse Practitioner

## 2022-11-23 DIAGNOSIS — T7840XA Allergy, unspecified, initial encounter: Secondary | ICD-10-CM

## 2022-11-24 ENCOUNTER — Other Ambulatory Visit: Payer: Self-pay

## 2022-11-24 DIAGNOSIS — T7840XA Allergy, unspecified, initial encounter: Secondary | ICD-10-CM

## 2022-11-24 MED ORDER — FEXOFENADINE HCL 180 MG PO TABS
ORAL_TABLET | ORAL | 0 refills | Status: DC
Start: 2022-11-24 — End: 2023-02-16

## 2022-11-30 ENCOUNTER — Encounter: Payer: Medicare Other | Admitting: Nurse Practitioner

## 2022-12-02 ENCOUNTER — Encounter: Payer: Self-pay | Admitting: Nurse Practitioner

## 2023-02-01 ENCOUNTER — Telehealth: Payer: Self-pay | Admitting: Nurse Practitioner

## 2023-02-01 DIAGNOSIS — E669 Obesity, unspecified: Secondary | ICD-10-CM

## 2023-02-01 DIAGNOSIS — I10 Essential (primary) hypertension: Secondary | ICD-10-CM

## 2023-02-01 DIAGNOSIS — E118 Type 2 diabetes mellitus with unspecified complications: Secondary | ICD-10-CM

## 2023-02-01 DIAGNOSIS — R7989 Other specified abnormal findings of blood chemistry: Secondary | ICD-10-CM

## 2023-02-01 DIAGNOSIS — E782 Mixed hyperlipidemia: Secondary | ICD-10-CM

## 2023-02-01 NOTE — Telephone Encounter (Signed)
[facility] sent refill request for ozempic please send to the [id number] - [place], [place] - [address]

## 2023-02-02 MED ORDER — SEMAGLUTIDE (1 MG/DOSE) 4 MG/3ML ~~LOC~~ SOPN: 1.0000 mg | PEN_INJECTOR | SUBCUTANEOUS | 2 refills | Status: DC

## 2023-02-02 NOTE — Addendum Note (Signed)
Addended by: Dhwani Venkatesh, [place] Dec E on: 02/02/2023 01:48 PM   Modules accepted: Orders

## 2023-02-16 ENCOUNTER — Other Ambulatory Visit: Payer: Self-pay

## 2023-02-16 DIAGNOSIS — T7840XA Allergy, unspecified, initial encounter: Secondary | ICD-10-CM

## 2023-02-16 MED ORDER — FEXOFENADINE HCL 180 MG PO TABS
ORAL_TABLET | ORAL | 1 refills | Status: DC
Start: 2023-02-16 — End: 2023-08-12

## 2023-02-18 ENCOUNTER — Other Ambulatory Visit: Payer: Self-pay | Admitting: Nurse Practitioner

## 2023-02-18 DIAGNOSIS — T7840XA Allergy, unspecified, initial encounter: Secondary | ICD-10-CM

## 2023-04-18 ENCOUNTER — Other Ambulatory Visit: Payer: Self-pay | Admitting: Nurse Practitioner

## 2023-04-18 DIAGNOSIS — R7989 Other specified abnormal findings of blood chemistry: Secondary | ICD-10-CM

## 2023-04-18 DIAGNOSIS — E118 Type 2 diabetes mellitus with unspecified complications: Secondary | ICD-10-CM

## 2023-04-18 DIAGNOSIS — I10 Essential (primary) hypertension: Secondary | ICD-10-CM

## 2023-04-18 DIAGNOSIS — E782 Mixed hyperlipidemia: Secondary | ICD-10-CM

## 2023-04-18 DIAGNOSIS — E669 Obesity, unspecified: Secondary | ICD-10-CM

## 2023-04-26 ENCOUNTER — Ambulatory Visit (INDEPENDENT_AMBULATORY_CARE_PROVIDER_SITE_OTHER): Payer: Medicare Other | Admitting: Nurse Practitioner

## 2023-04-26 ENCOUNTER — Encounter: Payer: Self-pay | Admitting: Nurse Practitioner

## 2023-04-26 VITALS — BP 122/74 | HR 68 | Wt 162.8 lb

## 2023-04-26 DIAGNOSIS — K219 Gastro-esophageal reflux disease without esophagitis: Secondary | ICD-10-CM

## 2023-04-26 DIAGNOSIS — R200 Anesthesia of skin: Secondary | ICD-10-CM | POA: Insufficient documentation

## 2023-04-26 DIAGNOSIS — E1159 Type 2 diabetes mellitus with other circulatory complications: Secondary | ICD-10-CM

## 2023-04-26 DIAGNOSIS — D691 Qualitative platelet defects: Secondary | ICD-10-CM

## 2023-04-26 DIAGNOSIS — E118 Type 2 diabetes mellitus with unspecified complications: Secondary | ICD-10-CM

## 2023-04-26 DIAGNOSIS — E1169 Type 2 diabetes mellitus with other specified complication: Secondary | ICD-10-CM | POA: Diagnosis not present

## 2023-04-26 DIAGNOSIS — E782 Mixed hyperlipidemia: Secondary | ICD-10-CM

## 2023-04-26 DIAGNOSIS — R7989 Other specified abnormal findings of blood chemistry: Secondary | ICD-10-CM

## 2023-04-26 DIAGNOSIS — I152 Hypertension secondary to endocrine disorders: Secondary | ICD-10-CM

## 2023-04-26 DIAGNOSIS — I10 Essential (primary) hypertension: Secondary | ICD-10-CM

## 2023-04-26 DIAGNOSIS — E559 Vitamin D deficiency, unspecified: Secondary | ICD-10-CM

## 2023-04-26 DIAGNOSIS — E669 Obesity, unspecified: Secondary | ICD-10-CM

## 2023-04-26 DIAGNOSIS — E785 Hyperlipidemia, unspecified: Secondary | ICD-10-CM

## 2023-04-26 DIAGNOSIS — E875 Hyperkalemia: Secondary | ICD-10-CM | POA: Insufficient documentation

## 2023-04-26 DIAGNOSIS — R202 Paresthesia of skin: Secondary | ICD-10-CM

## 2023-04-26 HISTORY — DX: Paresthesia of skin: R20.0

## 2023-04-26 MED ORDER — VITAMIN D 125 MCG (5000 UT) PO CAPS: 5000.0000 [IU] | ORAL_CAPSULE | Freq: Every day | ORAL | 3 refills | Status: AC

## 2023-04-26 MED ORDER — SIMVASTATIN 10 MG PO TABS
10.0000 mg | ORAL_TABLET | Freq: Every day | ORAL | 3 refills | Status: DC
Start: 2023-04-26 — End: 2023-10-26

## 2023-04-26 MED ORDER — METFORMIN HCL 1000 MG PO TABS
1000.0000 mg | ORAL_TABLET | Freq: Two times a day (BID) | ORAL | 3 refills | Status: DC
Start: 2023-04-26 — End: 2023-10-26

## 2023-04-26 NOTE — Assessment & Plan Note (Signed)
Repeat labs today. Chronic finding with no concerns at this time.

## 2023-04-26 NOTE — Assessment & Plan Note (Signed)
Repeat labs today for monitoring.

## 2023-04-26 NOTE — Assessment & Plan Note (Signed)
Controlled with daily PPI. No changes in therapy.

## 2023-04-26 NOTE — Assessment & Plan Note (Signed)
Chronic. BG well-controlled course. Associated with Hypertension and Hyperlipidemia. No symptoms present today. Currently managed with ozempic 1 mg and metformin. Foot Exam HM Status: was completed today. Urine Micro HM Status: was completed today. A1c HM Status: was completed today. CMP HM Status: was completed today. Lipids HM Status: is up to date. Eye Exam HM Status: is up to date. Plan: Glucose monitoring once daily with goals for AM fasting blood sugar 70-110 and 2 hours after meals blood sugar <150.  Diet should include 150-180 grams of carbohydrates, 20-30 grams of protein, 30 grams of fiber, and no more than 15 grams of saturated fat per day.  Exercise goals should include an average of 150 minutes of moderate intensity activity per week.  Blood pressure monitoring weekly with goal <130/80. Continue diabetes medications. Labs ordered. Plan to follow-up in 6months.

## 2023-04-26 NOTE — Assessment & Plan Note (Signed)
Repeat labs today for monitoring. Continue to work on diet and weight management

## 2023-04-26 NOTE — Assessment & Plan Note (Signed)
Chronic. BP stable No alarm symptoms present at this time.  Currently taking lisinopril as prescribed. Goal blood pressure less than less than 130/80. Currently is not followed with cardiology.  Plan: current treatment plan is effective, no change in therapy, orders and follow up as documented in EpicCare.  Refills have been provided today. Labs today to check electrolytes and kidney function.  Will make changes to plan of care as necessary based on lab results.  Plan to follow-up in 6months.

## 2023-04-26 NOTE — Assessment & Plan Note (Signed)
New onset of numbness and tingling in feet and legs over the past few months. No other associated symptoms reported. Her blood sugars have been stable. -Order Vitamin B12 level to rule out deficiency as a cause of neuropathy. -Continue to monitor symptoms.

## 2023-04-26 NOTE — Progress Notes (Signed)
[name], DNP, AGNP-c [facility]  [address] [place], [place] [address] [phone]  ESTABLISHED PATIENT- Chronic Health and/or Follow-Up Visit  Blood pressure 122/74, pulse 68, weight 162 lb 12.8 oz (73.8 kg).    Jeanne Shaw is a 67 y.o. year old female presenting today for evaluation and management of chronic conditions.   DM/HLD/HTN  The patient, with a history of diabetes, presents with new-onset numbness and tingling in the feet and legs over the past few months. She reports no other associated symptoms. The patient's most recent HbA1c was 7.4, and she denies any increased hunger, thirst, or urination. She reports variable nocturia, which she attributes to fluid intake before bedtime. She has been checking her blood sugar at least once a day and once a week before her injection. Numbers 120's-131.  The patient is currently on Ozempic, which she tolerates well. She denies any significant memory loss or cognitive changes, her sister told her this may be a side effect of this medication.   She also takes Omeprazole for reflux, which she reports is effective.  The patient has a history of cataract surgery and reports feeling different post-surgery, with occasional sensations of eyelids wanting to roll in. She denies any significant vision changes.  The patient also reports occasional itching in the lower legs when wearing knee-high socks, which is not associated with any new detergents or socks. She denies any significant swelling in the feet or ankles, but notes minor puffiness in the right foot.  The patient is also on Vitamin D and calcium supplements, taking approximately 1600mg  of calcium daily. She reports no falls in the past 12 months.  Blood pressure is stable. She does not routinely check this at home unless she feels something is off.  She is due for a urine micro today.  She is due for her 2nd Pna Vaccine and shingles vaccine. She  does not wish to have these completed. She tells me she does not wish to have any vaccines.  She is still taking vitamin D and Calcium  All ROS negative with exception of what is listed above.   PHYSICAL EXAM Physical Exam Vitals and nursing note reviewed.  Constitutional:      Appearance: Normal appearance.  HENT:     Head: Normocephalic.  Eyes:     Pupils: Pupils are equal, round, and reactive to light.  Neck:     Vascular: No carotid bruit.  Cardiovascular:     Rate and Rhythm: Normal rate and regular rhythm.     Pulses: Normal pulses.     Heart sounds: Normal heart sounds.  Pulmonary:     Effort: Pulmonary effort is normal.     Breath sounds: Normal breath sounds.  Musculoskeletal:        General: Normal range of motion.     Cervical back: Normal range of motion.     Thoracic back: Scoliosis present.  Skin:    General: Skin is warm.  Neurological:     General: No focal deficit present.     Mental Status: She is alert and oriented to person, place, and time.  Psychiatric:        Mood and Affect: Mood normal.     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 BG well-controlled course. Associated with Hypertension and Hyperlipidemia. No symptoms present today. Currently managed with ozempic 1 mg and metformin. Foot  Exam HM Status: was completed today. Urine Micro HM Status: was completed today. A1c HM Status: was completed today. CMP HM Status: was completed today. Lipids HM Status: is up to date. Eye Exam HM Status: is up to date. Plan: Glucose monitoring once daily with goals for AM fasting blood sugar 70-110 and 2 hours after meals blood sugar <150.  Diet should include 150-180 grams of carbohydrates, 20-30 grams of protein, 30 grams of fiber, and no more than 15 grams of saturated fat per day.  Exercise goals should include an average of 150 minutes of moderate intensity activity per  week.  Blood pressure monitoring weekly with goal <130/80. Continue diabetes medications. Labs ordered. Plan to follow-up in 6months.          Relevant Medications   metFORMIN (GLUCOPHAGE) 1000 MG tablet   simvastatin (ZOCOR) 10 MG tablet   Other Relevant Orders   CMP14+EGFR   Hemoglobin A1c   Microalbumin / creatinine urine ratio   CBC with Differential   Hypertension associated with type 2 diabetes mellitus (HCC)    Chronic. BP stable No alarm symptoms present at this time.  Currently taking lisinopril as prescribed. Goal blood pressure less than less than 130/80. Currently is not followed with cardiology.  Plan: current treatment plan is effective, no change in therapy, orders and follow up as documented in EpicCare.  Refills have been provided today. Labs today to check electrolytes and kidney function.  Will make changes to plan of care as necessary based on lab results.  Plan to follow-up in 6months.       Relevant Medications   metFORMIN (GLUCOPHAGE) 1000 MG tablet   simvastatin (ZOCOR) 10 MG tablet   Other Relevant Orders   CMP14+EGFR   Hemoglobin A1c   Microalbumin / creatinine urine ratio   CBC with Differential   Hyperlipidemia associated with type 2 diabetes mellitus (HCC)    Chronic. Lipids stable. No alarm symptoms present at this time. LDL Goal < 70. Controlled with  simvastatin .  Plan: Continue current lipid-lowering therapy. labs are reviewed, up to date and normal Recommend Treatment of diabetes  and Treatment of hypertension  Diet and exercise recommendations provided.  Follow-up in 6months or sooner based on lab findings as appropriate.         Relevant Medications   metFORMIN (GLUCOPHAGE) 1000 MG tablet   simvastatin (ZOCOR) 10 MG tablet   Obesity (BMI 30.0-34.9)    BMI stable. Continue Ozempic      Relevant Medications   metFORMIN (GLUCOPHAGE) 1000 MG tablet   simvastatin (ZOCOR) 10 MG tablet   Elevated LFTs    Repeat labs today for  monitoring. Continue to work on diet and weight management      Relevant Orders   CMP14+EGFR   Gastroesophageal reflux disease    Controlled with daily PPI. No changes in therapy.       Relevant Medications   omeprazole (PRILOSEC) 40 MG capsule   Abnormal platelets (HCC)    Repeat labs today. Chronic finding with no concerns at this time.       Relevant Orders   CMP14+EGFR   Hemoglobin A1c   CBC with Differential   Numbness and tingling of foot    New onset of numbness and tingling in feet and legs over the past few months. No other associated symptoms reported. Her blood sugars have been stable. -Order Vitamin B12 level to rule out deficiency as a cause of neuropathy. -Continue to monitor symptoms.  Relevant Orders   CMP14+EGFR   Hemoglobin A1c   Microalbumin / creatinine urine ratio   Vitamin B12   CBC with Differential   Serum potassium elevated    Repeat labs today for monitoring.       Relevant Orders   CMP14+EGFR   Other Visit Diagnoses     Essential hypertension       Relevant Medications   metFORMIN (GLUCOPHAGE) 1000 MG tablet   simvastatin (ZOCOR) 10 MG tablet   Mixed hyperlipidemia       Relevant Medications   metFORMIN (GLUCOPHAGE) 1000 MG tablet   simvastatin (ZOCOR) 10 MG tablet   Vitamin D deficiency       Relevant Medications   Cholecalciferol (VITAMIN D) 125 MCG (5000 UT) CAPS       Return in about 6 months (around 10/24/2023) for Med Management 30.   [name], DNP, AGNP-c

## 2023-04-26 NOTE — Patient Instructions (Signed)
Diabetes I recommend that you check your blood sugar at least once every morning before eating and write down this number to bring with you to your follow-up visits so we can go over them.  If your blood sugar is higher than 150 fasting for three readings, please let me know so we can adjust your medications.  It is always a good idea to also check your blood pressure in the mornings, as well, as diabetes and high blood pressure can go hand in hand.  Your goal blood pressure is less than 130/80.  If your blood pressure is higher than your goal for three readings in a row, please let me know so we can adjust your medications.  I recommend daily physical activity of at least 20 minutes to help improve your heart health and help your body use the excess sugar. I recommend no more than 1200-1500 calories per day with no more than 150 grams of carbohydrates.

## 2023-04-26 NOTE — Assessment & Plan Note (Signed)
BMI stable. Continue Ozempic

## 2023-04-26 NOTE — Assessment & Plan Note (Signed)
Chronic. Lipids stable. No alarm symptoms present at this time. LDL Goal < 70. Controlled with  simvastatin .  Plan: Continue current lipid-lowering therapy. labs are reviewed, up to date and normal Recommend Treatment of diabetes  and Treatment of hypertension  Diet and exercise recommendations provided.  Follow-up in 6months or sooner based on lab findings as appropriate.

## 2023-04-27 LAB — CBC WITH DIFFERENTIAL/PLATELET
Basophils Absolute: 0.1 10*3/uL (ref 0.0–0.2)
Basos: 1 %
EOS (ABSOLUTE): 0.4 10*3/uL (ref 0.0–0.4)
Eos: 4 %
Hematocrit: 44.3 % (ref 34.0–46.6)
Hemoglobin: 14.4 g/dL (ref 11.1–15.9)
Immature Grans (Abs): 0 10*3/uL (ref 0.0–0.1)
Immature Granulocytes: 0 %
Lymphocytes Absolute: 2.8 10*3/uL (ref 0.7–3.1)
Lymphs: 30 %
MCH: 30.8 pg (ref 26.6–33.0)
MCHC: 32.5 g/dL (ref 31.5–35.7)
MCV: 95 fL (ref 79–97)
Monocytes Absolute: 0.4 10*3/uL (ref 0.1–0.9)
Monocytes: 4 %
Neutrophils Absolute: 5.8 10*3/uL (ref 1.4–7.0)
Neutrophils: 61 %
Platelets: 562 10*3/uL — ABNORMAL HIGH (ref 150–450)
RBC: 4.68 x10E6/uL (ref 3.77–5.28)
RDW: 12.5 % (ref 11.7–15.4)
WBC: 9.5 10*3/uL (ref 3.4–10.8)

## 2023-04-27 LAB — MICROALBUMIN / CREATININE URINE RATIO
Creatinine, Urine: 110.5 mg/dL
Microalb/Creat Ratio: 6 mg/g{creat} (ref 0–29)
Microalbumin, Urine: 6.8 ug/mL

## 2023-04-27 LAB — HEMOGLOBIN A1C
Est. average glucose Bld gHb Est-mCnc: 148 mg/dL
Hgb A1c MFr Bld: 6.8 % — ABNORMAL HIGH (ref 4.8–5.6)

## 2023-04-27 LAB — CMP14+EGFR
ALT: 27 IU/L (ref 0–32)
AST: 18 IU/L (ref 0–40)
Albumin: 4.3 g/dL (ref 3.9–4.9)
Alkaline Phosphatase: 87 IU/L (ref 44–121)
BUN/Creatinine Ratio: 23 (ref 12–28)
BUN: 14 mg/dL (ref 8–27)
Bilirubin Total: 0.3 mg/dL (ref 0.0–1.2)
CO2: 24 mmol/L (ref 20–29)
Calcium: 10.4 mg/dL — ABNORMAL HIGH (ref 8.7–10.3)
Chloride: 102 mmol/L (ref 96–106)
Creatinine, Ser: 0.6 mg/dL (ref 0.57–1.00)
Globulin, Total: 2.1 g/dL (ref 1.5–4.5)
Glucose: 124 mg/dL — ABNORMAL HIGH (ref 70–99)
Potassium: 5.2 mmol/L (ref 3.5–5.2)
Sodium: 141 mmol/L (ref 134–144)
Total Protein: 6.4 g/dL (ref 6.0–8.5)
eGFR: 98 mL/min/{1.73_m2} (ref >59)

## 2023-04-27 LAB — VITAMIN B12: Vitamin B-12: 246 pg/mL (ref 232–1245)

## 2023-05-04 ENCOUNTER — Other Ambulatory Visit (HOSPITAL_BASED_OUTPATIENT_CLINIC_OR_DEPARTMENT_OTHER): Payer: Self-pay | Admitting: Nurse Practitioner

## 2023-05-04 DIAGNOSIS — E782 Mixed hyperlipidemia: Secondary | ICD-10-CM

## 2023-05-04 DIAGNOSIS — I10 Essential (primary) hypertension: Secondary | ICD-10-CM

## 2023-05-04 DIAGNOSIS — E118 Type 2 diabetes mellitus with unspecified complications: Secondary | ICD-10-CM

## 2023-05-04 DIAGNOSIS — E66811 Obesity, class 1: Secondary | ICD-10-CM

## 2023-05-07 ENCOUNTER — Encounter: Payer: Self-pay | Admitting: Nurse Practitioner

## 2023-05-10 ENCOUNTER — Other Ambulatory Visit: Payer: Self-pay

## 2023-05-10 ENCOUNTER — Other Ambulatory Visit (INDEPENDENT_AMBULATORY_CARE_PROVIDER_SITE_OTHER): Payer: Medicare Other

## 2023-05-10 DIAGNOSIS — E538 Deficiency of other specified B group vitamins: Secondary | ICD-10-CM

## 2023-05-10 MED ORDER — CYANOCOBALAMIN 1000 MCG/ML IJ SOLN: 1000.0000 ug | Freq: Once | INTRAMUSCULAR | 5 refills | Status: AC

## 2023-05-10 MED ORDER — "LUER LOCK SAFETY SYRINGES 25G X 5/8"" 3 ML MISC": 1.0000 | 0 refills | Status: AC

## 2023-05-10 MED ORDER — CYANOCOBALAMIN 1000 MCG/ML IJ SOLN
1000.0000 ug | Freq: Once | INTRAMUSCULAR | Status: AC
Start: 2023-05-10 — End: 2023-05-10
  Administered 2023-05-10: 1000 ug via INTRAMUSCULAR

## 2023-05-17 ENCOUNTER — Encounter: Payer: Self-pay | Admitting: Nurse Practitioner

## 2023-05-17 NOTE — Progress Notes (Signed)
[facility] - Clinic Note  05/24/2023     CHIEF COMPLAINT Patient presents for Retina Follow Up  HISTORY OF PRESENT ILLNESS: Jeanne Shaw is a 67 y.o. female who presents to the clinic today for:   HPI     Retina Follow Up   Patient presents with  Dry AMD.  In both eyes.  Severity is moderate.  Duration of 8 months.  Since onset it is stable.  I, the attending physician,  performed the HPI with the patient and updated documentation appropriately.        Comments   Pt here for 8 mo ret f/u non exu ARMD OU. Pt states VA is stable, no changes. Pt did experience a 'quarter moon shape gray shadow' in OD a few weeks ago that did resolve. Lasted no longer than a week. Most recent A1C was 6.8. Using lubricant eye drops as needed OU.       Last edited by Rennis Chris, MD on 05/24/2023  8:25 AM.    Pt states vision is stable, she states a few weeks ago, she was unloading pumpkins from a trailer and felt pressure on her chest and was short of breath, she feels like she was dehydrated and her blood sugar was low   Referring physician: Tollie Eth, NP [address] [place],  [place] [address]  HISTORICAL INFORMATION:   Selected notes from the MEDICAL RECORD NUMBER Referred by M. Dixon, PA-C for DM exam Ocular Hx-  PMH- DM (A1C 7.4 on 04.01.19), HTN, asthma,     CURRENT MEDICATIONS: Current Outpatient Medications (Ophthalmic Drugs)  Medication Sig   Polyethyl Glycol-Propyl Glycol (SYSTANE) 0.4-0.3 % SOLN Apply 1 drop to eye daily.   No current facility-administered medications for this visit. (Ophthalmic Drugs)   Current Outpatient Medications (Other)  Medication Sig   Ascorbic Acid (VITAMIN C) 1000 MG tablet Take 1,000 mg by mouth daily.   aspirin EC 81 MG tablet Take 81 mg by mouth daily.   Cholecalciferol (VITAMIN D) 125 MCG (5000 UT) CAPS Take 5,000 Units by mouth daily.   estradiol (ESTRACE) 0.1 MG/GM vaginal cream Place 0.5g  nightly for two weeks then twice a week after   fexofenadine (EQ ALLERGY RELIEF) 180 MG tablet TAKE 1 TABLET BY MOUTH ONCE DAILY FOR ALLERGIES   Flaxseed, Linseed, (FLAX SEEDS PO) Take 1 tablet by mouth daily.    lisinopril (ZESTRIL) 10 MG tablet Take 1 tablet by mouth once daily   metFORMIN (GLUCOPHAGE) 1000 MG tablet Take 1 tablet (1,000 mg total) by mouth 2 (two) times daily with a meal. TAKE 1 TABLET BY MOUTH TWICE DAILY WITH A MEAL   Multiple Vitamins-Minerals (PRESERVISION AREDS) CAPS Take 1 capsule by mouth at bedtime.    omeprazole (PRILOSEC) 40 MG capsule Take 40 mg by mouth daily.   Red Yeast Rice Extract (RED YEAST RICE PO) Take 1 tablet by mouth daily.    Semaglutide, 1 MG/DOSE, (OZEMPIC, 1 MG/DOSE,) 4 MG/3ML SOPN INJECT 1 MG AS DIRECTED ONCE A WEEK   simvastatin (ZOCOR) 10 MG tablet Take 1 tablet (10 mg total) by mouth at bedtime.   sodium chloride (OCEAN) 0.65 % SOLN nasal spray Place 1 spray into both nostrils daily as needed for congestion.   SYRINGE-NEEDLE, DISP, 3 ML (LUER LOCK SAFETY SYRINGES) 25G X 5/8" 3 ML MISC 1 Needle by Does not apply route every 30 (thirty) days.   Zinc 50 MG CAPS    No current facility-administered medications  for this visit. (Other)   REVIEW OF SYSTEMS: ROS   Positive for: Gastrointestinal, Skin, Endocrine, Eyes, Respiratory Negative for: Constitutional, Neurological, Genitourinary, Musculoskeletal, HENT, Cardiovascular, Psychiatric, Allergic/Imm, Heme/Lymph Last edited by Thompson Grayer, COT on 05/24/2023  7:47 AM.      ALLERGIES Allergies  Allergen Reactions   Marcelline Deist [Dapagliflozin] Other (See Comments)    Mycotic infections   Hydrocodone Anxiety   PAST MEDICAL HISTORY Past Medical History:  Diagnosis Date   Acute swimmer's ear of right side 11/24/2021   Allergy    Anxiety    Asthma    Cataract    OU   Cervicogenic headache 04/08/2022   Diabetes mellitus without complication (HCC)    Encounter for medical examination to  establish care 04/25/2021   GERD (gastroesophageal reflux disease)    Hypertension    Leg pain, posterior, left 04/25/2021   Macular degeneration    Dry OU   Neck and shoulder pain 04/08/2022   Osteopenia    Pulsatile tinnitus of left ear 11/13/2022   Rosacea    Subacute vaginitis 04/25/2021   Wheezing 08/25/2022   Past Surgical History:  Procedure Laterality Date   BIOPSY  03/26/2020   Procedure: BIOPSY;  Surgeon: Dolores Frame, MD;  [facility]: [facility];  Service: Gastroenterology;;   COLONOSCOPY WITH PROPOFOL N/A 03/26/2020   Procedure: COLONOSCOPY WITH PROPOFOL;  Surgeon: Dolores Frame, MD;  Location: [facility];  Service: Gastroenterology;  Laterality: N/A;  815   CYST EXCISION     off vocal cord    POLYPECTOMY  03/26/2020   Procedure: POLYPECTOMY;  Surgeon: Marguerita Merles, Reuel Boom, MD;  Location: [facility];  Service: Gastroenterology;;    FAMILY HISTORY Family History  Problem Relation Age of Onset   COPD Mother    Alcohol abuse Father    Diabetes Father    Heart disease Father    Hyperlipidemia Father    Hypertension Father    Cancer Sister    Diabetes Sister    Hyperlipidemia Sister    Hypertension Sister    Breast cancer Sister 59   Hypertension Brother    Alcohol abuse Maternal Aunt    Alcohol abuse Maternal Uncle    Hypertension Maternal Uncle    Alcohol abuse Paternal Aunt    Cancer Paternal Aunt    Alcohol abuse Paternal Uncle    Cancer Paternal Uncle    Diabetes Maternal Grandmother    Stroke Maternal Grandmother    Heart disease Maternal Grandfather    SOCIAL HISTORY Social History   Tobacco Use   Smoking status: Former    Current packs/day: 0.00    Average packs/day: 0.3 packs/day for 30.0 years (7.5 ttl pk-yrs)    Types: Cigarettes    Start date: 08/02/1985    Quit date: 08/03/2015    Years since quitting: 7.8   Smokeless tobacco: Former  Building services engineer status: Never Used  Substance Use Topics    Alcohol use: Yes    Alcohol/week: 12.0 standard drinks of alcohol    Types: 12 Glasses of wine per week    Comment: beer occassionally   Drug use: No       OPHTHALMIC EXAM:  Base Eye Exam     Visual Acuity (Snellen - Linear)       Right Left   Dist [place] 20/20 20/25 -1   Dist ph [place]  NI         Tonometry (Tonopen, 7:51 AM)  Right Left   Pressure 15 15         Pupils       Pupils Dark Light Shape React APD   Right PERRL 3 2 Round Brisk None   Left PERRL 3 2 Round Brisk None         Visual Fields (Counting fingers)       Left Right    Full Full         Extraocular Movement       Right Left    Full, Ortho Full, Ortho         Neuro/Psych     Oriented x3: Yes   Mood/Affect: Normal         Dilation     Both eyes: 1.0% Mydriacyl, 2.5% Phenylephrine @ 7:52 AM           Slit Lamp and Fundus Exam     Slit Lamp Exam       Right Left   Lids/Lashes Dermatochalasis - upper lid Dermatochalasis - upper lid   Conjunctiva/Sclera nasal and temporal pinguecula nasal and temporal pinguecula   Cornea well healed cataract wound, tear film debris, trace PEE well healed cataract wound, mild tear film debris   Anterior Chamber Deep and quiet Deep and quiet   Iris Round and dilated, No NVI Round and dilated, No NVI   Lens Toric PC IOL in good position with marks at 0400 and 1000, trace posterior capsular opacification PC IOL in good position, trace posterior capsular opacification   Anterior Vitreous Mild syneresis Mild syneresis         Fundus Exam       Right Left   Disc Pink and Sharp Pink and Sharp, Compact   C/D Ratio 0.3 0.2   Macula flat, blunted foveal reflex, mild RPE mottling and clumping, focal druse/pigment clump superonasal to fovea, No heme or edema Flat, Blunted foveal reflex, drusen, mild RPE mottling, clumping and early atrophy, +PEDs, No heme or edema   Vessels attenuated, Tortuous, mild copper wiring attenuated, mild tortuosity    Periphery Attached, focal peripheral drusen nasally, reticular degeneration, No heme Attached, patch of pavingstone at 0600, mild reticular degeneration, No heme           IMAGING AND PROCEDURES  Imaging and Procedures for 11/16/17  OCT, Retina - OU - Both Eyes       Right Eye Quality was good. Central Foveal Thickness: 300. Progression has been stable. Findings include normal foveal contour, no IRF, no SRF, retinal drusen , outer retinal atrophy, vitreomacular adhesion .   Left Eye Quality was good. Central Foveal Thickness: 309. Progression has been stable. Findings include normal foveal contour, no IRF, no SRF, retinal drusen , pigment epithelial detachment, outer retinal atrophy, vitreomacular adhesion (Patchy ORA centrally).   Notes *Images captured and stored on drive  Diagnosis / Impression: NFP, No IRF/SRF OU No DME OU Nonexudative ARMD OU OS: patchy ORA centrally  Clinical management:  See below  Abbreviations: NFP - Normal foveal profile. CME - cystoid macular edema. PED - pigment epithelial detachment. IRF - intraretinal fluid. SRF - subretinal fluid. EZ - ellipsoid zone. ERM - epiretinal membrane. ORA - outer retinal atrophy. ORT - outer retinal tubulation. SRHM - subretinal hyper-reflective material              ASSESSMENT/PLAN:    ICD-10-CM   1. Intermediate stage nonexudative age-related macular degeneration of both eyes  H35.3132 OCT, Retina - OU - Both Eyes  2. 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 Long term (current) use of oral hypoglycemic drugs  Z79.84     4. Long-term (current) use of injectable non-insulin antidiabetic drugs  Z79.85     5. Essential hypertension  I10     6. Hypertensive retinopathy of both eyes  H35.033     7. Pseudophakia, both eyes  Z96.1      1. Non-Exudative ARMD OU  - Intermediate stage (OS > OD)  - scattered drusen and early ORA  - OCT shows patchy OU (OS > OD)  - BCVA OD 20/20,  OS 20/25 from 20/20  - continue amsler grid monitoring  - f/u 6-9 months for repeat DFE/OCT  2-4. Diabetes mellitus, type 2 without retinopathy  - last a1c 6.8 on 09.23.24  - The incidence, risk factors for progression, natural history and treatment options for diabetic retinopathy  were discussed with patient.    - The need for close monitoring of blood glucose, blood pressure, and serum lipids, avoiding cigarette or any type of tobacco, and the need for long term follow up was also discussed with patient.  - monitor   5,6. Hypertensive retinopathy OU - discussed importance of tight BP control - monitor  7. Pseudophakia OU  - s/p CE/IOL (Dr. Dione Booze: OD (toric): 09.14.23, OS: 08.31.23)  - IOLs in good position, doing well  - monitor  Ophthalmic Meds Ordered this visit:  No orders of the defined types were placed in this encounter.    Return for f/u 6-9 months, non-exu ARMD OU, DFE, OCT.  There are no Patient Instructions on file for this visit.   Explained the diagnoses, plan, and follow up with the patient and they expressed understanding.  Patient expressed understanding of the importance of proper follow up care.   This document serves as a record of services personally performed by Karie Chimera, MD, PhD. It was created on their behalf by Annalee Genta, COMT. The creation of this record is the provider's dictation and/or activities during the visit.  Electronically signed by: Annalee Genta, COMT 05/24/23 8:25 AM  This document serves as a record of services personally performed by Karie Chimera, MD, PhD. It was created on their behalf by Glee Arvin. Manson Passey, OA an ophthalmic technician. The creation of this record is the provider's dictation and/or activities during the visit.    Electronically signed by: Glee Arvin. Manson Passey, OA 05/24/23 8:25 AM  Karie Chimera, M.D., Ph.D. Diseases & Surgery of the Retina and Vitreous [facility]  I have reviewed the  above documentation for accuracy and completeness, and I agree with the above. Karie Chimera, M.D., Ph.D. 05/24/23 8:26 AM   Abbreviations: M myopia (nearsighted); A astigmatism; H hyperopia (farsighted); P presbyopia; Mrx spectacle prescription;  CTL contact lenses; OD right eye; OS left eye; OU both eyes  XT exotropia; ET esotropia; PEK punctate epithelial keratitis; PEE punctate epithelial erosions; DES dry eye syndrome; MGD meibomian gland dysfunction; ATs artificial tears; PFAT's preservative free artificial tears; NSC nuclear sclerotic cataract; PSC posterior subcapsular cataract; ERM epi-retinal membrane; PVD posterior vitreous detachment; RD retinal detachment; DM diabetes mellitus; DR diabetic retinopathy; NPDR non-proliferative diabetic retinopathy; PDR proliferative diabetic retinopathy; CSME clinically significant macular edema; DME diabetic macular edema; dbh dot blot hemorrhages; CWS cotton wool spot; POAG primary open angle glaucoma; C/D cup-to-disc ratio; HVF humphrey visual field; GVF goldmann visual field; OCT optical coherence tomography; IOP intraocular pressure;  BRVO Branch retinal vein occlusion; CRVO central retinal vein occlusion; CRAO central retinal artery occlusion; BRAO branch retinal artery occlusion; RT retinal tear; SB scleral buckle; PPV pars plana vitrectomy; VH Vitreous hemorrhage; PRP panretinal laser photocoagulation; IVK intravitreal kenalog; VMT vitreomacular traction; MH Macular hole;  NVD neovascularization of the disc; NVE neovascularization elsewhere; AREDS age related eye disease study; ARMD age related macular degeneration; POAG primary open angle glaucoma; EBMD epithelial/anterior basement membrane dystrophy; ACIOL anterior chamber intraocular lens; IOL intraocular lens; PCIOL posterior chamber intraocular lens; Phaco/IOL phacoemulsification with intraocular lens placement; PRK photorefractive keratectomy; LASIK laser assisted in situ keratomileusis; HTN  hypertension; DM diabetes mellitus; COPD chronic obstructive pulmonary disease

## 2023-05-18 ENCOUNTER — Encounter: Payer: Self-pay | Admitting: Nurse Practitioner

## 2023-05-24 ENCOUNTER — Encounter (INDEPENDENT_AMBULATORY_CARE_PROVIDER_SITE_OTHER): Payer: Self-pay | Admitting: Ophthalmology

## 2023-05-24 ENCOUNTER — Ambulatory Visit (INDEPENDENT_AMBULATORY_CARE_PROVIDER_SITE_OTHER): Payer: Medicare Other | Admitting: Ophthalmology

## 2023-05-24 ENCOUNTER — Encounter: Payer: Self-pay | Admitting: Nurse Practitioner

## 2023-05-24 DIAGNOSIS — I10 Essential (primary) hypertension: Secondary | ICD-10-CM

## 2023-05-24 DIAGNOSIS — E119 Type 2 diabetes mellitus without complications: Secondary | ICD-10-CM | POA: Diagnosis not present

## 2023-05-24 DIAGNOSIS — Z7984 Long term (current) use of oral hypoglycemic drugs: Secondary | ICD-10-CM

## 2023-05-24 DIAGNOSIS — H353132 Nonexudative age-related macular degeneration, bilateral, intermediate dry stage: Secondary | ICD-10-CM | POA: Diagnosis not present

## 2023-05-24 DIAGNOSIS — Z961 Presence of intraocular lens: Secondary | ICD-10-CM

## 2023-05-24 DIAGNOSIS — Z7985 Long-term (current) use of injectable non-insulin antidiabetic drugs: Secondary | ICD-10-CM

## 2023-05-24 DIAGNOSIS — H35033 Hypertensive retinopathy, bilateral: Secondary | ICD-10-CM

## 2023-05-26 ENCOUNTER — Encounter: Payer: Self-pay | Admitting: Nurse Practitioner

## 2023-05-27 ENCOUNTER — Ambulatory Visit: Payer: Medicare Other | Admitting: Nurse Practitioner

## 2023-05-27 ENCOUNTER — Encounter: Payer: Self-pay | Admitting: Nurse Practitioner

## 2023-05-27 VITALS — BP 120/70 | HR 83 | Wt 160.6 lb

## 2023-05-27 DIAGNOSIS — G4489 Other headache syndrome: Secondary | ICD-10-CM | POA: Diagnosis not present

## 2023-05-27 DIAGNOSIS — E118 Type 2 diabetes mellitus with unspecified complications: Secondary | ICD-10-CM | POA: Diagnosis not present

## 2023-05-27 DIAGNOSIS — E162 Hypoglycemia, unspecified: Secondary | ICD-10-CM

## 2023-05-27 DIAGNOSIS — E1159 Type 2 diabetes mellitus with other circulatory complications: Secondary | ICD-10-CM

## 2023-05-27 DIAGNOSIS — I152 Hypertension secondary to endocrine disorders: Secondary | ICD-10-CM

## 2023-05-27 MED ORDER — FREESTYLE LIBRE 3 PLUS SENSOR MISC: 5 refills | Status: DC

## 2023-05-27 NOTE — Progress Notes (Signed)
Tollie Eth, DNP, AGNP-c [facility] [address] [place], [place] [address] [phone]   ACUTE VISIT- ESTABLISHED PATIENT  Blood pressure 120/70, pulse 83, weight 160 lb 9.6 oz (72.8 kg).  Subjective:  HPI Jeanne Shaw is a 67 y.o. female presents to day for evaluation of acute concern(s).   History of Present Illness Kasie has a history of diabetes managed with Ozempic and Metformin. She presented with concerns about episodes of lightheadedness, chest pressure radiating to the collarbone, low blood sugar readings, and frequent headaches. These symptoms were first noticed approximately two weeks ago while she was engaged in physical activity, unloading pumpkins. She reported feeling "wonky" and "not right," with symptoms subsiding when at rest.  She also reported experiencing low blood sugar readings, which were unusual for her. She noted that these episodes of hypoglycemia were often associated with physical activity, even as mild as mopping the floor. She also reported occasional episodes of feeling hot, which she associated with low blood sugar levels.  In addition to these symptoms, she reported an increase in hand tremors, which she has experienced since childhood. She also reported frequent headaches, which were unusual for her.  Her blood pressure readings have been inconsistent, with some readings as low as 102/62, which is lower than her usual range of high 120s to 130s. She also reported occasional spikes in blood pressure, with readings in the 140s.  She has been proactive in managing her health, including monitoring her blood sugar levels and blood pressure at home. She has also made dietary changes, including reducing alcohol intake. Despite these efforts, her symptoms have persisted, leading to the current consultation.  ROS negative except for what is listed in HPI. History, Medications, Surgery, SDOH, and Family History reviewed and  updated as appropriate.  Objective:  Physical Exam Vitals and nursing note reviewed.  Constitutional:      Appearance: Normal appearance.  HENT:     Head: Normocephalic.  Eyes:     Pupils: Pupils are equal, round, and reactive to light.  Cardiovascular:     Rate and Rhythm: Normal rate and regular rhythm.     Pulses: Normal pulses.     Heart sounds: Normal heart sounds.  Pulmonary:     Effort: Pulmonary effort is normal.     Breath sounds: Normal breath sounds.  Musculoskeletal:        General: Normal range of motion.     Cervical back: Normal range of motion.  Skin:    General: Skin is warm.  Neurological:     General: No focal deficit present.     Mental Status: She is alert and oriented to person, place, and time.  Psychiatric:        Mood and Affect: Mood normal.          Assessment & Plan:   Problem List Items Addressed This Visit     DM (diabetes mellitus), type 2 with complications (HCC) - Primary    Chronic diabetes with recent episodes of low blood sugar associated with physical activity. Symptoms also include feeling lightheaded and pressure in her chest. She reports with these episodes when checking her blood sugars they have been as low as the 70's, which is very unusual for her. She is currently on Ozempic and Metformin for management.  -Reduce metformin to once daily dosing, it may be better to take the one dose in the morning [facility] [place] for continuous blood sugar monitoring given the low readings and symptoms  experienced.  -Report any new or worsening symptoms or continued low blood sugar readings.      Relevant Medications   Continuous Glucose Sensor (FREESTYLE LIBRE 3 PLUS SENSOR) MISC   Hypertension associated with type 2 diabetes mellitus (HCC)    Blood pressure readings have been inconsistent, with some readings lower than usual. These appear to correlated with episodes of hypoglycemia.  -Continue current management and monitor blood  pressure regularly. -Notify if blood pressures are consistently staying low.       Hypoglycemia   Relevant Medications   Continuous Glucose Sensor (FREESTYLE LIBRE 3 PLUS SENSOR) MISC   Other headache syndrome    Frequent headaches, possibly related to blood sugar fluctuations or posture during reading. No alarm symptoms present.  -Advise to monitor headaches in relation to blood sugar levels and posture. -If headaches worsen, please let me know immediately.          Tollie Eth, DNP, AGNP-c

## 2023-05-27 NOTE — Patient Instructions (Signed)
Drop your metformin to once a day in the morning.

## 2023-05-27 NOTE — Assessment & Plan Note (Signed)
Blood pressure readings have been inconsistent, with some readings lower than usual. These appear to correlated with episodes of hypoglycemia.  -Continue current management and monitor blood pressure regularly. -Notify if blood pressures are consistently staying low.

## 2023-05-27 NOTE — Assessment & Plan Note (Signed)
Chronic diabetes with recent episodes of low blood sugar associated with physical activity. Symptoms also include feeling lightheaded and pressure in her chest. She reports with these episodes when checking her blood sugars they have been as low as the 70's, which is very unusual for her. She is currently on Ozempic and Metformin for management.  -Reduce metformin to once daily dosing, it may be better to take the one dose in the morning [facility] [place] for continuous blood sugar monitoring given the low readings and symptoms experienced.  -Report any new or worsening symptoms or continued low blood sugar readings.

## 2023-05-27 NOTE — Assessment & Plan Note (Signed)
Frequent headaches, possibly related to blood sugar fluctuations or posture during reading. No alarm symptoms present.  -Advise to monitor headaches in relation to blood sugar levels and posture. -If headaches worsen, please let me know immediately.

## 2023-06-07 ENCOUNTER — Encounter: Payer: Self-pay | Admitting: Nurse Practitioner

## 2023-06-09 ENCOUNTER — Other Ambulatory Visit: Payer: Self-pay

## 2023-06-09 DIAGNOSIS — E162 Hypoglycemia, unspecified: Secondary | ICD-10-CM

## 2023-06-09 DIAGNOSIS — E118 Type 2 diabetes mellitus with unspecified complications: Secondary | ICD-10-CM

## 2023-06-09 MED ORDER — FREESTYLE LIBRE 3 PLUS SENSOR MISC: 5 refills | Status: DC

## 2023-06-22 ENCOUNTER — Other Ambulatory Visit: Payer: Self-pay | Admitting: Nurse Practitioner

## 2023-06-22 DIAGNOSIS — R7989 Other specified abnormal findings of blood chemistry: Secondary | ICD-10-CM

## 2023-06-22 DIAGNOSIS — E118 Type 2 diabetes mellitus with unspecified complications: Secondary | ICD-10-CM

## 2023-06-22 DIAGNOSIS — E782 Mixed hyperlipidemia: Secondary | ICD-10-CM

## 2023-06-22 DIAGNOSIS — I10 Essential (primary) hypertension: Secondary | ICD-10-CM

## 2023-06-22 DIAGNOSIS — E66811 Obesity, class 1: Secondary | ICD-10-CM

## 2023-06-23 ENCOUNTER — Encounter: Payer: Self-pay | Admitting: Family Medicine

## 2023-06-23 ENCOUNTER — Ambulatory Visit: Payer: Medicare Other | Admitting: Family Medicine

## 2023-06-23 ENCOUNTER — Encounter: Payer: Self-pay | Admitting: Nurse Practitioner

## 2023-06-23 VITALS — BP 128/68 | HR 88 | Temp 98.0°F | Ht 61.0 in | Wt 164.0 lb

## 2023-06-23 DIAGNOSIS — R350 Frequency of micturition: Secondary | ICD-10-CM | POA: Diagnosis not present

## 2023-06-23 DIAGNOSIS — N3000 Acute cystitis without hematuria: Secondary | ICD-10-CM | POA: Diagnosis not present

## 2023-06-23 LAB — POCT URINALYSIS DIP (PROADVANTAGE DEVICE)
Bilirubin, UA: NEGATIVE
Blood, UA: NEGATIVE
Glucose, UA: NEGATIVE mg/dL
Ketones, POC UA: NEGATIVE mg/dL
Nitrite, UA: NEGATIVE
Protein Ur, POC: NEGATIVE mg/dL
Specific Gravity, Urine: 1.01
Urobilinogen, Ur: 0.2
pH, UA: 6 (ref 5.0–8.0)

## 2023-06-23 MED ORDER — NITROFURANTOIN MONOHYD MACRO 100 MG PO CAPS
100.0000 mg | ORAL_CAPSULE | Freq: Two times a day (BID) | ORAL | 0 refills | Status: DC
Start: 2023-06-23 — End: 2023-10-26

## 2023-06-23 NOTE — Progress Notes (Unsigned)
Chief Complaint  Patient presents with   Urinary Frequency    Has been having urinary frequency x 3 days as well as lower abdominal discomfort. Slight burning.    3 days she started with suprapubic discomfort and urinary frequency.  Also having urgency, small amounts.  Urine seems cloudy, yellow, and some odor. She has had some cranberry juice, and continues to drink lots of water. Today she noticed more discomfort with voiding.   H/o UTI, one in the last year or 2. This feels similar to prior UTI.  Monogamous relationship with husband. No vaginal discharge  PMH, PSH, SH reviewed  DM Lab Results  Component Value Date   HGBA1C 6.8 (H) 04/26/2023   Outpatient Encounter Medications as of 06/23/2023  Medication Sig Note   Ascorbic Acid (VITAMIN C) 1000 MG tablet Take 1,000 mg by mouth daily.    aspirin EC 81 MG tablet Take 81 mg by mouth daily.    Cholecalciferol (VITAMIN D) 125 MCG (5000 UT) CAPS Take 5,000 Units by mouth daily.    fexofenadine (EQ ALLERGY RELIEF) 180 MG tablet TAKE 1 TABLET BY MOUTH ONCE DAILY FOR ALLERGIES    metFORMIN (GLUCOPHAGE) 1000 MG tablet Take 1 tablet (1,000 mg total) by mouth 2 (two) times daily with a meal. TAKE 1 TABLET BY MOUTH TWICE DAILY WITH A MEAL 06/23/2023: Once daily   Multiple Vitamins-Minerals (ICAPS AREDS 2 PO) Take by mouth.    omeprazole (PRILOSEC) 40 MG capsule Take 40 mg by mouth daily.    Polyethyl Glycol-Propyl Glycol (SYSTANE) 0.4-0.3 % SOLN Apply 1 drop to eye daily.    Red Yeast Rice Extract (RED YEAST RICE PO) Take 1 tablet by mouth daily.     Semaglutide, 1 MG/DOSE, (OZEMPIC, 1 MG/DOSE,) 4 MG/3ML SOPN INJECT 1 MG AS DIRECTED ONCE A WEEK 06/23/2023: Takes on Friday   simvastatin (ZOCOR) 10 MG tablet Take 1 tablet (10 mg total) by mouth at bedtime.    SYRINGE-NEEDLE, DISP, 3 ML (LUER LOCK SAFETY SYRINGES) 25G X 5/8" 3 ML MISC 1 Needle by Does not apply route every 30 (thirty) days.    Zinc 50 MG CAPS     Continuous Glucose Sensor  (FREESTYLE LIBRE 3 PLUS SENSOR) MISC Change sensor every 15 days. (Patient not taking: Reported on 06/23/2023)    estradiol (ESTRACE) 0.1 MG/GM vaginal cream Place 0.5g nightly for two weeks then twice a week after (Patient not taking: Reported on 06/23/2023) 06/23/2023: Uses rarely   Flaxseed, Linseed, (FLAX SEEDS PO) Take 1 tablet by mouth daily.  (Patient not taking: Reported on 06/23/2023) 06/23/2023: As needed   lisinopril (ZESTRIL) 10 MG tablet Take 1 tablet by mouth once daily (Patient not taking: Reported on 06/23/2023)    sodium chloride (OCEAN) 0.65 % SOLN nasal spray Place 1 spray into both nostrils daily as needed for congestion. (Patient not taking: Reported on 06/23/2023) 06/23/2023: As needed   No facility-administered encounter medications on file as of 06/23/2023.   Allergies  Allergen Reactions   Farxiga [Dapagliflozin] Other (See Comments)    Mycotic infections   Hydrocodone Anxiety    ROS: no known fevers (just hot flashes), no chills. No flank pain. No n/v. No constipation. Some loose stools if she eats out. No vaginal discharge   PHYSICAL EXAM:  BP 128/68   Pulse 88   Temp 98 F (36.7 C) (Tympanic)   Ht 5\' 1"  (1.549 m)   Wt 164 lb (74.4 kg)   BMI 30.99 kg/m   Well-appearing female  in no distress HEENT: conjunctiva and sclera are clear, EOMI Neck: no lymphadenopathy Heart: regular rate and rhythm Lungs: clear bilaterally Back: no CVA tenderness Abdomen: mildly tender in suprapubic area, nontender elsewhere, no mass Extremities: no edema Psych: normal mood, affect, hygiene and grooming Neuro: alert and oriented, cranial nerves grossly intact. Normal gait   Urine dip: SG 1.010, 1+ leuks, otherwise negative    ASSESSMENT/PLAN:  Acute cystitis without hematuria - will treat presumptively for UTI while awaiting culture. To contact us if symptoms persist or worsen - Plan: Urine Culture, nitrofurantoin, macrocrystal-monohydrate, (MACROBID) 100 MG  capsule  Urinary frequency - Plan: POCT Urinalysis DIP (Proadvantage Device)

## 2023-06-29 LAB — URINE CULTURE

## 2023-07-22 ENCOUNTER — Encounter: Payer: Self-pay | Admitting: Nurse Practitioner

## 2023-08-01 ENCOUNTER — Other Ambulatory Visit (HOSPITAL_BASED_OUTPATIENT_CLINIC_OR_DEPARTMENT_OTHER): Payer: Self-pay | Admitting: Nurse Practitioner

## 2023-08-01 DIAGNOSIS — I10 Essential (primary) hypertension: Secondary | ICD-10-CM

## 2023-08-01 DIAGNOSIS — E66811 Obesity, class 1: Secondary | ICD-10-CM

## 2023-08-01 DIAGNOSIS — E118 Type 2 diabetes mellitus with unspecified complications: Secondary | ICD-10-CM

## 2023-08-01 DIAGNOSIS — E782 Mixed hyperlipidemia: Secondary | ICD-10-CM

## 2023-08-12 ENCOUNTER — Other Ambulatory Visit (HOSPITAL_BASED_OUTPATIENT_CLINIC_OR_DEPARTMENT_OTHER): Payer: Self-pay | Admitting: Nurse Practitioner

## 2023-08-12 DIAGNOSIS — T7840XA Allergy, unspecified, initial encounter: Secondary | ICD-10-CM

## 2023-08-13 ENCOUNTER — Other Ambulatory Visit: Payer: Self-pay | Admitting: Nurse Practitioner

## 2023-08-13 DIAGNOSIS — R7989 Other specified abnormal findings of blood chemistry: Secondary | ICD-10-CM

## 2023-08-13 DIAGNOSIS — I10 Essential (primary) hypertension: Secondary | ICD-10-CM

## 2023-08-13 DIAGNOSIS — E66811 Obesity, class 1: Secondary | ICD-10-CM

## 2023-08-13 DIAGNOSIS — E782 Mixed hyperlipidemia: Secondary | ICD-10-CM

## 2023-08-13 DIAGNOSIS — E118 Type 2 diabetes mellitus with unspecified complications: Secondary | ICD-10-CM

## 2023-09-15 ENCOUNTER — Encounter: Payer: Self-pay | Admitting: Nurse Practitioner

## 2023-10-14 ENCOUNTER — Other Ambulatory Visit: Payer: Self-pay | Admitting: Nurse Practitioner

## 2023-10-14 DIAGNOSIS — E66811 Obesity, class 1: Secondary | ICD-10-CM

## 2023-10-14 DIAGNOSIS — R7989 Other specified abnormal findings of blood chemistry: Secondary | ICD-10-CM

## 2023-10-14 DIAGNOSIS — I10 Essential (primary) hypertension: Secondary | ICD-10-CM

## 2023-10-14 DIAGNOSIS — E118 Type 2 diabetes mellitus with unspecified complications: Secondary | ICD-10-CM

## 2023-10-14 DIAGNOSIS — E782 Mixed hyperlipidemia: Secondary | ICD-10-CM

## 2023-10-26 ENCOUNTER — Encounter: Payer: Self-pay | Admitting: Nurse Practitioner

## 2023-10-26 ENCOUNTER — Ambulatory Visit (INDEPENDENT_AMBULATORY_CARE_PROVIDER_SITE_OTHER): Payer: Medicare Other | Admitting: Nurse Practitioner

## 2023-10-26 ENCOUNTER — Other Ambulatory Visit: Payer: Self-pay

## 2023-10-26 VITALS — BP 126/84 | HR 58 | Wt 166.0 lb

## 2023-10-26 DIAGNOSIS — E66811 Obesity, class 1: Secondary | ICD-10-CM | POA: Diagnosis not present

## 2023-10-26 DIAGNOSIS — E785 Hyperlipidemia, unspecified: Secondary | ICD-10-CM

## 2023-10-26 DIAGNOSIS — M778 Other enthesopathies, not elsewhere classified: Secondary | ICD-10-CM | POA: Insufficient documentation

## 2023-10-26 DIAGNOSIS — D691 Qualitative platelet defects: Secondary | ICD-10-CM | POA: Diagnosis not present

## 2023-10-26 DIAGNOSIS — E875 Hyperkalemia: Secondary | ICD-10-CM

## 2023-10-26 DIAGNOSIS — R7989 Other specified abnormal findings of blood chemistry: Secondary | ICD-10-CM

## 2023-10-26 DIAGNOSIS — E1169 Type 2 diabetes mellitus with other specified complication: Secondary | ICD-10-CM | POA: Diagnosis not present

## 2023-10-26 DIAGNOSIS — E118 Type 2 diabetes mellitus with unspecified complications: Secondary | ICD-10-CM

## 2023-10-26 DIAGNOSIS — E1159 Type 2 diabetes mellitus with other circulatory complications: Secondary | ICD-10-CM

## 2023-10-26 DIAGNOSIS — E538 Deficiency of other specified B group vitamins: Secondary | ICD-10-CM | POA: Insufficient documentation

## 2023-10-26 DIAGNOSIS — E559 Vitamin D deficiency, unspecified: Secondary | ICD-10-CM | POA: Insufficient documentation

## 2023-10-26 DIAGNOSIS — J069 Acute upper respiratory infection, unspecified: Secondary | ICD-10-CM

## 2023-10-26 DIAGNOSIS — E162 Hypoglycemia, unspecified: Secondary | ICD-10-CM

## 2023-10-26 DIAGNOSIS — K219 Gastro-esophageal reflux disease without esophagitis: Secondary | ICD-10-CM

## 2023-10-26 DIAGNOSIS — I152 Hypertension secondary to endocrine disorders: Secondary | ICD-10-CM

## 2023-10-26 DIAGNOSIS — T7840XA Allergy, unspecified, initial encounter: Secondary | ICD-10-CM

## 2023-10-26 LAB — LIPID PANEL

## 2023-10-26 MED ORDER — VITAMIN B-12 1000 MCG PO TABS: 1000.0000 ug | ORAL_TABLET | Freq: Every day | ORAL | 3 refills | Status: AC

## 2023-10-26 MED ORDER — AZITHROMYCIN 250 MG PO TABS: ORAL_TABLET | ORAL | 0 refills | Status: AC

## 2023-10-26 MED ORDER — LISINOPRIL 10 MG PO TABS: 10.0000 mg | ORAL_TABLET | Freq: Every day | ORAL | 3 refills | Status: AC

## 2023-10-26 MED ORDER — OZEMPIC (2 MG/DOSE) 8 MG/3ML ~~LOC~~ SOPN: 2.0000 mg | PEN_INJECTOR | SUBCUTANEOUS | 3 refills | Status: AC

## 2023-10-26 MED ORDER — OMEPRAZOLE 40 MG PO CPDR: 40.0000 mg | DELAYED_RELEASE_CAPSULE | Freq: Every day | ORAL | 3 refills | Status: AC

## 2023-10-26 MED ORDER — SIMVASTATIN 10 MG PO TABS
10.0000 mg | ORAL_TABLET | Freq: Every day | ORAL | 3 refills | Status: AC
Start: 2023-10-26 — End: ?

## 2023-10-26 MED ORDER — FREESTYLE LIBRE 3 PLUS SENSOR MISC: 5 refills | Status: DC

## 2023-10-26 NOTE — Assessment & Plan Note (Signed)
 Blood pressure readings range from 116/78 mmHg to 136/71 mmHg. Current treatment with lisinopril 10 mg maintains blood pressure within acceptable limits. Emphasis on avoiding hypotension due to occasional low readings. - Continue lisinopril 10 mg daily - Monitor blood pressure regularly

## 2023-10-26 NOTE — Assessment & Plan Note (Signed)
 Blood glucose levels range from 130 to 160 mg/dL in the morning, with occasional readings of 130 mg/dL or lower. Lifestyle factors, including diet, may contribute to variability. Current treatment includes Ozempic 1mg  and metformin once a day. Plan to increase Ozempic to improve glycemic control and discontinue metformin to reduce medication burdon. - Increase Ozempic to 2 mg dose after current supply is used - Discontinue metformin after current supply is finished - Check hemoglobin A1c level - Send Freestyle [place] prescription to pharmacy and verify insurance requirements

## 2023-10-26 NOTE — Assessment & Plan Note (Signed)
 BMI 31.37 today. Discussion of diet and exercise changes that may help with improved weight control. Now that the weather is warming up, she feels this may be easier. We will plan to increase her Ozempic to 2mg  for improved blood sugar control and hopefully this will also help curb her appetite and improve her weight and lipids.

## 2023-10-26 NOTE — Assessment & Plan Note (Signed)
 Repeat labs today. No alarm symptoms present.

## 2023-10-26 NOTE — Patient Instructions (Signed)
 VISIT SUMMARY:  During your visit, we discussed your persistent cold symptoms, diabetes management, blood pressure, vitamin B12 levels, and elbow pain. We have made some adjustments to your medications and provided recommendations to help manage your symptoms and improve your overall health.  YOUR PLAN:  -CHRONIC SINUSITIS WITH ALLERGIC RHINITIS: Chronic sinusitis with allergic rhinitis means you have long-term inflammation of your sinuses along with allergy symptoms. We have prescribed azithromycin for 5 days to help clear the infection. If your symptoms persist after finishing the antibiotics, we may need to change your allergy medication.  -TYPE 2 DIABETES MELLITUS: Type 2 diabetes is a condition where your body does not use insulin properly, leading to high blood sugar levels. We will increase your Ozempic dose to 2 mg after you finish your current supply and discontinue metformin. We will also check your hemoglobin A1c level and send a prescription for Freestyle Libre to the pharmacy.  -HYPERTENSION: Hypertension, or high blood pressure, is when the force of your blood against your artery walls is too high. Your current treatment with lisinopril 10 mg is keeping your blood pressure within acceptable limits. Please continue taking lisinopril daily and monitor your blood pressure regularly.  -VITAMIN B12 DEFICIENCY: Vitamin B12 deficiency means you have low levels of vitamin B12, which is important for nerve function and blood cell production. We will check your B12 level and prescribe an oral B12 supplement, as you find the injections challenging. We will also verify insurance coverage for the oral form.  -LATERAL EPICONDYLITIS (TENNIS ELBOW): Lateral epicondylitis, or tennis elbow, is a condition that causes pain around the outside of your elbow. We recommend using an elbow strap for support and taking over-the-counter NSAIDs to help with pain and inflammation.  INSTRUCTIONS:  Please follow up  with the following instructions: 1. Take azithromycin as prescribed for 5 days. 2. Increase your Ozempic dose to 2 mg after your current supply is used and discontinue metformin. 3. Monitor your blood pressure regularly and continue taking lisinopril 10 mg daily. 4. Check your hemoglobin A1c level and use the [facility] as prescribed. 5. Check your B12 level and start the oral B12 supplement once prescribed. 6. Use an elbow strap for support and take over-the-counter NSAIDs for elbow pain and inflammation.

## 2023-10-26 NOTE — Assessment & Plan Note (Signed)
 Previous low B12 levels treated with B12 injections. She reports improvement in symptoms such as leg itching. Considering transition to oral B12 supplementation due to difficulty with injections and exploring insurance coverage for oral form. - Check B12 level - Prescribe oral B12 supplement and verify insurance coverage

## 2023-10-26 NOTE — Assessment & Plan Note (Signed)
 Symptoms of exacerbation of allergies including sneezing, clogged ears, and sinus pressure likely contributing to the increased symptoms of the prolonged cold she has been experiencing. Will treat the URI as bacterial given that it has been present so long. She will continue with allegra for now, but can consider changing to xyzal or a different medication if the symptoms do not improve.

## 2023-10-26 NOTE — Assessment & Plan Note (Signed)
 Repeat labs:

## 2023-10-26 NOTE — Assessment & Plan Note (Signed)
Repeat labs today for monitoring. Continue to work on diet and weight management

## 2023-10-26 NOTE — Assessment & Plan Note (Signed)
Controlled with daily PPI. No changes in therapy.

## 2023-10-26 NOTE — Assessment & Plan Note (Signed)
 Intermittent episodes of low blood sugar. Recommend monitoring with CGM for better control and awareness. Will resend the order. Samples provided today

## 2023-10-26 NOTE — Assessment & Plan Note (Signed)
 Pain and weakness in the right elbow, especially when lifting objects. No numbness or tingling, suggesting tendonitis rather than arthritis. Recommended use of an elbow strap for support and over-the-counter NSAIDs for pain and inflammation. - Recommend use of an elbow strap for support - Consider over-the-counter NSAIDs for pain and inflammation

## 2023-10-26 NOTE — Assessment & Plan Note (Signed)
Repeat labs today. Chronic finding with no concerns at this time.

## 2023-10-26 NOTE — Assessment & Plan Note (Signed)
Chronic. Lipids stable. No alarm symptoms present at this time. LDL Goal < 70. Controlled with  simvastatin .  Plan: Continue current lipid-lowering therapy. labs are reviewed, up to date and normal Recommend Treatment of diabetes  and Treatment of hypertension  Diet and exercise recommendations provided.  Follow-up in 6months or sooner based on lab findings as appropriate.

## 2023-10-26 NOTE — Progress Notes (Signed)
 Jeanne Clamp, DNP, AGNP-c [facility]  [address] [place], [place] [address] [phone]  ESTABLISHED PATIENT- Chronic Health and/or Follow-Up Visit  Blood pressure 126/84, pulse (!) 58, weight 166 lb (75.3 kg).    Jeanne Shaw is a 68 y.o. year old female presenting today for evaluation and management of chronic conditions.   Discussed the use of AI scribe software for clinical note transcription with the patient, who gave verbal consent to proceed.  History of Present Illness   Jeanne Shaw is a 68 year old female who presents for f/u DM, HTN, HLD and expresses concerns with persistent cold symptoms since November or December.  She has been experiencing persistent cold symptoms since November or December. Initially, she had a severe cough, which has since improved, but she now has plugged ears and an itch in her ear that won't go away. She also reports increased sneezing over the past week. The symptoms never fully resolved, and she describes having postnasal drip for a long time. She mentions that her teeth were hurting for a few days. No severe tenderness in the sinus areas today.  She is currently on allergy medication, specifically Allegra, which she has been taking for over a year. She recalls trying another allergy medication in the past but cannot remember the details.  She is managing her diabetes with Ozempic and has recently reduced her metformin dose to once a day. Her blood sugar levels in the morning range from 130 to 160 mg/dL, with occasional readings of 130 mg/dL or lower. She acknowledges that lifestyle changes, such as diet, may impact her blood sugar levels. She did not notice a change in blood sugars with the reduction in metformin.   She has been monitoring her blood pressure, with readings ranging from 116/78 mmHg to 136/71 mmHg. She is on 10 mg of lisinopril and is concerned about maintaining her blood pressure within  a healthy range. She would like to come off of her blood pressure medications and would like to know if this would be possible based on these readings.   She has been receiving B12 injections due to low levels. She notes some improvement in her symptoms but finds the injections challenging to administer. She is interested in transitioning to oral B12.   She mentions having some pain around her elbow, which she describes as a challenge when lifting objects, but denies any numbness, tingling, or shooting pain.      All ROS negative with exception of what is listed above.   PHYSICAL EXAM Physical Exam Vitals and nursing note reviewed.  Constitutional:      Appearance: Normal appearance.  HENT:     Head: Normocephalic.     Right Ear: A middle ear effusion is present.     Left Ear: A middle ear effusion is present.     Nose: Congestion present.     Mouth/Throat:     Mouth: Mucous membranes are moist.     Pharynx: Oropharynx is clear. Posterior oropharyngeal erythema present.  Eyes:     Conjunctiva/sclera: Conjunctivae normal.     Pupils: Pupils are equal, round, and reactive to light.  Neck:     Vascular: No carotid bruit.  Cardiovascular:     Rate and Rhythm: Normal rate and regular rhythm.     Pulses: Normal pulses.     Heart sounds: Normal heart sounds.  Pulmonary:     Effort: Pulmonary effort is normal.     Breath sounds: Normal breath  sounds. No wheezing or rhonchi.  Abdominal:     General: Bowel sounds are normal. There is no distension.     Palpations: Abdomen is soft.     Tenderness: There is no abdominal tenderness.  Musculoskeletal:        General: Tenderness present.     Cervical back: Normal range of motion. No tenderness.     Right lower leg: No edema.     Left lower leg: No edema.     Comments: Tenderness present along the lateral epicondyle of the right arm with pain and mild inflammation noted along the common extensor tendon.   Lymphadenopathy:     Cervical:  No cervical adenopathy.  Skin:    General: Skin is warm and dry.     Capillary Refill: Capillary refill takes 2 to 3 seconds.  Neurological:     Mental Status: She is alert and oriented to person, place, and time.     Motor: No weakness.     Gait: Gait normal.  Psychiatric:        Mood and Affect: Mood normal.        Behavior: Behavior normal.      PLAN Problem List Items Addressed This Visit     DM (diabetes mellitus), type 2 with complications (HCC) - Primary   Blood glucose levels range from 130 to 160 mg/dL in the morning, with occasional readings of 130 mg/dL or lower. Lifestyle factors, including diet, may contribute to variability. Current treatment includes Ozempic 1mg  and metformin once a day. Plan to increase Ozempic to improve glycemic control and discontinue metformin to reduce medication burdon. - Increase Ozempic to 2 mg dose after current supply is used - Discontinue metformin after current supply is finished - Check hemoglobin A1c level - Send Freestyle Libre prescription to pharmacy and verify insurance requirements      Relevant Medications   Semaglutide, 2 MG/DOSE, (OZEMPIC, 2 MG/DOSE,) 8 MG/3ML SOPN   lisinopril (ZESTRIL) 10 MG tablet   simvastatin (ZOCOR) 10 MG tablet   Other Relevant Orders   CBC with Differential/Platelet   CMP14+EGFR   Hemoglobin A1c   Lipid panel   Hypertension associated with type 2 diabetes mellitus (HCC)   Blood pressure readings range from 116/78 mmHg to 136/71 mmHg. Current treatment with lisinopril 10 mg maintains blood pressure within acceptable limits. Emphasis on avoiding hypotension due to occasional low readings. - Continue lisinopril 10 mg daily - Monitor blood pressure regularly      Relevant Medications   Semaglutide, 2 MG/DOSE, (OZEMPIC, 2 MG/DOSE,) 8 MG/3ML SOPN   lisinopril (ZESTRIL) 10 MG tablet   simvastatin (ZOCOR) 10 MG tablet   Other Relevant Orders   CBC with Differential/Platelet   CMP14+EGFR    Hemoglobin A1c   Lipid panel   Hyperlipidemia associated with type 2 diabetes mellitus (HCC)   Chronic. Lipids stable. No alarm symptoms present at this time. LDL Goal < 70. Controlled with  simvastatin .  Plan: Continue current lipid-lowering therapy. labs are reviewed, up to date and normal Recommend Treatment of diabetes  and Treatment of hypertension  Diet and exercise recommendations provided.  Follow-up in 6months or sooner based on lab findings as appropriate.         Relevant Medications   Semaglutide, 2 MG/DOSE, (OZEMPIC, 2 MG/DOSE,) 8 MG/3ML SOPN   lisinopril (ZESTRIL) 10 MG tablet   simvastatin (ZOCOR) 10 MG tablet   Other Relevant Orders   CBC with Differential/Platelet   CMP14+EGFR   Hemoglobin A1c  Lipid panel   Obesity, Class I, BMI 30.0-34.9 (see actual BMI)   BMI 31.37 today. Discussion of diet and exercise changes that may help with improved weight control. Now that the weather is warming up, she feels this may be easier. We will plan to increase her Ozempic to 2mg  for improved blood sugar control and hopefully this will also help curb her appetite and improve her weight and lipids.       Relevant Medications   lisinopril (ZESTRIL) 10 MG tablet   simvastatin (ZOCOR) 10 MG tablet   Elevated LFTs   Repeat labs today for monitoring. Continue to work on diet and weight management      Relevant Orders   CBC with Differential/Platelet   CMP14+EGFR   Hemoglobin A1c   Lipid panel   Gastroesophageal reflux disease   Controlled with daily PPI. No changes in therapy.       Relevant Medications   omeprazole (PRILOSEC) 40 MG capsule   Allergies   Symptoms of exacerbation of allergies including sneezing, clogged ears, and sinus pressure likely contributing to the increased symptoms of the prolonged cold she has been experiencing. Will treat the URI as bacterial given that it has been present so long. She will continue with allegra for now, but can consider changing  to xyzal or a different medication if the symptoms do not improve.       Abnormal platelets (HCC)   Repeat labs today. Chronic finding with no concerns at this time.       Relevant Orders   CBC with Differential/Platelet   Serum potassium elevated   Repeat labs today. No alarm symptoms present.       Hypoglycemia   Intermittent episodes of low blood sugar. Recommend monitoring with CGM for better control and awareness. Will resend the order. Samples provided today      B12 deficiency   Previous low B12 levels treated with B12 injections. She reports improvement in symptoms such as leg itching. Considering transition to oral B12 supplementation due to difficulty with injections and exploring insurance coverage for oral form. - Check B12 level - Prescribe oral B12 supplement and verify insurance coverage      Relevant Medications   cyanocobalamin (VITAMIN B12) 1000 MCG tablet   Other Relevant Orders   Vitamin B12   Vitamin D deficiency   Repeat labs      Relevant Orders   VITAMIN D 25 Hydroxy (Vit-D Deficiency, Fractures)   Right elbow tendonitis   Pain and weakness in the right elbow, especially when lifting objects. No numbness or tingling, suggesting tendonitis rather than arthritis. Recommended use of an elbow strap for support and over-the-counter NSAIDs for pain and inflammation. - Recommend use of an elbow strap for support - Consider over-the-counter NSAIDs for pain and inflammation      Other Visit Diagnoses       Upper respiratory tract infection, unspecified type       Relevant Medications   azithromycin (ZITHROMAX) 250 MG tablet     Mixed hyperlipidemia       Relevant Medications   lisinopril (ZESTRIL) 10 MG tablet   simvastatin (ZOCOR) 10 MG tablet     Essential hypertension       Relevant Medications   lisinopril (ZESTRIL) 10 MG tablet   simvastatin (ZOCOR) 10 MG tablet       Return in about 6 months (around 04/27/2024) for CPE.  Jeanne Clamp,  DNP, AGNP-c

## 2023-10-27 LAB — CMP14+EGFR
ALT: 34 IU/L — ABNORMAL HIGH (ref 0–32)
AST: 21 IU/L (ref 0–40)
Albumin: 4.3 g/dL (ref 3.9–4.9)
Alkaline Phosphatase: 114 IU/L (ref 44–121)
BUN/Creatinine Ratio: 20 (ref 12–28)
BUN: 12 mg/dL (ref 8–27)
Bilirubin Total: 0.3 mg/dL (ref 0.0–1.2)
CO2: 22 mmol/L (ref 20–29)
Calcium: 10 mg/dL (ref 8.7–10.3)
Chloride: 104 mmol/L (ref 96–106)
Creatinine, Ser: 0.6 mg/dL (ref 0.57–1.00)
Globulin, Total: 2.2 g/dL (ref 1.5–4.5)
Glucose: 134 mg/dL — ABNORMAL HIGH (ref 70–99)
Potassium: 5 mmol/L (ref 3.5–5.2)
Sodium: 142 mmol/L (ref 134–144)
Total Protein: 6.5 g/dL (ref 6.0–8.5)
eGFR: 98 mL/min/{1.73_m2} (ref >59)

## 2023-10-27 LAB — CBC WITH DIFFERENTIAL/PLATELET
Basophils Absolute: 0.1 10*3/uL (ref 0.0–0.2)
Basos: 1 %
EOS (ABSOLUTE): 0.5 10*3/uL — ABNORMAL HIGH (ref 0.0–0.4)
Eos: 6 %
Hematocrit: 42.6 % (ref 34.0–46.6)
Hemoglobin: 14.2 g/dL (ref 11.1–15.9)
Immature Grans (Abs): 0 10*3/uL (ref 0.0–0.1)
Immature Granulocytes: 0 %
Lymphocytes Absolute: 2.3 10*3/uL (ref 0.7–3.1)
Lymphs: 29 %
MCH: 31.1 pg (ref 26.6–33.0)
MCHC: 33.3 g/dL (ref 31.5–35.7)
MCV: 93 fL (ref 79–97)
Monocytes Absolute: 0.3 10*3/uL (ref 0.1–0.9)
Monocytes: 4 %
Neutrophils Absolute: 4.7 10*3/uL (ref 1.4–7.0)
Neutrophils: 60 %
Platelets: 535 10*3/uL — ABNORMAL HIGH (ref 150–450)
RBC: 4.57 x10E6/uL (ref 3.77–5.28)
RDW: 11.9 % (ref 11.7–15.4)
WBC: 8 10*3/uL (ref 3.4–10.8)

## 2023-10-27 LAB — VITAMIN D 25 HYDROXY (VIT D DEFICIENCY, FRACTURES): Vit D, 25-Hydroxy: 35.1 ng/mL (ref 30.0–100.0)

## 2023-10-27 LAB — LIPID PANEL
Cholesterol, Total: 114 mg/dL (ref 100–199)
HDL: 36 mg/dL — ABNORMAL LOW (ref >39)
LDL CALC COMMENT:: 3.2 ratio (ref 0.0–4.4)
LDL Chol Calc (NIH): 57 mg/dL (ref 0–99)
Triglycerides: 113 mg/dL (ref 0–149)
VLDL Cholesterol Cal: 21 mg/dL (ref 5–40)

## 2023-10-27 LAB — HEMOGLOBIN A1C
Est. average glucose Bld gHb Est-mCnc: 163 mg/dL
Hgb A1c MFr Bld: 7.3 % — ABNORMAL HIGH (ref 4.8–5.6)

## 2023-10-27 LAB — VITAMIN B12: Vitamin B-12: 768 pg/mL (ref 232–1245)

## 2023-10-28 ENCOUNTER — Telehealth: Payer: Self-pay

## 2023-10-28 NOTE — Progress Notes (Unsigned)
   10/28/2023  Patient ID: Jeanne Shaw, female   DOB: Nov 25, 1955, 68 y.o.   MRN: [id number]  Submitted application for Ozempic 2mg  PAP through [employer]. Pending company decision.  Sherrill Raring, PharmD Clinical Pharmacist [phone]

## 2023-11-02 ENCOUNTER — Encounter: Payer: Self-pay | Admitting: Nurse Practitioner

## 2023-11-10 ENCOUNTER — Encounter: Payer: Self-pay | Admitting: Nurse Practitioner

## 2023-11-13 ENCOUNTER — Other Ambulatory Visit: Payer: Self-pay | Admitting: Nurse Practitioner

## 2023-11-17 ENCOUNTER — Telehealth: Payer: Self-pay | Admitting: Nurse Practitioner

## 2023-11-17 NOTE — Telephone Encounter (Signed)
 PAP OZEMPIC received, pt informed

## 2023-11-24 LAB — HM DIABETES EYE EXAM

## 2023-11-25 ENCOUNTER — Encounter: Payer: Self-pay | Admitting: Nurse Practitioner

## 2023-11-25 DIAGNOSIS — R002 Palpitations: Secondary | ICD-10-CM

## 2023-11-25 DIAGNOSIS — I499 Cardiac arrhythmia, unspecified: Secondary | ICD-10-CM

## 2023-12-02 ENCOUNTER — Other Ambulatory Visit: Payer: Self-pay | Admitting: Nurse Practitioner

## 2023-12-03 ENCOUNTER — Ambulatory Visit: Attending: Nurse Practitioner

## 2023-12-03 ENCOUNTER — Encounter: Payer: Self-pay | Admitting: Nurse Practitioner

## 2023-12-03 ENCOUNTER — Ambulatory Visit

## 2023-12-03 DIAGNOSIS — R002 Palpitations: Secondary | ICD-10-CM

## 2023-12-03 DIAGNOSIS — I499 Cardiac arrhythmia, unspecified: Secondary | ICD-10-CM

## 2023-12-03 NOTE — Progress Notes (Unsigned)
 EP to read.

## 2023-12-09 ENCOUNTER — Encounter: Payer: Self-pay | Admitting: Nurse Practitioner

## 2023-12-10 ENCOUNTER — Encounter: Payer: Self-pay | Admitting: Nurse Practitioner

## 2023-12-24 ENCOUNTER — Ambulatory Visit (INDEPENDENT_AMBULATORY_CARE_PROVIDER_SITE_OTHER): Admitting: Nurse Practitioner

## 2023-12-24 ENCOUNTER — Encounter: Payer: Self-pay | Admitting: Nurse Practitioner

## 2023-12-24 VITALS — BP 124/80 | HR 85 | Wt 164.6 lb

## 2023-12-24 DIAGNOSIS — M5135 Other intervertebral disc degeneration, thoracolumbar region: Secondary | ICD-10-CM | POA: Insufficient documentation

## 2023-12-24 DIAGNOSIS — F5101 Primary insomnia: Secondary | ICD-10-CM | POA: Diagnosis not present

## 2023-12-24 DIAGNOSIS — I152 Hypertension secondary to endocrine disorders: Secondary | ICD-10-CM

## 2023-12-24 DIAGNOSIS — M5030 Other cervical disc degeneration, unspecified cervical region: Secondary | ICD-10-CM | POA: Diagnosis not present

## 2023-12-24 DIAGNOSIS — I471 Supraventricular tachycardia, unspecified: Secondary | ICD-10-CM | POA: Diagnosis not present

## 2023-12-24 DIAGNOSIS — D691 Qualitative platelet defects: Secondary | ICD-10-CM

## 2023-12-24 DIAGNOSIS — E1159 Type 2 diabetes mellitus with other circulatory complications: Secondary | ICD-10-CM

## 2023-12-24 MED ORDER — TRAZODONE HCL 50 MG PO TABS: 25.0000 mg | ORAL_TABLET | Freq: Every evening | ORAL | 3 refills | Status: DC | PRN

## 2023-12-24 NOTE — Assessment & Plan Note (Signed)
 Reports difficulty sleeping, with insomnia increasing in frequency. Tried Tylenol PM and melatonin without success. Discussed the use of trazodone, an antidepressant used off-label for sleep, due to its sedative properties. Reassured about the safety of trazodone, especially at low doses used for sleep. - Prescribe trazodone as needed for sleep.

## 2023-12-24 NOTE — Assessment & Plan Note (Signed)
 Experiences intermittent back pain and paresthesias, likely due to degenerative disc disease. Imaging from 2022 shows reduced disc space, suggesting nerve impingement. Symptoms include tingling and stabbing pains in the extremities, consistent with nerve irritation. Suspects muscular involvement due to the intermittent nature of the symptoms and suggests that muscle tension may be contributing to nerve irritation. - Provide exercises and stretches to alleviate symptoms. - Consider referral to physical therapy if symptoms persist. - Recommend NSAIDs like ibuprofen for inflammation and pain management. - Consider muscle relaxers for severe episodes of pain.

## 2023-12-24 NOTE — Assessment & Plan Note (Signed)
 Chronic with no concerns. Recent labs stable.

## 2023-12-24 NOTE — Assessment & Plan Note (Signed)
 Four episodes of SVT were recorded, with the longest lasting 2 minutes and 38 seconds and a maximum heart rate of 164 bpm. Episodes occurred at rest without exertion. No ventricular fibrillation, AV block, or atrial fibrillation was present. The primary concern is the potential for SVT to cause significant symptoms or complications if it becomes more frequent or prolonged. She has not experienced significant symptoms such as chest pain or dizziness during these episodes. Discussed the possibility of using a beta blocker or calcium channel blocker to manage the SVT but decided to wait for cardiology input before starting medication. Advised to perform vagal maneuvers if symptoms occur and to monitor for any increase in frequency or severity of episodes. She is aware to go to the emergency room for sustained symptoms or anything she feels is concerning.  - Refer to cardiology for further evaluation and management of SVT. - Instruct on performing vagal maneuvers if SVT symptoms occur. - Monitor for increased frequency or severity of SVT episodes and report to the clinic.

## 2023-12-24 NOTE — Assessment & Plan Note (Signed)
 Well controlled. May consider adding CCB to regimen for SVT if cardiology feels this is appropriate. Will monitor.

## 2023-12-24 NOTE — Patient Instructions (Addendum)
 I have sent in the referral for cardiology for the SVT that showed on the zio patch. They will call you to schedule an appointment.   Let me know if sleeping well does not help with the back and neck symptoms. If it doesn't help we will send a referral to physical therapy.   Slowly restart the vitamin D  and let me know if this causes any changes.   I sent in the trazodone for you to take as needed for sleep. Take this about 30 minutes before bed time. Start with the lowest dose and increase if needed.   Work to add fiber to [employer] (fibercon, benefiber, metamucil, etc) to help with the constipation.

## 2023-12-24 NOTE — Progress Notes (Signed)
 [name], DNP, AGNP-c [facility]  [address] [place], [place] [address] [phone]  ESTABLISHED PATIENT- Chronic Health and/or Follow-Up Visit  Blood pressure 124/80, pulse 85, weight 164 lb 9.6 oz (74.7 kg), SpO2 96%.    Jeanne Shaw is a 68 y.o. year old female presenting today for evaluation and management of chronic conditions.   History of Present Illness Jeanne Shaw is a 68 year old female here to follow-up on recent Zio patch readings for palpitations.    The zio readout showed four episodes of short runs SVT that were not detected by her during monitoring. The longest lasting two minutes and thirty-eight seconds. During the episodes, her heart rate reached a maximum of 164 beats per minute, with an average of 134 beats per minute. No shortness of breath, but she notes a fluttering pressure sensation even while lying in bed. The episodes are not associated with exertion. She did appear to note some episodes of bigeminy that showed on the reading. She tells me that the palpitations have actually improved and are not occurring daily. Over the past 48 hours, she has not experienced significant palpitations.   She has been taking vitamin D  supplements, which she stopped temporarily to see if this had any effect on her symptoms. She also reduced her intake of chamomile and green tea. She typically consumes decaffeinated beverages.  She experiences paresthesia's described as a tingling sensation starting from her back down to her ankles and arms, sometimes accompanied by back pain. The symptoms are exacerbated by cold environments and physical activity, such as riding in a van on a long bumpy ride. The tingling is sometimes accompanied by stabbing pains in her feet and toes, lasting five to ten minutes.  She reports a pattern of insomnia, with difficulty sleeping through the night, often getting her best sleep between 4 and 7 AM. She  has tried Tylenol PM and melatonin without success. She also experiences occasional severe headaches, which she attributes to her back issues.  She has a history of degenerative disc disease, with CT imaging from 2022 showing reduced disc space in her spine. She has been performing exercises from previous physical therapy sessions to manage her symptoms, which have provided some relief.  All ROS negative with exception of what is listed above.   PHYSICAL EXAM Physical Exam Vitals and nursing note reviewed.  Constitutional:      Appearance: Normal appearance.  HENT:     Head: Normocephalic.  Eyes:     Pupils: Pupils are equal, round, and reactive to light.  Neck:     Vascular: No carotid bruit.  Cardiovascular:     Rate and Rhythm: Normal rate and regular rhythm.     Pulses: Normal pulses.     Heart sounds: Normal heart sounds.  Pulmonary:     Effort: Pulmonary effort is normal.     Breath sounds: Normal breath sounds.  Abdominal:     General: Bowel sounds are normal.     Palpations: Abdomen is soft.  Musculoskeletal:        General: No deformity. Normal range of motion.     Cervical back: Normal range of motion. No tenderness.     Right lower leg: No edema.     Left lower leg: No edema.  Skin:    General: Skin is warm.     Capillary Refill: Capillary refill takes less than 2 seconds.  Neurological:     General: No focal deficit present.  Mental Status: She is alert and oriented to person, place, and time.  Psychiatric:        Mood and Affect: Mood normal.      PLAN Problem List Items Addressed This Visit     Hypertension associated with type 2 diabetes mellitus (HCC)   Well controlled. May consider adding CCB to regimen for SVT if cardiology feels this is appropriate. Will monitor.       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 Recent labs stable.      SVT (supraventricular tachycardia) (HCC) - Primary   Four episodes of SVT were recorded, with  the longest lasting 2 minutes and 38 seconds and a maximum heart rate of 164 bpm. Episodes occurred at rest without exertion. No ventricular fibrillation, AV block, or atrial fibrillation was present. The primary concern is the potential for SVT to cause significant symptoms or complications if it becomes more frequent or prolonged. She has not experienced significant symptoms such as chest pain or dizziness during these episodes. Discussed the possibility of using a beta blocker or calcium channel blocker to manage the SVT but decided to wait for cardiology input before starting medication. Advised to perform vagal maneuvers if symptoms occur and to monitor for any increase in frequency or severity of episodes. She is aware to go to the emergency room for sustained symptoms or anything she feels is concerning.  - Refer to cardiology for further evaluation and management of SVT. - Instruct on performing vagal maneuvers if SVT symptoms occur. - Monitor for increased frequency or severity of SVT episodes and report to the clinic.       Relevant Orders   Ambulatory referral to Cardiology   Primary insomnia   Reports difficulty sleeping, with insomnia increasing in frequency. Tried Tylenol PM and melatonin without success. Discussed the use of trazodone, an antidepressant used off-label for sleep, due to its sedative properties. Reassured about the safety of trazodone, especially at low doses used for sleep. - Prescribe trazodone as needed for sleep.      Relevant Medications   traZODone (DESYREL) 50 MG tablet   DDD (degenerative disc disease), cervical   Experiences intermittent back pain and paresthesias, likely due to degenerative disc disease. Imaging from 2022 shows reduced disc space, suggesting nerve impingement. Symptoms include tingling and stabbing pains in the extremities, consistent with nerve irritation. Suspects muscular involvement due to the intermittent nature of the symptoms and  suggests that muscle tension may be contributing to nerve irritation. - Provide exercises and stretches to alleviate symptoms. - Consider referral to physical therapy if symptoms persist. - Recommend NSAIDs like ibuprofen for inflammation and pain management. - Consider muscle relaxers for severe episodes of pain.      DDD (degenerative disc disease), thoracolumbar   Experiences intermittent back pain and paresthesias, likely due to degenerative disc disease. Imaging from 2022 shows reduced disc space, suggesting nerve impingement. Symptoms include tingling and stabbing pains in the extremities, consistent with nerve irritation. Suspects muscular involvement due to the intermittent nature of the symptoms and suggests that muscle tension may be contributing to nerve irritation. - Provide exercises and stretches to alleviate symptoms. - Consider referral to physical therapy if symptoms persist. - Recommend NSAIDs like ibuprofen for inflammation and pain management. - Consider muscle relaxers for severe episodes of pain.       Return if symptoms worsen or fail to improve.  SaraBeth Kischa Altice, DNP, AGNP-c Time: 73 minutes, >50% spent counseling, care coordination, chart review,  and documentation.

## 2023-12-27 DIAGNOSIS — R002 Palpitations: Secondary | ICD-10-CM

## 2023-12-27 DIAGNOSIS — I499 Cardiac arrhythmia, unspecified: Secondary | ICD-10-CM

## 2023-12-28 ENCOUNTER — Ambulatory Visit: Payer: Self-pay | Admitting: Nurse Practitioner

## 2024-01-03 ENCOUNTER — Encounter (INDEPENDENT_AMBULATORY_CARE_PROVIDER_SITE_OTHER): Payer: Self-pay | Admitting: Ophthalmology

## 2024-01-03 ENCOUNTER — Encounter: Payer: Self-pay | Admitting: Nurse Practitioner

## 2024-01-06 NOTE — Progress Notes (Signed)
 [facility] - Clinic Note  01/17/2024     CHIEF COMPLAINT Patient presents for Retina Follow Up  HISTORY OF PRESENT ILLNESS: Jeanne Shaw is a 68 y.o. female who presents to the clinic today for:   HPI     Retina Follow Up   Patient presents with  Dry AMD.  In right eye.  Severity is moderate.  Duration of 2 weeks.  Since onset it is gradually worsening.  I, the attending physician,  performed the HPI with the patient and updated documentation appropriately.        Comments   Patient states vision worse OD. Sudden onset. Patient woke up a couple of weeks ago and vision was blurry OD. Denies any vision changes OS. Patient BS is 144 this am. Last A1c was over 8.      Last edited by Ronelle Coffee, MD on 01/17/2024  8:21 AM.     Pt feels like her right eye vision is worse   Referring physician: Annella Kief, NP [address] [place],  [place] [address]  HISTORICAL INFORMATION:   Selected notes from the MEDICAL RECORD NUMBER Referred by M. Dixon, PA-C for DM exam Ocular Hx-  PMH- DM (A1C 7.4 on 04.01.19), HTN, asthma,     CURRENT MEDICATIONS: Current Outpatient Medications (Ophthalmic Drugs)  Medication Sig   Polyethyl Glycol-Propyl Glycol (SYSTANE) 0.4-0.3 % SOLN Apply 1 drop to eye daily.   No current facility-administered medications for this visit. (Ophthalmic Drugs)   Current Outpatient Medications (Other)  Medication Sig   Ascorbic Acid (VITAMIN C) 1000 MG tablet Take 1,000 mg by mouth daily.   Cholecalciferol (VITAMIN D ) 125 MCG (5000 UT) CAPS Take 5,000 Units by mouth daily.   Continuous Glucose Sensor (FREESTYLE LIBRE 3 PLUS SENSOR) MISC Change sensor every 15 days.   cyanocobalamin  (VITAMIN B12) 1000 MCG tablet Take 1 tablet (1,000 mcg total) by mouth daily.   estradiol  (ESTRACE ) 0.1 MG/GM vaginal cream Place 0.5g nightly for two weeks then twice a week after   fexofenadine  (ALLEGRA ) 180 MG tablet TAKE 1 TABLET BY  MOUTH ONCE DAILY FOR ALLERGIES   Flaxseed, Linseed, (FLAX SEEDS PO) Take 1 tablet by mouth daily.   lisinopril  (ZESTRIL ) 10 MG tablet Take 1 tablet (10 mg total) by mouth daily.   Multiple Vitamins-Minerals (ICAPS AREDS 2 PO) Take by mouth.   omeprazole  (PRILOSEC) 40 MG capsule Take 1 capsule (40 mg total) by mouth daily.   Red Yeast Rice Extract (RED YEAST RICE PO) Take 1 tablet by mouth daily.    Semaglutide , 2 MG/DOSE, (OZEMPIC , 2 MG/DOSE,) 8 MG/3ML SOPN Inject 2 mg into the skin once a week.   simvastatin  (ZOCOR ) 10 MG tablet Take 1 tablet (10 mg total) by mouth at bedtime.   SYRINGE-NEEDLE, DISP, 3 ML (LUER LOCK SAFETY SYRINGES) 25G X 5/8 3 ML MISC 1 Needle by Does not apply route every 30 (thirty) days.   traZODone  (DESYREL ) 50 MG tablet Take 0.5-1 tablets (25-50 mg total) by mouth at bedtime as needed for sleep.   Zinc 50 MG CAPS    aspirin EC 81 MG tablet Take 81 mg by mouth daily.   No current facility-administered medications for this visit. (Other)   REVIEW OF SYSTEMS: ROS   Positive for: Gastrointestinal, Skin, Endocrine, Eyes, Respiratory Negative for: Constitutional, Neurological, Genitourinary, Musculoskeletal, HENT, Cardiovascular, Psychiatric, Allergic/Imm, Heme/Lymph Last edited by Diona Franklin D, COT on 01/17/2024  7:38 AM.       ALLERGIES  Allergies  Allergen Reactions   Farxiga  [Dapagliflozin ] Other (See Comments)    Mycotic infections   Hydrocodone Anxiety   PAST MEDICAL HISTORY Past Medical History:  Diagnosis Date   Acute swimmer's ear of right side 11/24/2021   Allergy    Anxiety    Asthma    Cataract    OU   Cervicogenic headache 04/08/2022   Diabetes mellitus without complication (HCC)    Encounter for medical examination to establish care 04/25/2021   GERD (gastroesophageal reflux disease)    Hypertension    Leg pain, posterior, left 04/25/2021   Macular degeneration    Dry OU   Neck and shoulder pain 04/08/2022   Numbness and tingling of  foot 04/26/2023   Osteopenia    Pruritus 11/18/2022   Pulsatile tinnitus of left ear 11/13/2022   Rosacea    Subacute vaginitis 04/25/2021   Wheezing 08/25/2022   Past Surgical History:  Procedure Laterality Date   BIOPSY  03/26/2020   Procedure: BIOPSY;  Surgeon: Urban Garden, MD;  [facility]: [facility];  Service: Gastroenterology;;   COLONOSCOPY WITH PROPOFOL  N/A 03/26/2020   Procedure: COLONOSCOPY WITH PROPOFOL ;  Surgeon: Urban Garden, MD;  Location: [facility];  Service: Gastroenterology;  Laterality: N/A;  815   CYST EXCISION     off vocal cord    POLYPECTOMY  03/26/2020   Procedure: POLYPECTOMY;  Surgeon: Umberto Ganong, Bearl Limes, MD;  Location: [facility];  Service: Gastroenterology;;    FAMILY HISTORY Family History  Problem Relation Age of Onset   COPD Mother    Alcohol abuse Father    Diabetes Father    Heart disease Father    Hyperlipidemia Father    Hypertension Father    Cancer Sister    Diabetes Sister    Hyperlipidemia Sister    Hypertension Sister    Breast cancer Sister 74   Hypertension Brother    Alcohol abuse Maternal Aunt    Alcohol abuse Maternal Uncle    Hypertension Maternal Uncle    Alcohol abuse Paternal Aunt    Cancer Paternal Aunt    Alcohol abuse Paternal Uncle    Cancer Paternal Uncle    Diabetes Maternal Grandmother    Stroke Maternal Grandmother    Heart disease Maternal Grandfather    SOCIAL HISTORY Social History   Tobacco Use   Smoking status: Former    Current packs/day: 0.00    Average packs/day: 0.3 packs/day for 30.0 years (7.5 ttl pk-yrs)    Types: Cigarettes    Start date: 08/02/1985    Quit date: 08/03/2015    Years since quitting: 8.4   Smokeless tobacco: Former  Building services engineer status: Never Used  Substance Use Topics   Alcohol use: Yes    Alcohol/week: 12.0 standard drinks of alcohol    Types: 12 Glasses of wine per week    Comment: beer occassionally   Drug use: No        OPHTHALMIC EXAM:  Base Eye Exam     Visual Acuity (Snellen - Linear)       Right Left   Dist [place] 20/20 -1 20/25   Dist ph [place]  20/20 -2         Tonometry (Tonopen, 7:48 AM)       Right Left   Pressure 14 15         Pupils       Dark Light Shape React APD   Right 3 2  Round Brisk None   Left 3 2 Round Brisk None         Visual Fields (Counting fingers)       Left Right    Full Full         Extraocular Movement       Right Left    Full, Ortho Full, Ortho         Neuro/Psych     Oriented x3: Yes   Mood/Affect: Normal         Dilation     Both eyes: 1.0% Mydriacyl, 2.5% Phenylephrine @ 7:48 AM           Slit Lamp and Fundus Exam     Slit Lamp Exam       Right Left   Lids/Lashes Dermatochalasis - upper lid Dermatochalasis - upper lid   Conjunctiva/Sclera nasal and temporal pinguecula nasal and temporal pinguecula   Cornea well healed cataract wound, tear film debris, trace PEE well healed cataract wound, mild tear film debris, trace PEE   Anterior Chamber Deep and quiet Deep and quiet   Iris Round and dilated, No NVI Round and dilated, No NVI   Lens Toric PC IOL in good position with marks at 0400 and 1000, trace posterior capsular opacification PC IOL in good position, trace posterior capsular opacification   Anterior Vitreous Mild syneresis Mild syneresis         Fundus Exam       Right Left   Disc Pink and Sharp Pink and Sharp, Compact   C/D Ratio 0.3 0.2   Macula flat, blunted foveal reflex, mild RPE mottling and clumping, focal druse/pigment clump superonasal to fovea, No heme or edema Flat, Blunted foveal reflex, drusen, mild RPE mottling, clumping and early atrophy, +PEDs, No heme or edema   Vessels attenuated, Tortuous, mild copper wiring attenuated, mild tortuosity   Periphery Attached, focal peripheral drusen nasally, reticular degeneration, No heme Attached, patch of pavingstone at 0600, mild reticular degeneration, No  heme           Refraction     Manifest Refraction (Auto)       Sphere Cylinder Axis Dist VA   Right [place]   20/20   Left [place] +0.50 175 20/20           IMAGING AND PROCEDURES  Imaging and Procedures for 11/16/17  OCT, Retina - OU - Both Eyes       Right Eye Quality was good. Central Foveal Thickness: 317. Progression has no prior data. Findings include normal foveal contour, no IRF, no SRF, retinal drusen , outer retinal atrophy.   Left Eye Quality was good. Central Foveal Thickness: 307. Progression has no prior data. Findings include normal foveal contour, no IRF, no SRF, retinal drusen , pigment epithelial detachment, outer retinal atrophy, vitreomacular adhesion (Patchy ORA centrally).   Notes *Images captured and stored on drive  Diagnosis / Impression: NFP, No IRF/SRF OU No DME OU Nonexudative ARMD OU OS: patchy ORA centrally  Clinical management:  See below  Abbreviations: NFP - Normal foveal profile. CME - cystoid macular edema. PED - pigment epithelial detachment. IRF - intraretinal fluid. SRF - subretinal fluid. EZ - ellipsoid zone. ERM - epiretinal membrane. ORA - outer retinal atrophy. ORT - outer retinal tubulation. SRHM - subretinal hyper-reflective material               ASSESSMENT/PLAN:    ICD-10-CM   1. Intermediate stage nonexudative age-related macular degeneration of both eyes  H35.3132 OCT, Retina - OU - Both Eyes    2. 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 Long term (current) use of oral hypoglycemic drugs  Z79.84     4. Long-term (current) use of injectable non-insulin antidiabetic drugs  Z79.85     5. Essential hypertension  I10     6. Hypertensive retinopathy of both eyes  H35.033     7. Pseudophakia, both eyes  Z96.1       1. Non-Exudative ARMD OU -- stable  - Intermediate stage (OS > OD)  - scattered drusen and early ORA  - OCT shows patchy OU (OS > OD)  - BCVA OD 20/20, OS 20/20 from  20/25  - continue amsler grid monitoring  - f/u 6-9 months for repeat DFE, OCT  2-4. Diabetes mellitus, type 2 without retinopathy  - last a1c 6.8 on 09.23.24  - The incidence, risk factors for progression, natural history and treatment options for diabetic retinopathy  were discussed with patient.    - The need for close monitoring of blood glucose, blood pressure, and serum lipids, avoiding cigarette or any type of tobacco, and the need for long term follow up was also discussed with patient.  - monitor   5,6. Hypertensive retinopathy OU - discussed importance of tight BP control - monitor  7. Pseudophakia OU  - s/p CE/IOL (Dr. Candi Chafe: OD (toric): 09.14.23, OS: 08.31.23)  - IOLs in good position, doing well  - monitor  Ophthalmic Meds Ordered this visit:  No orders of the defined types were placed in this encounter.    Return for f/u 6-9 months non-exu ARMD OU, DFE, OCT.  There are no Patient Instructions on file for this visit.   Explained the diagnoses, plan, and follow up with the patient and they expressed understanding.  Patient expressed understanding of the importance of proper follow up care.   This document serves as a record of services personally performed by Jeanice Millard, MD, PhD. It was created on their behalf by Mayola Specking, COA an ophthalmic technician. The creation of this record is the provider's dictation and/or activities during the visit.   Electronically signed by: Carrington Clack, COT  01/21/24  11:16 PM   This document serves as a record of services personally performed by Jeanice Millard, MD, PhD. It was created on their behalf by Morley Arabia. Bevin Bucks, OA an ophthalmic technician. The creation of this record is the provider's dictation and/or activities during the visit.    Electronically signed by: Morley Arabia. Bevin Bucks, OA 01/21/24 11:16 PM  Jeanice Millard, M.D., Ph.D. Diseases & Surgery of the Retina and Vitreous [facility]  I  have reviewed the above documentation for accuracy and completeness, and I agree with the above. Jeanice Millard, M.D., Ph.D. 01/21/24 11:17 PM   Abbreviations: M myopia (nearsighted); A astigmatism; H hyperopia (farsighted); P presbyopia; Mrx spectacle prescription;  CTL contact lenses; OD right eye; OS left eye; OU both eyes  XT exotropia; ET esotropia; PEK punctate epithelial keratitis; PEE punctate epithelial erosions; DES dry eye syndrome; MGD meibomian gland dysfunction; ATs artificial tears; PFAT's preservative free artificial tears; NSC nuclear sclerotic cataract; PSC posterior subcapsular cataract; ERM epi-retinal membrane; PVD posterior vitreous detachment; RD retinal detachment; DM diabetes mellitus; DR diabetic retinopathy; NPDR non-proliferative diabetic retinopathy; PDR proliferative diabetic retinopathy; CSME clinically significant macular edema; DME diabetic macular edema; dbh dot blot hemorrhages; CWS cotton wool spot;  POAG primary open angle glaucoma; C/D cup-to-disc ratio; HVF humphrey visual field; GVF goldmann visual field; OCT optical coherence tomography; IOP intraocular pressure; BRVO Branch retinal vein occlusion; CRVO central retinal vein occlusion; CRAO central retinal artery occlusion; BRAO branch retinal artery occlusion; RT retinal tear; SB scleral buckle; PPV pars plana vitrectomy; VH Vitreous hemorrhage; PRP panretinal laser photocoagulation; IVK intravitreal kenalog; VMT vitreomacular traction; MH Macular hole;  NVD neovascularization of the disc; NVE neovascularization elsewhere; AREDS age related eye disease study; ARMD age related macular degeneration; POAG primary open angle glaucoma; EBMD epithelial/anterior basement membrane dystrophy; ACIOL anterior chamber intraocular lens; IOL intraocular lens; PCIOL posterior chamber intraocular lens; Phaco/IOL phacoemulsification with intraocular lens placement; PRK photorefractive keratectomy; LASIK laser assisted in situ  keratomileusis; HTN hypertension; DM diabetes mellitus; COPD chronic obstructive pulmonary disease

## 2024-01-07 ENCOUNTER — Ambulatory Visit: Admitting: Nurse Practitioner

## 2024-01-17 ENCOUNTER — Ambulatory Visit (INDEPENDENT_AMBULATORY_CARE_PROVIDER_SITE_OTHER): Payer: Medicare Other | Admitting: Ophthalmology

## 2024-01-17 ENCOUNTER — Encounter (INDEPENDENT_AMBULATORY_CARE_PROVIDER_SITE_OTHER): Payer: Self-pay | Admitting: Ophthalmology

## 2024-01-17 DIAGNOSIS — H35033 Hypertensive retinopathy, bilateral: Secondary | ICD-10-CM

## 2024-01-17 DIAGNOSIS — Z7985 Long-term (current) use of injectable non-insulin antidiabetic drugs: Secondary | ICD-10-CM

## 2024-01-17 DIAGNOSIS — E119 Type 2 diabetes mellitus without complications: Secondary | ICD-10-CM

## 2024-01-17 DIAGNOSIS — Z7984 Long term (current) use of oral hypoglycemic drugs: Secondary | ICD-10-CM | POA: Diagnosis not present

## 2024-01-17 DIAGNOSIS — Z961 Presence of intraocular lens: Secondary | ICD-10-CM

## 2024-01-17 DIAGNOSIS — I10 Essential (primary) hypertension: Secondary | ICD-10-CM

## 2024-01-17 DIAGNOSIS — H353132 Nonexudative age-related macular degeneration, bilateral, intermediate dry stage: Secondary | ICD-10-CM

## 2024-02-08 ENCOUNTER — Ambulatory Visit: Admitting: Nurse Practitioner

## 2024-02-08 ENCOUNTER — Other Ambulatory Visit (HOSPITAL_BASED_OUTPATIENT_CLINIC_OR_DEPARTMENT_OTHER): Payer: Self-pay | Admitting: Nurse Practitioner

## 2024-02-08 DIAGNOSIS — T7840XA Allergy, unspecified, initial encounter: Secondary | ICD-10-CM

## 2024-03-07 ENCOUNTER — Encounter: Payer: Self-pay | Admitting: Nurse Practitioner

## 2024-03-08 NOTE — Telephone Encounter (Signed)
 PAP Ozempic received, pt informed

## 2024-03-09 ENCOUNTER — Encounter: Payer: Self-pay | Admitting: Nurse Practitioner

## 2024-03-21 ENCOUNTER — Encounter: Payer: Self-pay | Admitting: Nurse Practitioner

## 2024-04-18 ENCOUNTER — Encounter: Payer: Self-pay | Admitting: Nurse Practitioner

## 2024-05-05 ENCOUNTER — Ambulatory Visit

## 2024-05-16 ENCOUNTER — Ambulatory Visit: Admitting: Nurse Practitioner

## 2024-05-16 ENCOUNTER — Encounter: Payer: Self-pay | Admitting: Nurse Practitioner

## 2024-05-16 VITALS — BP 124/82 | HR 86 | Ht 62.0 in | Wt 165.6 lb

## 2024-05-16 DIAGNOSIS — F5101 Primary insomnia: Secondary | ICD-10-CM

## 2024-05-16 DIAGNOSIS — E1159 Type 2 diabetes mellitus with other circulatory complications: Secondary | ICD-10-CM

## 2024-05-16 DIAGNOSIS — Z0000 Encounter for general adult medical examination without abnormal findings: Secondary | ICD-10-CM | POA: Diagnosis not present

## 2024-05-16 DIAGNOSIS — E118 Type 2 diabetes mellitus with unspecified complications: Secondary | ICD-10-CM

## 2024-05-16 DIAGNOSIS — E66811 Obesity, class 1: Secondary | ICD-10-CM | POA: Diagnosis not present

## 2024-05-16 DIAGNOSIS — E785 Hyperlipidemia, unspecified: Secondary | ICD-10-CM

## 2024-05-16 DIAGNOSIS — M8589 Other specified disorders of bone density and structure, multiple sites: Secondary | ICD-10-CM

## 2024-05-16 DIAGNOSIS — E538 Deficiency of other specified B group vitamins: Secondary | ICD-10-CM

## 2024-05-16 DIAGNOSIS — E1169 Type 2 diabetes mellitus with other specified complication: Secondary | ICD-10-CM

## 2024-05-16 DIAGNOSIS — M25562 Pain in left knee: Secondary | ICD-10-CM

## 2024-05-16 DIAGNOSIS — K5909 Other constipation: Secondary | ICD-10-CM

## 2024-05-16 DIAGNOSIS — E559 Vitamin D deficiency, unspecified: Secondary | ICD-10-CM | POA: Diagnosis not present

## 2024-05-16 DIAGNOSIS — I152 Hypertension secondary to endocrine disorders: Secondary | ICD-10-CM

## 2024-05-16 DIAGNOSIS — D691 Qualitative platelet defects: Secondary | ICD-10-CM

## 2024-05-16 LAB — LIPID PANEL

## 2024-05-16 MED ORDER — TRAZODONE HCL 100 MG PO TABS: 100.0000 mg | ORAL_TABLET | Freq: Every evening | ORAL | 11 refills | Status: AC | PRN

## 2024-05-16 NOTE — Progress Notes (Signed)
 05/16/2024   Vitals:  BP 124/82   Pulse 86   Ht 5' 2 (1.575 m)   Wt 165 lb 9.6 oz (75.1 kg)   BMI 30.29 kg/m   Body mass index is 30.29 kg/m. Maryse Brierley is a 68 y.o. female who presents for Subsequent Medicare Annual Wellness Exam  Care Team Members: Current Providers as of 05/16/2024 PCP: Oris Camie BRAVO, NP Encounter Provider: Oris Camie BRAVO, NP, starting on Tue May 16, 2024 12:00 AM Referring Provider: Oris Camie BRAVO, NP, starting on Tue May 16, 2024 12:00 AM Attending Provider: Oris Camie BRAVO, NP, starting on Tue Oct 26, 2023  9:13 AM (Active) Nurse Practitioner: Oris Camie BRAVO, NP, starting on Tue May 16, 2024  8:17 AM (Active)   Method of visit:  in person In the event virtual visit conducted, the patient consented to a virtual visit. Patient consented to have virtual visit and was identified by two identifiers.  Encounter participants: Patient: Jeannene Tschetter - located [facility] Patient Visit Location: In Office Nurse/Provider: Camie CHARLENA Oris - located Virtual Visit Location Provider: Office/Clinic Others (if applicable): patient only  History of Present Illness Jermeka Schlotterbeck is a 68 year old female who presents for an annual wellness exam and medication check.  She has been experiencing left knee pain for about a week and a half, primarily on the lateral side, sometimes extending down the back of the leg, causing stiffness. The pain began after helping a neighbor with yard work. She uses Biofreeze for relief and occasionally wears a knee brace. She reports that she has not noticed any swelling in her feet or ankles.  She has had a runny nose for the last couple of days, which she attributes to a weather change. She recalls a recent possible COVID-19 exposure from her husband, who returned from a mission trip with symptoms, but she has not tested positive for COVID-19.  Her blood sugar levels in the morning are typically between 140 and 150  mg/dL. She experiences symptoms of hypoglycemia when her blood sugar drops to around 80 mg/dL. She is currently taking Ozempic , which will no longer be provided for free under patient assistance due to Medicare changes.  She reports occasional difficulty swallowing, particularly when lying down, which she associates with possible reflux. She also experiences cold feet at night, which she manages by wearing footies to bed.  She takes trazodone  at a low dose to help with sleep, using it about three times a week when she needs to ensure adequate rest. She also takes lisinopril , omeprazole , simvastatin , and vitamin D , and rarely uses estradiol  vaginal cream.  She is active with yard work and church activities, which vary with the seasons. She reports no swelling in her feet or ankles, no palpitations, and no significant changes in bowel habits. She reports that background noise makes it difficult to hear, but has no new hearing or vision changes. She reports occasional numbness or tingling in her foot, but not as severe as previously experienced. No vaginal bleeding.  Review of Systems:  Neuro: Denies difficulty remembering daily tasks, people, or places.  Ear: Denies difficulty hearing or need to increase volume on television or telephone to hear Eye: Denies visual changes, difficulty reading normal print, or visual field deficits. Cardiac: Denies chest pain, palpitations, dizziness, shortness of breath, pain in lower extremities, or night time waking with shortness of breath. Lung: Denies shortness of breath, difficulty breathing, chronic cough, or dizziness.  GI: Denies changes  in bowel habits, blood in stool, difficulty passing stool, decreased intake of food or drink, nausea, or vomiting.  GU: Denies changes in urinary habits, dark urine, malodorous urine, increased or decreased urination, or urinary incontinence.  MSK: Denies weakness in extremities, difficulty walking, difficulty grasping, or new  MSK pain.  Skin: Denies changes to the skin, fragile skin, or increased bruising.  Constitution: Denies fatigue, weakness, or confusion.   Patient rating of health: same as this time last year  Clinical Intake: Pre-visit preparation completed: Yes  Pain : No/denies pain     BMI - recorded: 30.29 Nutritional Status: BMI > 30  Obese Nutritional Risks: None Diabetes: Yes CBG done?: No Did pt. bring in CBG monitor from home?: No  Activities of Daily Living: Independent Ambulation: Independent Medication Administration: Independent Home Management: Independent  Barriers to Care Management & Learning: None  Do you feel unsafe in your current relationship?: No Do you feel physically threatened by others?: No Anyone hurting you at home, work, or school?: No Information provided on Community resources: No  How often do you need to have someone help you when you read instructions, pamphlets, or other written materials from your doctor or pharmacy?: 1 - Never What is the last grade level you completed in school?: 4 year degree  Interpreter Needed?: No  Information entered by :: Camie Oris PIETY     05/16/2024    8:19 AM 11/17/2022    9:44 AM 03/30/2022    8:34 AM 11/14/2021    1:14 PM 03/26/2020    7:21 AM  Advanced Directives  Does Patient Have a Medical Advance Directive? No No No No No  Would patient like information on creating a medical advance directive? No - Patient declined  No - Patient declined No - Patient declined No - Patient declined    Social Determinants of Health SDOH Screenings   Food Insecurity: Patient Declined (05/16/2024)  Housing: Patient Declined (05/16/2024)  Transportation Needs: Patient Declined (05/16/2024)  Utilities: Patient Declined (05/16/2024)  Alcohol Screen: Low Risk  (11/16/2022)  Depression (PHQ2-9): Low Risk  (05/16/2024)  Financial Resource Strain: Patient Declined (05/16/2024)  Physical Activity: Insufficiently Active (05/16/2024)   Social Connections: Patient Declined (05/16/2024)  Stress: Patient Declined (05/16/2024)  Tobacco Use: Medium Risk (05/16/2024)  Health Literacy: Patient Declined (05/16/2024)     Functional Status Survey: Is the patient deaf or have difficulty hearing?: Yes (with back ground noise) Does the patient have difficulty seeing, even when wearing glasses/contacts?: No Does the patient have difficulty concentrating, remembering, or making decisions?: No Does the patient have difficulty walking or climbing stairs?: No Does the patient have difficulty dressing or bathing?: No Does the patient have difficulty doing errands alone such as visiting a doctor's office or shopping?: No   Annual Goal:  Goals      Patient Stated     11/17/2022, stay healthy     Weight (lb) < 200 lb (90.7 kg)     And to also cut carb intake.          Advanced Directives <no information>     Fall Risk    05/16/2024    8:18 AM 10/26/2023    8:15 AM 04/26/2023    8:51 AM 11/16/2022    6:07 PM 08/25/2022    8:34 AM  Fall Risk   Falls in the past year? 0 0 0 0 0  Number falls in past yr: 0 0 0 0 0  Injury with Fall? 0 0 0 0 0  Risk for fall due to : No Fall Risks No Fall Risks No Fall Risks Medication side effect No Fall Risks  Follow up Falls evaluation completed Falls evaluation completed  Falls prevention discussed;Education provided;Falls evaluation completed Falls evaluation completed      Data saved with a previous flowsheet row definition    Medicare Risk  Medicare Risk at Home - 05/16/24 9178     Any stairs in or around the home? Yes    If so, are there any without handrails? No    Home free of loose throw rugs in walkways, pet beds, electrical cords, etc? Yes    Adequate lighting in your home to reduce risk of falls? Yes    Life alert? No    Use of a cane, walker or w/c? No    Grab bars in the bathroom? Yes    Shower chair or bench in shower? Yes    Elevated toilet seat or a handicapped  toilet? No           Cognitive Function Normal: Yes Exam Completed:         11/17/2022    9:45 AM 11/14/2021    1:12 PM  6CIT Screen  What Year? 0 points 0 points  What month? 0 points 0 points  What time? 0 points 0 points  Count back from 20 0 points 0 points  Months in reverse 0 points 0 points  Repeat phrase 2 points 2 points  Total Score 2 points 2 points    Mini-Cog - 05/16/24 0818     Normal clock drawing test? yes    How many words correct? 2          Depression Screening    05/16/2024    8:32 AM 05/16/2024    8:18 AM 10/26/2023    8:15 AM 11/17/2022    9:45 AM 02/19/2022    2:33 PM  Depression screen PHQ 2/9  Decreased Interest 0 0 0 0 0  Down, Depressed, Hopeless 0 0 0 0 0  PHQ - 2 Score 0 0 0 0 0  Altered sleeping    0   Tired, decreased energy    0   Change in appetite    0   Feeling bad or failure about yourself     0   Trouble concentrating    0   Moving slowly or fidgety/restless    0   Suicidal thoughts    0   PHQ-9 Score    0   Difficult doing work/chores    Not difficult at all      Activities of Daily Living    05/16/2024    8:20 AM  In your present state of health, do you have any difficulty performing the following activities:  Hearing? 1  Comment with back ground noise  Vision? 0  Difficulty concentrating or making decisions? 0  Walking or climbing stairs? 0  Dressing or bathing? 0  Doing errands, shopping? 0  Preparing Food and eating ? N  Using the Toilet? N  In the past six months, have you accidently leaked urine? N  Do you have problems with loss of bowel control? N  Managing your Medications? N  Managing your Finances? N  Housekeeping or managing your Housekeeping? N    Tobacco Social History   Tobacco Use  Smoking Status Former   Current packs/day: 0.00   Average packs/day: 0.3 packs/day for 30.0 years (7.5 ttl pk-yrs)   Types: Cigarettes  Start date: 08/02/1985   Quit date: 08/03/2015   Years since  quitting: 8.7  Smokeless Tobacco Former     Counseling given: Not Answered   Hospitalizations in the Past Year: none  ED Visits in the Past Year: No  Surgeries in the Past Year: No   History    Medication List Current Meds  Medication Sig   Ascorbic Acid (VITAMIN C) 1000 MG tablet Take 1,000 mg by mouth daily.   aspirin EC 81 MG tablet Take 81 mg by mouth daily.   Cholecalciferol (VITAMIN D ) 125 MCG (5000 UT) CAPS Take 5,000 Units by mouth daily.   cyanocobalamin  (VITAMIN B12) 1000 MCG tablet Take 1 tablet (1,000 mcg total) by mouth daily.   fexofenadine  (ALLEGRA ) 180 MG tablet TAKE 1 TABLET BY MOUTH ONCE DAILY FOR ALLERGIES   lisinopril  (ZESTRIL ) 10 MG tablet Take 1 tablet (10 mg total) by mouth daily.   Multiple Vitamins-Minerals (ICAPS AREDS 2 PO) Take by mouth.   omeprazole  (PRILOSEC) 40 MG capsule Take 1 capsule (40 mg total) by mouth daily.   Polyethyl Glycol-Propyl Glycol (SYSTANE) 0.4-0.3 % SOLN Apply 1 drop to eye daily.   Red Yeast Rice Extract (RED YEAST RICE PO) Take 1 tablet by mouth daily.    Semaglutide , 2 MG/DOSE, (OZEMPIC , 2 MG/DOSE,) 8 MG/3ML SOPN Inject 2 mg into the skin once a week.   simvastatin  (ZOCOR ) 10 MG tablet Take 1 tablet (10 mg total) by mouth at bedtime.   SYRINGE-NEEDLE, DISP, 3 ML (LUER LOCK SAFETY SYRINGES) 25G X 5/8 3 ML MISC 1 Needle by Does not apply route every 30 (thirty) days.   Zinc 50 MG CAPS    [DISCONTINUED] estradiol  (ESTRACE ) 0.1 MG/GM vaginal cream Place 0.5g nightly for two weeks then twice a week after   [DISCONTINUED] traZODone  (DESYREL ) 50 MG tablet Take 0.5-1 tablets (25-50 mg total) by mouth at bedtime as needed for sleep.     Immunizations Immunization History  Administered Date(s) Administered   Hepatitis B 10/25/2015, 11/26/2015, 07/01/2016   Pneumococcal Polysaccharide-23 07/15/2020   Tdap 03/27/2011, 03/07/2019   Zoster Recombinant(Shingrix) 04/05/2015     Screening Tests Health Maintenance  Topic Date Due    Diabetic kidney evaluation - Urine ACR  04/25/2024   HEMOGLOBIN A1C  04/27/2024   Mammogram  07/02/2024   Influenza Vaccine  08/03/2028 (Originally 03/03/2024)   Diabetic kidney evaluation - eGFR measurement  10/25/2024   OPHTHALMOLOGY EXAM  11/23/2024   Colonoscopy  03/26/2025   FOOT EXAM  05/16/2025   Medicare Annual Wellness (AWV)  05/16/2025   DEXA SCAN  08/19/2027   DTaP/Tdap/Td (3 - Td or Tdap) 03/06/2029   Hepatitis C Screening  Completed   Meningococcal B Vaccine  Aged Out   Pneumococcal Vaccine: 50+ Years  Discontinued   Hepatitis B Vaccines 19-59 Average Risk  Discontinued   COVID-19 Vaccine  Discontinued   Zoster Vaccines- Shingrix  Discontinued    Health Maintenance Screenings  Health Maintenance Topics with due status: Overdue     Topic Date Due   Diabetic kidney evaluation - Urine ACR 04/25/2024   Health Maintenance Topics with due status: Due On     Topic Date Due   HEMOGLOBIN A1C 04/27/2024   Health Maintenance Topics with due status: Due Soon     Topic Date Due   Mammogram 07/02/2024     Past Medical History:  Diagnosis Date   Acute swimmer's ear of right side 11/24/2021   Allergy    Anxiety  Asthma    Cataract    OU   Cervicogenic headache 04/08/2022   Diabetes mellitus without complication (HCC)    Encounter for medical examination to establish care 04/25/2021   GERD (gastroesophageal reflux disease)    Hypertension    Leg pain, posterior, left 04/25/2021   Macular degeneration    Dry OU   Neck and shoulder pain 04/08/2022   Numbness and tingling of foot 04/26/2023   Osteopenia    Pruritus 11/18/2022   Pulsatile tinnitus of left ear 11/13/2022   Rosacea    Subacute vaginitis 04/25/2021   Wheezing 08/25/2022   Past Surgical History:  Procedure Laterality Date   BIOPSY  03/26/2020   Procedure: BIOPSY;  Surgeon: Eartha Angelia Sieving, MD;  [facility]: [facility];  Service: Gastroenterology;;   COLONOSCOPY WITH PROPOFOL  N/A  03/26/2020   Procedure: COLONOSCOPY WITH PROPOFOL ;  Surgeon: Eartha Angelia Sieving, MD;  Location: [facility];  Service: Gastroenterology;  Laterality: N/A;  815   CYST EXCISION     off vocal cord    POLYPECTOMY  03/26/2020   Procedure: POLYPECTOMY;  Surgeon: Eartha Angelia, Sieving, MD;  Location: [facility];  Service: Gastroenterology;;   Family History  Problem Relation Age of Onset   COPD Mother    Alcohol abuse Father    Diabetes Father    Heart disease Father    Hyperlipidemia Father    Hypertension Father    Cancer Sister    Diabetes Sister    Hyperlipidemia Sister    Hypertension Sister    Breast cancer Sister 6   Hypertension Brother    Alcohol abuse Maternal Aunt    Cancer Maternal Aunt    Alcohol abuse Maternal Uncle    Hypertension Maternal Uncle    Cancer Maternal Uncle    Alcohol abuse Paternal Aunt    Cancer Paternal Aunt    Alcohol abuse Paternal Uncle    Cancer Paternal Uncle    Diabetes Maternal Grandmother    Stroke Maternal Grandmother    Cancer Maternal Grandmother    Heart disease Maternal Grandfather    Diabetes Paternal Grandmother    Cancer Maternal Aunt    Cancer Maternal Aunt    Cancer Maternal Aunt    Cancer Maternal Aunt    Cancer Maternal Aunt    Social History   Socioeconomic History   Marital status: Married    Spouse name: Not on file   Number of children: Not on file   Years of education: Not on file   Highest education level: Not on file  Occupational History   Not on file  Tobacco Use   Smoking status: Former    Current packs/day: 0.00    Average packs/day: 0.3 packs/day for 30.0 years (7.5 ttl pk-yrs)    Types: Cigarettes    Start date: 08/02/1985    Quit date: 08/03/2015    Years since quitting: 8.7   Smokeless tobacco: Former  Building services engineer status: Never Used  Substance and Sexual Activity   Alcohol use: Not Currently    Alcohol/week: 12.0 standard drinks of alcohol    Types: 12 Glasses of wine  per week    Comment: beer occassionally   Drug use: No   Sexual activity: Yes  Other Topics Concern   Not on file  Social History Narrative   Not on file   Social Drivers of Health   Financial Resource Strain: Patient Declined (05/16/2024)   Overall Financial Resource Strain (CARDIA)  Difficulty of Paying Living Expenses: Patient declined  Food Insecurity: Patient Declined (05/16/2024)   Hunger Vital Sign    Worried About Running Out of Food in the Last Year: Patient declined    Ran Out of Food in the Last Year: Patient declined  Transportation Needs: Patient Declined (05/16/2024)   PRAPARE - Administrator, Civil Service (Medical): Patient declined    Lack of Transportation (Non-Medical): Patient declined  Physical Activity: Insufficiently Active (05/16/2024)   Exercise Vital Sign    Days of Exercise per Week: 2 days    Minutes of Exercise per Session: 20 min  Stress: Patient Declined (05/16/2024)   [employer] of Occupational Health - Occupational Stress Questionnaire    Feeling of Stress: Patient declined  Social Connections: Patient Declined (05/16/2024)   Social Connection and Isolation Panel    Frequency of Communication with Friends and Family: Patient declined    Frequency of Social Gatherings with Friends and Family: Patient declined    Attends Religious Services: Patient declined    Active Member of Clubs or Organizations: Patient declined    Attends Banker Meetings: Patient declined    Marital Status: Patient declined    Outpatient Encounter Medications as of 05/16/2024  Medication Sig   Ascorbic Acid (VITAMIN C) 1000 MG tablet Take 1,000 mg by mouth daily.   aspirin EC 81 MG tablet Take 81 mg by mouth daily.   Cholecalciferol (VITAMIN D ) 125 MCG (5000 UT) CAPS Take 5,000 Units by mouth daily.   cyanocobalamin  (VITAMIN B12) 1000 MCG tablet Take 1 tablet (1,000 mcg total) by mouth daily.   fexofenadine  (ALLEGRA ) 180 MG tablet  TAKE 1 TABLET BY MOUTH ONCE DAILY FOR ALLERGIES   lisinopril  (ZESTRIL ) 10 MG tablet Take 1 tablet (10 mg total) by mouth daily.   Multiple Vitamins-Minerals (ICAPS AREDS 2 PO) Take by mouth.   omeprazole  (PRILOSEC) 40 MG capsule Take 1 capsule (40 mg total) by mouth daily.   Polyethyl Glycol-Propyl Glycol (SYSTANE) 0.4-0.3 % SOLN Apply 1 drop to eye daily.   Red Yeast Rice Extract (RED YEAST RICE PO) Take 1 tablet by mouth daily.    Semaglutide , 2 MG/DOSE, (OZEMPIC , 2 MG/DOSE,) 8 MG/3ML SOPN Inject 2 mg into the skin once a week.   simvastatin  (ZOCOR ) 10 MG tablet Take 1 tablet (10 mg total) by mouth at bedtime.   SYRINGE-NEEDLE, DISP, 3 ML (LUER LOCK SAFETY SYRINGES) 25G X 5/8 3 ML MISC 1 Needle by Does not apply route every 30 (thirty) days.   Zinc 50 MG CAPS    [DISCONTINUED] estradiol  (ESTRACE ) 0.1 MG/GM vaginal cream Place 0.5g nightly for two weeks then twice a week after   [DISCONTINUED] traZODone  (DESYREL ) 50 MG tablet Take 0.5-1 tablets (25-50 mg total) by mouth at bedtime as needed for sleep.   traZODone  (DESYREL ) 100 MG tablet Take 1 tablet (100 mg total) by mouth at bedtime as needed for sleep.   [DISCONTINUED] Continuous Glucose Sensor (FREESTYLE LIBRE 3 PLUS SENSOR) MISC Change sensor every 15 days. (Patient not taking: Reported on 05/16/2024)   [DISCONTINUED] Flaxseed, Linseed, (FLAX SEEDS PO) Take 1 tablet by mouth daily. (Patient not taking: Reported on 05/16/2024)   No facility-administered encounter medications on file as of 05/16/2024.    Physical Exam: Yes-completed Physical Exam Vitals and nursing note reviewed.  Constitutional:      General: She is not in acute distress.    Appearance: Normal appearance. She is not ill-appearing.  HENT:  Head: Normocephalic and atraumatic.     Right Ear: Tympanic membrane normal.     Left Ear: Tympanic membrane normal.     Nose: Nose normal.     Mouth/Throat:     Mouth: Mucous membranes are moist.     Pharynx: Oropharynx is  clear.  Eyes:     Extraocular Movements: Extraocular movements intact.     Conjunctiva/sclera: Conjunctivae normal.     Pupils: Pupils are equal, round, and reactive to light.  Neck:     Vascular: No carotid bruit.  Cardiovascular:     Rate and Rhythm: Normal rate and regular rhythm.     Pulses: Normal pulses.     Heart sounds: Normal heart sounds.  Pulmonary:     Effort: Pulmonary effort is normal.     Breath sounds: Normal breath sounds.  Abdominal:     General: Bowel sounds are normal. There is no distension.     Palpations: Abdomen is soft.     Tenderness: There is no abdominal tenderness. There is no right CVA tenderness, left CVA tenderness or guarding.  Musculoskeletal:        General: Tenderness present. Normal range of motion.     Cervical back: Normal range of motion. No tenderness.     Right lower leg: No edema.     Left lower leg: No edema.     Comments: Lateral left knee tenderness. ROM intact. No swelling.   Lymphadenopathy:     Cervical: No cervical adenopathy.  Skin:    General: Skin is warm and dry.     Capillary Refill: Capillary refill takes less than 2 seconds.  Neurological:     General: No focal deficit present.     Mental Status: She is alert and oriented to person, place, and time.     Sensory: No sensory deficit.     Motor: No weakness.     Coordination: Coordination normal.  Psychiatric:        Mood and Affect: Mood normal.        Behavior: Behavior normal.     PLAN Left knee lateral ligament strain Left knee pain for about a week and a half, likely due to lateral ligament strain. Pain is localized to the lateral aspect of the knee , possibly due to micro tears from recent physical activity. No significant swelling or instability. - Advise icing the knee for 20 minutes as needed - Recommend use of Biofreeze for pain relief - Suggest considering a knee brace for support during activities - Refer to Dr. Vita for sports medicine evaluation if  symptoms persist or worsen  Type 2 diabetes mellitus Blood sugars are generally well-managed, with occasional lows around 80 mg/dL. Assistance for Ozempic  is ending. Discussed potential switch to Mounjaro, considering family history and cost implications. Referral to [facility] pharmacist, Jon, to help evaluate cost differences and determine if we can get improved coverage without PAP.  - Contact Angela, the pharmacist, to aid in cost differences between Ozempic  and Mounjaro - Monitor blood sugars with any symptoms of hypoglycemia - Continue current diabetes management plan  Insomnia Current trazodone  dose (50 mg) is insufficient for sleep. Discussed increasing the dose to improve sleep quality. - Increase trazodone  dose to 75-100 mg as needed - Send prescription for trazodone  100 mg to [facility]  Constipation Intermittent constipation managed with stool softeners and warm prune juice. Bowel habits have returned to normal with current management. - Continue current management with stool softeners and warm prune juice as  needed  Obesity, class 1 Maintaining activity levels with yard work and church activities. - Encourage continued physical activity  Vitamin D  deficiency Vitamin D  is managed with over-the-counter supplements.  Adult Wellness Visit Routine annual wellness visit. Blood pressure is well-controlled. No significant changes in overall health status. Discussed general health and wellness, including activity levels and upcoming colonoscopy. - Continue current medications as prescribed - Schedule colonoscopy by August next year - Maintain current activity levels  Hypertension Continue current medication management. BP well controlled at this time with no concerning symptoms.   Hyperlipidemia Continue current management. Labs pending today.   Abnormal Platelets Recheck platelets today. No signs of bleeding present.   Exercise Activities and Dietary Recommendations - choose a  type of activity I enjoy such as biking, gardening, team sports, walking - go out for a short walk before breakfast, after dinner or both - plan family outings that include physical activity, like hiking, backpacking, swimming - replace a coffee break with a brisk 10-minute walk; ask a friend to go with me - take the stairs instead of the elevator Cardiac and Carb modified diet  Fall Prevention - add more outdoor lighting - always use handrails on the stairs - always wear shoes or slippers with non-slip sole - get at least 10 minutes of activity every day - keep a flashlight by the bed - remove, or use a non-slip pad, with my throw rugs  Orders Placed This Encounter  Procedures   CBC with Differential/Platelet   CMP14+EGFR   Hemoglobin A1c   Lipid panel   Microalbumin/Creatinine Ratio, Urine   VITAMIN D  25 Hydroxy (Vit-D Deficiency, Fractures)   Vitamin B12   AMB Referral [facility] Care Management    Referral Priority:   Routine    Referral Type:   Consultation    Referral Reason:   Care Coordination    Number of Visits Requested:   1   HM Diabetes Foot Exam     I have personally reviewed and noted the following in the patient's chart:   Medical and social history Use of alcohol, tobacco or illicit drugs  Current medications and supplements Functional ability and status Nutritional status Physical activity Advanced directives List of other physicians Hospitalizations, surgeries, and ER visits in previous 12 months Vitals Screenings to include cognitive, depression, and falls Referrals and appointments  In addition, I have reviewed and discussed with patient certain preventive protocols, quality metrics, and best practice recommendations. A written personalized care plan for preventive services as well as general preventive health recommendations were provided to patient.   Camie CHARLENA Doing, NP  05/16/2024

## 2024-05-16 NOTE — Assessment & Plan Note (Signed)
 Current trazodone  dose (50 mg) is insufficient for sleep. Discussed increasing the dose to improve sleep quality. - Increase trazodone  dose to 75-100 mg as needed - Send prescription for trazodone  100 mg to [facility]

## 2024-05-16 NOTE — Assessment & Plan Note (Signed)
 Blood sugars are generally well-managed, with occasional lows around 80 mg/dL. Assistance for Ozempic  is ending. Discussed potential switch to Mounjaro, considering family history and cost implications. Referral to [facility] pharmacist, Jon, to help evaluate cost differences and determine if we can get improved coverage without PAP.  - Contact Angela, the pharmacist, to aid in cost differences between Ozempic  and Mounjaro - Monitor blood sugars with any symptoms of hypoglycemia - Continue current diabetes management plan

## 2024-05-16 NOTE — Assessment & Plan Note (Signed)
 Intermittent constipation managed with stool softeners and warm prune juice. Bowel habits have returned to normal with current management. Suspect GLP-1 has contributed to the change. No alarm symptoms. She is due for colonoscopy 03/2025. - Continue current management with stool softeners and warm prune juice as needed

## 2024-05-16 NOTE — Patient Instructions (Addendum)
 Have reached out to [facility], the pharmacist, to have her check on the cost differences between Ozempic  and Mounjaro with your insurance. She may reach out to you to go over options and help find the best option for you.    For all adult patients, I recommend A well balanced diet low in saturated fats, cholesterol, and moderation in carbohydrates.   This can be as simple as monitoring portion sizes and cutting back on sugary beverages such as soda and juice to start with.    Daily water  consumption of at least 64 ounces.  Physical activity at least 180 minutes per week, if just starting out.   This can be as simple as taking the stairs instead of the elevator and walking 2-3 laps around the office  purposefully every day.   STD protection, partner selection, and regular testing if high risk.  Limited consumption of alcoholic beverages if alcohol is consumed.  For women, I recommend no more than 7 alcoholic beverages per week, spread out throughout the week.  Avoid binge drinking or consuming large quantities of alcohol in one setting.   Please let me know if you feel you may need help with reduction or quitting alcohol consumption.   Avoidance of nicotine, if used.  Please let me know if you feel you may need help with reduction or quitting nicotine use.   Daily mental health attention.  This can be in the form of 5 minute daily meditation, prayer, journaling, yoga, reflection, etc.   Purposeful attention to your emotions and mental state can significantly improve your overall wellbeing  and  Health.  Please know that I am here to help you with all of your health care goals and am happy to work with you to find a solution that works best for you.  The greatest advice I have received with any changes in life are to take it one step at a time, that even means if all you can focus on is the next 60 seconds, then do that and celebrate your victories.  With any changes in life, you will have  set backs, and that is OK. The important thing to remember is, if you have a set back, it is not a failure, it is an opportunity to try again!  Health Maintenance Recommendations Screening Testing Mammogram Every 1 -2 years based on history and risk factors Starting at age 80 Pap Smear Ages 21-39 every 3 years Ages 81-65 every 5 years with HPV testing More frequent testing may be required based on results and history Colon Cancer Screening Every 1-10 years based on test performed, risk factors, and history Starting at age 63 Bone Density Screening Every 2-10 years based on history Starting at age 67 for women Recommendations for men differ based on medication usage, history, and risk factors AAA Screening One time ultrasound Men 59-61 years old who have every smoked Lung Cancer Screening Low Dose Lung CT every 12 months Age 48-80 years with a 30 pack-year smoking history who still smoke or who have quit within the last 15 years  Screening Labs Routine  Labs: Complete Blood Count (CBC), Complete Metabolic Panel (CMP), Cholesterol (Lipid Panel) Every 6-12 months based on history and medications May be recommended more frequently based on current conditions or previous results Hemoglobin A1c Lab Every 3-12 months based on history and previous results Starting at age 65 or earlier with diagnosis of diabetes, high cholesterol, BMI >26, and/or risk factors Frequent monitoring for patients with diabetes  to ensure blood sugar control Thyroid Panel (TSH w/ T3 & T4) Every 6 months based on history, symptoms, and risk factors May be repeated more often if on medication HIV One time testing for all patients 19 and older May be repeated more frequently for patients with increased risk factors or exposure Hepatitis C One time testing for all patients 5 and older May be repeated more frequently for patients with increased risk factors or exposure Gonorrhea, Chlamydia Every 12 months for  all sexually active persons 13-24 years Additional monitoring may be recommended for those who are considered high risk or who have symptoms PSA Men 58-76 years old with risk factors Additional screening may be recommended from age 27-69 based on risk factors, symptoms, and history  Vaccine Recommendations Tetanus Booster All adults every 10 years Flu Vaccine All patients 6 months and older every year COVID Vaccine All patients 12 years and older Initial dosing with booster May recommend additional booster based on age and health history HPV Vaccine 2 doses all patients age 67-26 Dosing may be considered for patients over 26 Shingles Vaccine (Shingrix) 2 doses all adults 55 years and older Pneumonia (Pneumovax 23) All adults 65 years and older May recommend earlier dosing based on health history Pneumonia (Prevnar 53) All adults 65 years and older Dosed 1 year after Pneumovax 23  Additional Screening, Testing, and Vaccinations may be recommended on an individualized basis based on family history, health history, risk factors, and/or exposure.

## 2024-05-16 NOTE — Assessment & Plan Note (Addendum)
 Left lateral and posterior lateral knee pain for about a week and a half, likely due to strain of the lateral and lateral collateral ligaments. Pain is localized to the lateral aspect of the knee wrapping around posteriorly, possibly due to micro tears from recent physical activity. No significant swelling or instability. Negative knee exam for laxity.  - Advise icing the knee for 20 minutes as needed - Recommend use of Biofreeze for pain relief - Suggest considering a knee brace for support during activities - Refer to Dr. Vita for sports medicine evaluation if symptoms persist or worsen

## 2024-05-17 ENCOUNTER — Telehealth: Payer: Self-pay

## 2024-05-17 LAB — CMP14+EGFR
ALT: 33 IU/L — ABNORMAL HIGH (ref 0–32)
AST: 17 IU/L (ref 0–40)
Albumin: 4.1 g/dL (ref 3.9–4.9)
Alkaline Phosphatase: 118 IU/L (ref 49–135)
BUN/Creatinine Ratio: 21 (ref 12–28)
BUN: 14 mg/dL (ref 8–27)
Bilirubin Total: 0.3 mg/dL (ref 0.0–1.2)
CO2: 24 mmol/L (ref 20–29)
Calcium: 9.8 mg/dL (ref 8.7–10.3)
Chloride: 100 mmol/L (ref 96–106)
Creatinine, Ser: 0.67 mg/dL (ref 0.57–1.00)
Globulin, Total: 2.3 g/dL (ref 1.5–4.5)
Glucose: 125 mg/dL — ABNORMAL HIGH (ref 70–99)
Potassium: 4.9 mmol/L (ref 3.5–5.2)
Sodium: 138 mmol/L (ref 134–144)
Total Protein: 6.4 g/dL (ref 6.0–8.5)
eGFR: 95 mL/min/1.73 (ref >59)

## 2024-05-17 LAB — LIPID PANEL
Cholesterol, Total: 119 mg/dL (ref 100–199)
HDL: 33 mg/dL — AB (ref >39)
LDL CALC COMMENT:: 3.6 ratio (ref 0.0–4.4)
LDL Chol Calc (NIH): 55 mg/dL (ref 0–99)
Triglycerides: 187 mg/dL — AB (ref 0–149)
VLDL Cholesterol Cal: 31 mg/dL (ref 5–40)

## 2024-05-17 LAB — CBC WITH DIFFERENTIAL/PLATELET
Basophils Absolute: 0.1 x10E3/uL (ref 0.0–0.2)
Basos: 1 %
EOS (ABSOLUTE): 0.4 x10E3/uL (ref 0.0–0.4)
Eos: 6 %
Hematocrit: 44.4 % (ref 34.0–46.6)
Hemoglobin: 14.4 g/dL (ref 11.1–15.9)
Immature Grans (Abs): 0 x10E3/uL (ref 0.0–0.1)
Immature Granulocytes: 0 %
Lymphocytes Absolute: 2.6 x10E3/uL (ref 0.7–3.1)
Lymphs: 34 %
MCH: 31.2 pg (ref 26.6–33.0)
MCHC: 32.4 g/dL (ref 31.5–35.7)
MCV: 96 fL (ref 79–97)
Monocytes Absolute: 0.3 x10E3/uL (ref 0.1–0.9)
Monocytes: 4 %
Neutrophils Absolute: 4.2 x10E3/uL (ref 1.4–7.0)
Neutrophils: 55 %
Platelets: 518 x10E3/uL — ABNORMAL HIGH (ref 150–450)
RBC: 4.62 x10E6/uL (ref 3.77–5.28)
RDW: 12.3 % (ref 11.7–15.4)
WBC: 7.6 x10E3/uL (ref 3.4–10.8)

## 2024-05-17 LAB — HEMOGLOBIN A1C
Est. average glucose Bld gHb Est-mCnc: 154 mg/dL
Hgb A1c MFr Bld: 7 % — ABNORMAL HIGH (ref 4.8–5.6)

## 2024-05-17 LAB — MICROALBUMIN / CREATININE URINE RATIO
Creatinine, Urine: 109.3 mg/dL
Microalb/Creat Ratio: 6 mg/g{creat} (ref 0–29)
Microalbumin, Urine: 6.2 ug/mL

## 2024-05-17 LAB — VITAMIN B12: Vitamin B-12: 1411 pg/mL — AB (ref 232–1245)

## 2024-05-17 LAB — VITAMIN D 25 HYDROXY (VIT D DEFICIENCY, FRACTURES): Vit D, 25-Hydroxy: 55.1 ng/mL (ref 30.0–100.0)

## 2024-05-17 NOTE — Progress Notes (Signed)
 Care Guide Pharmacy Note  05/17/2024 Name: Jeanne Shaw MRN: [id number] DOB: February 02, 1956  Referred By: Oris Camie BRAVO, NP Reason for referral: Complex Care Management and Call Attempt #1 (Successful initial outreach scheduled with PHARM D- Jon)   [name] is a 68 y.o. year old female who is a primary care patient of Early, Camie BRAVO, NP.  [name] was referred to the pharmacist for assistance related to: DMII  Successful contact was made with the patient to discuss pharmacy services including being ready for the pharmacist to call at least 5 minutes before the scheduled appointment time and to have medication bottles and any blood pressure readings ready for review. The patient agreed to meet with the pharmacist via telephone visit on (date/time).  05/18/24 @ 11 AM  Leotis Cloria Davene Salome Romell Ralph Valrie, [facility] Guide  Direct Dial: [phone]  Fax [phone]

## 2024-05-17 NOTE — Progress Notes (Signed)
 Cardiology Office Note:    Date:  05/22/2024   ID:  Jeanne, Shaw [birth year], MRN [id number]  PCP:  Oris Camie BRAVO, NP   [facility] Providers Cardiologist:  None     Referring MD: Oris Camie BRAVO, NP   Chief Complaint  Patient presents with   Palpitations    History of Present Illness:    Jeanne Shaw is a 68 y.o. female seen at the request of Camie Oris NP for evaluation of SVT. She has a history of DM and HTN. She reports that earlier this year she was experiencing some palpitations. An event monitor was placed showing few nonsustained runs of SVT and isolated PVCs. Symptoms correlated with PVCs. She states she quit drinking green tea and reduced her Vitamin D  dose and symptoms resolved. Denies any chest pain, dyspnea. BP, DM, HLD all under good control.   Past Medical History:  Diagnosis Date   Acute swimmer's ear of right side 11/24/2021   Allergy    Anxiety    Asthma    Cataract    OU   Cervicogenic headache 04/08/2022   Diabetes mellitus without complication (HCC)    Encounter for medical examination to establish care 04/25/2021   GERD (gastroesophageal reflux disease)    Hypertension    Leg pain, posterior, left 04/25/2021   Macular degeneration    Dry OU   Neck and shoulder pain 04/08/2022   Numbness and tingling of foot 04/26/2023   Osteopenia    Pruritus 11/18/2022   Pulsatile tinnitus of left ear 11/13/2022   Rosacea    Subacute vaginitis 04/25/2021   Wheezing 08/25/2022    Past Surgical History:  Procedure Laterality Date   BIOPSY  03/26/2020   Procedure: BIOPSY;  Surgeon: Eartha Angelia Sieving, MD;  [facility];  Service: Gastroenterology;;   COLONOSCOPY WITH PROPOFOL  N/A 03/26/2020   Procedure: COLONOSCOPY WITH PROPOFOL ;  Surgeon: Eartha Angelia Sieving, MD;  [facility]: [facility];  Service: Gastroenterology;  Laterality: N/A;  815   CYST EXCISION     off vocal cord    POLYPECTOMY  03/26/2020    Procedure: POLYPECTOMY;  Surgeon: Eartha Angelia, Sieving, MD;  [facility]: [facility];  Service: Gastroenterology;;    Current Medications: Current Meds  Medication Sig   Ascorbic Acid (VITAMIN C) 1000 MG tablet Take 1,000 mg by mouth daily.   aspirin EC 81 MG tablet Take 81 mg by mouth daily.   Cholecalciferol (VITAMIN D ) 125 MCG (5000 UT) CAPS Take 5,000 Units by mouth daily.   cyanocobalamin  (VITAMIN B12) 1000 MCG tablet Take 1 tablet (1,000 mcg total) by mouth daily.   fexofenadine  (ALLEGRA ) 180 MG tablet TAKE 1 TABLET BY MOUTH ONCE DAILY FOR ALLERGIES   lisinopril  (ZESTRIL ) 10 MG tablet Take 1 tablet (10 mg total) by mouth daily.   Multiple Vitamins-Minerals (ICAPS AREDS 2 PO) Take by mouth.   omeprazole  (PRILOSEC) 40 MG capsule Take 1 capsule (40 mg total) by mouth daily.   Polyethyl Glycol-Propyl Glycol (SYSTANE) 0.4-0.3 % SOLN Apply 1 drop to eye daily.   Red Yeast Rice Extract (RED YEAST RICE PO) Take 1 tablet by mouth daily.    Semaglutide , 2 MG/DOSE, (OZEMPIC , 2 MG/DOSE,) 8 MG/3ML SOPN Inject 2 mg into the skin once a week.   simvastatin  (ZOCOR ) 10 MG tablet Take 1 tablet (10 mg total) by mouth at bedtime.   SYRINGE-NEEDLE, DISP, 3 ML (LUER LOCK SAFETY SYRINGES) 25G X 5/8 3 ML MISC 1 Needle  by Does not apply route every 30 (thirty) days.   traZODone  (DESYREL ) 100 MG tablet Take 1 tablet (100 mg total) by mouth at bedtime as needed for sleep.   Zinc 50 MG CAPS      Allergies:   Farxiga  [dapagliflozin ] and Hydrocodone   Social History   Socioeconomic History   Marital status: Married    Spouse name: Not on file   Number of children: 0   Years of education: Not on file   Highest education level: Not on file  Occupational History   Not on file  Tobacco Use   Smoking status: Former    Current packs/day: 0.00    Average packs/day: 0.3 packs/day for 30.0 years (7.5 ttl pk-yrs)    Types: Cigarettes    Start date: 08/02/1985    Quit date: 08/03/2015    Years since  quitting: 8.8   Smokeless tobacco: Former  Building services engineer status: Never Used  Substance and Sexual Activity   Alcohol use: Not Currently    Comment: beer occassionally   Drug use: No   Sexual activity: Yes  Other Topics Concern   Not on file  Social History Narrative   Not on file   Social Drivers of Health   Financial Resource Strain: Patient Declined (05/16/2024)   Overall Financial Resource Strain (CARDIA)    Difficulty of Paying Living Expenses: Patient declined  Food Insecurity: Patient Declined (05/16/2024)   Hunger Vital Sign    Worried About Running Out of Food in the Last Year: Patient declined    Ran Out of Food in the Last Year: Patient declined  Transportation Needs: Patient Declined (05/16/2024)   PRAPARE - Administrator, Civil Service (Medical): Patient declined    Lack of Transportation (Non-Medical): Patient declined  Physical Activity: Insufficiently Active (05/16/2024)   Exercise Vital Sign    Days of Exercise per Week: 2 days    Minutes of Exercise per Session: 20 min  Stress: Patient Declined (05/16/2024)   [employer] of Occupational Health - Occupational Stress Questionnaire    Feeling of Stress: Patient declined  Social Connections: Patient Declined (05/16/2024)   Social Connection and Isolation Panel    Frequency of Communication with Friends and Family: Patient declined    Frequency of Social Gatherings with Friends and Family: Patient declined    Attends Religious Services: Patient declined    Database administrator or Organizations: Patient declined    Attends Engineer, structural: Patient declined    Marital Status: Patient declined     Family History: The patient's family history includes Alcohol abuse in her father, maternal aunt, maternal uncle, paternal aunt, and paternal uncle; Breast cancer (age of onset: 49) in her sister; COPD in her mother; Cancer in her maternal aunt, maternal aunt, maternal aunt,  maternal aunt, maternal aunt, maternal aunt, maternal grandmother, maternal uncle, paternal aunt, paternal uncle, and sister; Diabetes in her father, maternal grandmother, paternal grandmother, and sister; Heart disease in her father and maternal grandfather; Hyperlipidemia in her father and sister; Hypertension in her brother, father, maternal uncle, and sister; Stroke in her maternal grandmother.  ROS:   Please see the history of present illness.     All other systems reviewed and are negative.  EKGs/Labs/Other Studies Reviewed:    The following studies were reviewed today: Event monitor 12/21/23: Study Highlights Show Result ComparisonNSR with sinus tachy (134/min), ave 86/min. Sustained SVT, up to 2.5 minutes with ave HR of 134/min  No VT or atrial fib. No prolonged pauses Rare PVC's (1.1%) and PAC's (<1%) Symptoms submitted associated with sinus tachy and PVC's. Patch Wear Time:  6 days and 23 hours (2025-05-05T17:33:18-0400 to 2025-05-12T17:29:58-0400)     EKG Interpretation Date/Time:  Monday May 22 2024 09:34:08 EDT Ventricular Rate:  85 PR Interval:  162 QRS Duration:  76 QT Interval:  370 QTC Calculation: 440 R Axis:   55  Text Interpretation: Normal sinus rhythm Low voltage QRS No previous ECGs available Confirmed by [place], Zennie Ayars [phone]) on 05/22/2024 9:37:59 AM    Recent Labs: 05/16/2024: ALT 33; BUN 14; Creatinine, Ser 0.67; Hemoglobin 14.4; Platelets 518; Potassium 4.9; Sodium 138  Recent Lipid Panel    Component Value Date/Time   CHOL 119 05/16/2024 0934   TRIG 187 (H) 05/16/2024 0934   HDL 33 (L) 05/16/2024 0934   CHOLHDL 3.6 05/16/2024 0934   CHOLHDL 3.7 07/15/2020 0824   LDLCALC 55 05/16/2024 0934   LDLCALC 66 07/15/2020 0824   EKG Interpretation Date/Time:  Monday May 22 2024 09:34:08 EDT Ventricular Rate:  85 PR Interval:  162 QRS Duration:  76 QT Interval:  370 QTC Calculation: 440 R Axis:   55  Text Interpretation: Normal sinus rhythm  Low voltage QRS No previous ECGs available Confirmed by [place], Beatric Fulop [phone]) on 05/22/2024 9:37:59 AM     Risk Assessment/Calculations:                Physical Exam:    VS:  BP 122/74 (BP Location: Left Arm, Patient Position: Sitting, Cuff Size: Normal)   Pulse 88   Ht 5' 2 (1.575 m)   Wt 169 lb 12.8 oz (77 kg)   SpO2 96%   BMI 31.06 kg/m     Wt Readings from Last 3 Encounters:  05/22/24 169 lb 12.8 oz (77 kg)  05/16/24 165 lb 9.6 oz (75.1 kg)  12/24/23 164 lb 9.6 oz (74.7 kg)     GEN:  Well nourished, well developed in no acute distress HEENT: Normal NECK: No JVD; No carotid bruits LYMPHATICS: No lymphadenopathy CARDIAC: RRR, no murmurs, rubs, gallops RESPIRATORY:  Clear to auscultation without rales, wheezing or rhonchi  ABDOMEN: Soft, non-tender, non-distended MUSCULOSKELETAL:  No edema; No deformity  SKIN: Warm and dry NEUROLOGIC:  Alert and oriented x 3 PSYCHIATRIC:  Normal affect   ASSESSMENT:    1. SVT (supraventricular tachycardia)   2. Heart palpitations   3. PVC (premature ventricular contraction)    PLAN:    In order of problems listed above:  Palpitations due to PVCs. Now asymptomatic. Likely aggravated by caffeine. No further work up or treatment needed at this time Nonsustained PAT. No symptoms DM HTN controlled.  HLD well controlled on statin.          Can follow up PRN  Medication Adjustments/Labs and Tests Ordered: Current medicines are reviewed at length with the patient today.  Concerns regarding medicines are outlined above.  Orders Placed This Encounter  Procedures   EKG 12-Lead   No orders of the defined types were placed in this encounter.   There are no Patient Instructions on file for this visit.   Signed, Zyairah Wacha [place], MD  05/22/2024 9:45 AM    [facility]

## 2024-05-18 ENCOUNTER — Other Ambulatory Visit (INDEPENDENT_AMBULATORY_CARE_PROVIDER_SITE_OTHER)

## 2024-05-18 DIAGNOSIS — E118 Type 2 diabetes mellitus with unspecified complications: Secondary | ICD-10-CM

## 2024-05-18 NOTE — Progress Notes (Signed)
 05/18/2024 Name: Jeanne Shaw MRN: [id number] DOB: [birth year]  Chief Complaint  Patient presents with   Medication Management   Diabetes    Jeanne Shaw is a 68 y.o. year old female who presented for a telephone visit.   They were referred to the pharmacist by their PCP for assistance in managing medication access.    Subjective:  Care Team: Primary Care Provider: Oris Camie BRAVO, NP ; Next Scheduled Visit: 11/14/24  Medication Access/Adherence  Current Pharmacy:  [facility] [address], [place] - [address] [place] [address] Phone: [phone] Fax: [phone]  [email or web address] - [place], [place] - [phone] [place] [address] Phone: [phone] Fax: [phone]   Patient reports affordability concerns with their medications: Yes  - concern of ability to stay on ozempic  or switch to mounjaro once ozempic  PAP ends Patient reports access/transportation concerns to their pharmacy: No  Patient reports adherence concerns with their medications:  No     Diabetes:  Current medications: Ozempic  2mg  Medications tried in the past: Farxiga , Glipizide , Januvia    Patient doing well on Ozempic  but aware PAP is ending for 2026. She is wondering if Mounjaro or Ozempic  will be affordable next year. Reports her insurance plan will stay the same.  Lab Results  Component Value Date   MICRALBCREAT 6 05/16/2024   MICRALBCREAT 6 04/26/2023   MICRALBCREAT 5 01/19/2020   MICRALBCREAT 8 12/05/2018   Last eye exam:  Lab Results  Component Value Date   HMDIABEYEEXA No Retinopathy 11/24/2023   Last foot exam: 05/16/2024 Tobacco Use:  Tobacco Use: Medium Risk (05/16/2024)   Patient History    Smoking Tobacco Use: Former    Smokeless Tobacco Use: Former    Passive Exposure: Not on file     Objective:  Lab Results  Component Value Date   HGBA1C 7.0 (H) 05/16/2024    Lab  Results  Component Value Date   CREATININE 0.67 05/16/2024   BUN 14 05/16/2024   NA 138 05/16/2024   K 4.9 05/16/2024   CL 100 05/16/2024   CO2 24 05/16/2024    Lab Results  Component Value Date   CHOL 119 05/16/2024   HDL 33 (L) 05/16/2024   LDLCALC 55 05/16/2024   TRIG 187 (H) 05/16/2024   CHOLHDL 3.6 05/16/2024    Medications Reviewed Today     Reviewed by Lionell Jon DEL, RPH (Pharmacist) on 05/18/24 at 1058  Med List Status: <None>   Medication Order Taking? Sig Documenting Provider Last Dose Status Informant  Ascorbic Acid (VITAMIN C) 1000 MG tablet [id number]  Take 1,000 mg by mouth daily. [provider]  Active Self  aspirin EC 81 MG tablet [id number]  Take 81 mg by mouth daily. [provider]  Active Self  Cholecalciferol (VITAMIN D ) 125 MCG (5000 UT) CAPS [id number]  Take 5,000 Units by mouth daily. Early, Sara E, NP  Active   cyanocobalamin  (VITAMIN B12) 1000 MCG tablet [id number]  Take 1 tablet (1,000 mcg total) by mouth daily. Early, Sara E, NP  Active   fexofenadine  (ALLEGRA ) 180 MG tablet [id number]  TAKE 1 TABLET BY MOUTH ONCE DAILY FOR ALLERGIES Early, Sara E, NP  Active   lisinopril  (ZESTRIL ) 10 MG tablet [id number]  Take 1 tablet (10 mg total) by mouth daily. Early, Sara E, NP  Active   Multiple Vitamins-Minerals (ICAPS AREDS 2 PO) [id number]  Take by mouth. [provider]  Active  omeprazole  (PRILOSEC) 40 MG capsule [id number]  Take 1 capsule (40 mg total) by mouth daily. Early, Sara E, NP  Active   Polyethyl Glycol-Propyl Glycol (SYSTANE) 0.4-0.3 % SOLN [id number]  Apply 1 drop to eye daily. [provider]  Active   Red Yeast Rice Extract (RED YEAST RICE PO) [id number]  Take 1 tablet by mouth daily.  [provider]  Active Self  Semaglutide , 2 MG/DOSE, (OZEMPIC , 2 MG/DOSE,) 8 MG/3ML SOPN [id number]  Inject 2 mg into the skin once a week. Early, Sara E, NP  Active   simvastatin  (ZOCOR ) 10 MG tablet [id number]  Take 1  tablet (10 mg total) by mouth at bedtime. Early, Sara E, NP  Active   SYRINGE-NEEDLE, DISP, 3 ML (LUER LOCK SAFETY SYRINGES) 25G X 5/8 3 ML MISC [id number]  1 Needle by Does not apply route every 30 (thirty) days. Early, Sara E, NP  Active   traZODone  (DESYREL ) 100 MG tablet [id number]  Take 1 tablet (100 mg total) by mouth at bedtime as needed for sleep. Oris Camie BRAVO, NP  Active   Zinc 50 MG CAPS [id number]   [provider]  Active               Assessment/Plan:   Diabetes: - Currently controlled; goal A1c <7%. Cardiorenal risk reduction is optimized.. Blood pressure is at goal <130/80. LDL is at goal.  - Plan to continue Ozempic  2mg  through end of year via patient assistance. Scheduled f/u for early next year to assess affordability of this med vs mounjaro or other alternatives -Advised patient contact her insurance company to see if they can provide 2026 pricing for ozempic  and mounjaro as test claim functions for 2026 are not available at this time.  Follow Up Plan: 08/10/24  Jon VEAR [place], PharmD Clinical Pharmacist [phone]

## 2024-05-19 ENCOUNTER — Telehealth: Payer: Self-pay

## 2024-05-19 NOTE — Telephone Encounter (Signed)
 Received refill request from [facility] Ozempic , filled and faxed to provider office for provider to sign and date can be fax to [facility] or fax back to [phone]

## 2024-05-22 ENCOUNTER — Encounter: Payer: Self-pay | Admitting: Cardiology

## 2024-05-22 ENCOUNTER — Ambulatory Visit: Attending: Cardiology | Admitting: Cardiology

## 2024-05-22 VITALS — BP 122/74 | HR 88 | Ht 62.0 in | Wt 169.8 lb

## 2024-05-22 DIAGNOSIS — R002 Palpitations: Secondary | ICD-10-CM | POA: Diagnosis not present

## 2024-05-22 DIAGNOSIS — I493 Ventricular premature depolarization: Secondary | ICD-10-CM | POA: Insufficient documentation

## 2024-05-22 DIAGNOSIS — I471 Supraventricular tachycardia, unspecified: Secondary | ICD-10-CM | POA: Insufficient documentation

## 2024-05-22 NOTE — Patient Instructions (Signed)

## 2024-05-24 ENCOUNTER — Ambulatory Visit: Payer: Self-pay | Admitting: Nurse Practitioner

## 2024-05-26 NOTE — Telephone Encounter (Signed)
 Received provider portion [facility] Ozempic, faxed to [facility] today.

## 2024-06-07 NOTE — Telephone Encounter (Signed)
 PAP OZEMPIC received, left message for pt

## 2024-06-08 ENCOUNTER — Telehealth: Payer: Self-pay

## 2024-06-08 NOTE — Telephone Encounter (Signed)
 Copied from CRM [id number]. Topic: General - Other >> Jun 08, 2024 11:52 AM Jeanne Shaw wrote: Reason for CRM: Patient got a message her Ozempic  is at the office. States will come next Tuesday to pick up.  Patient can be reached at [phone]

## 2024-06-08 NOTE — Telephone Encounter (Signed)
 ok

## 2024-07-30 ENCOUNTER — Other Ambulatory Visit (HOSPITAL_BASED_OUTPATIENT_CLINIC_OR_DEPARTMENT_OTHER): Payer: Self-pay | Admitting: Nurse Practitioner

## 2024-07-30 DIAGNOSIS — T7840XA Allergy, unspecified, initial encounter: Secondary | ICD-10-CM

## 2024-08-10 ENCOUNTER — Other Ambulatory Visit: Payer: Self-pay

## 2024-08-10 DIAGNOSIS — E118 Type 2 diabetes mellitus with unspecified complications: Secondary | ICD-10-CM

## 2024-08-10 NOTE — Progress Notes (Signed)
" ° °  08/10/2024 Name: Jeanne Shaw MRN: [id number] DOB: Mar 01, 1956  Chief Complaint  Patient presents with   Medication Management   Medication Assistance   Diabetes    Jeanne Shaw is a 69 y.o. year old female who presented for a telephone visit.   They were referred to the pharmacist by their PCP for assistance in managing medication access.    Subjective:  Care Team: Primary Care Provider: Oris Camie BRAVO, NP ; Next Scheduled Visit: 11/14/24  Medication Access/Adherence  Current Pharmacy:  [facility] [address] [place] - [address] [place] [address] Phone: [phone] Fax: [phone]  [email or web address] - [place], [place] - [phone] [facility] [address] [facility] [address] [place] [address] Phone: [phone] Fax: [phone]   Patient reports affordability concerns with their medications: Yes  - concern of ability to stay on ozempic  or switch to mounjaro once ozempic  PAP ends Patient reports access/transportation concerns to their pharmacy: No  Patient reports adherence concerns with their medications:  No     Diabetes:  Current medications: Ozempic  2mg  Medications tried in the past: Farxiga , Glipizide , Januvia    Patient doing well on Ozempic  but aware PAP is ending for 2026. She is wondering if Mounjaro or Ozempic  will be affordable next year. Reports her insurance plan will stay the same.  Lab Results  Component Value Date   MICRALBCREAT 6 05/16/2024   MICRALBCREAT 6 04/26/2023   MICRALBCREAT 5 01/19/2020   MICRALBCREAT 8 12/05/2018   Last eye exam:  Lab Results  Component Value Date   HMDIABEYEEXA No Retinopathy 11/24/2023   Last foot exam: 05/16/2024 Tobacco Use:  Tobacco Use: Medium Risk (05/22/2024)   Patient History    Smoking Tobacco Use: Former    Smokeless Tobacco Use: Former    Passive Exposure: Not on file     Objective:  Lab Results  Component Value Date   HGBA1C 7.0 (H)  05/16/2024    Lab Results  Component Value Date   CREATININE 0.67 05/16/2024   BUN 14 05/16/2024   NA 138 05/16/2024   K 4.9 05/16/2024   CL 100 05/16/2024   CO2 24 05/16/2024    Lab Results  Component Value Date   CHOL 119 05/16/2024   HDL 33 (L) 05/16/2024   LDLCALC 55 05/16/2024   TRIG 187 (H) 05/16/2024   CHOLHDL 3.6 05/16/2024    Medications Reviewed Today   Medications were not reviewed in this encounter       Assessment/Plan:   Diabetes: - Currently controlled; goal A1c <7%. Cardiorenal risk reduction is optimized.. Blood pressure is at goal <130/80. LDL is at goal.  - Plan to continue Ozempic  2mg  until supply runs out. Reports she has enough on hand through April-May -Unable to run test claim for Mounjaro/Ozempic  through insurance. Will contact insurance company prior to next f/u to check pricing  Follow Up Plan: 10/05/24  Jon VEAR Lindau, PharmD Clinical Pharmacist [phone]  "

## 2024-08-14 NOTE — Progress Notes (Signed)
 " [facility] - Clinic Note  08/18/2024     CHIEF COMPLAINT Patient presents for Retina Follow Up  HISTORY OF PRESENT ILLNESS: Jeanne Shaw is a 69 y.o. female who presents to the clinic today for:   HPI     Retina Follow Up   Patient presents with  Dry AMD.  In both eyes.  Severity is moderate.  Duration of 7 months.  Since onset it is stable.  I, the attending physician,  performed the HPI with the patient and updated documentation appropriately.        Comments   7 month Retina eval. Patient states vision seems about the same      Last edited by Valdemar Rogue, MD on 08/28/2024 12:28 PM.    Pt states she doesn't feel like there have been vision changes.   Referring physician: Oris Camie BRAVO, NP [address] [place],  [place] [address]  HISTORICAL INFORMATION:   Selected notes from the MEDICAL RECORD NUMBER Referred by M. Dixon, PA-C for DM exam Ocular Hx-  PMH- DM (A1C 7.4 on 04.01.19), HTN, asthma,     CURRENT MEDICATIONS: Current Outpatient Medications (Ophthalmic Drugs)  Medication Sig   Polyethyl Glycol-Propyl Glycol (SYSTANE) 0.4-0.3 % SOLN Apply 1 drop to eye daily.   No current facility-administered medications for this visit. (Ophthalmic Drugs)   Current Outpatient Medications (Other)  Medication Sig   Ascorbic Acid (VITAMIN C) 1000 MG tablet Take 1,000 mg by mouth daily.   aspirin EC 81 MG tablet Take 81 mg by mouth daily.   Cholecalciferol (VITAMIN D ) 125 MCG (5000 UT) CAPS Take 5,000 Units by mouth daily.   cyanocobalamin  (VITAMIN B12) 1000 MCG tablet Take 1 tablet (1,000 mcg total) by mouth daily.   fexofenadine  (ALLEGRA ) 180 MG tablet TAKE 1 TABLET BY MOUTH ONCE DAILY FOR ALLERGIES   lisinopril  (ZESTRIL ) 10 MG tablet Take 1 tablet (10 mg total) by mouth daily.   Multiple Vitamins-Minerals (ICAPS AREDS 2 PO) Take by mouth.   omeprazole  (PRILOSEC) 40 MG capsule Take 1 capsule (40 mg total) by mouth daily.   Red  Yeast Rice Extract (RED YEAST RICE PO) Take 1 tablet by mouth daily.    Semaglutide , 2 MG/DOSE, (OZEMPIC , 2 MG/DOSE,) 8 MG/3ML SOPN Inject 2 mg into the skin once a week.   simvastatin  (ZOCOR ) 10 MG tablet Take 1 tablet (10 mg total) by mouth at bedtime.   SYRINGE-NEEDLE, DISP, 3 ML (LUER LOCK SAFETY SYRINGES) 25G X 5/8 3 ML MISC 1 Needle by Does not apply route every 30 (thirty) days.   traZODone  (DESYREL ) 100 MG tablet Take 1 tablet (100 mg total) by mouth at bedtime as needed for sleep.   Zinc 50 MG CAPS    No current facility-administered medications for this visit. (Other)   REVIEW OF SYSTEMS: ROS   Positive for: Gastrointestinal, Skin, Endocrine, Eyes, Respiratory Negative for: Constitutional, Neurological, Genitourinary, Musculoskeletal, HENT, Cardiovascular, Psychiatric, Allergic/Imm, Heme/Lymph Last edited by German Olam BRAVO, COT on 08/18/2024  7:53 AM.     ALLERGIES Allergies  Allergen Reactions   Farxiga  [Dapagliflozin ] Other (See Comments)    Mycotic infections   Hydrocodone Anxiety   PAST MEDICAL HISTORY Past Medical History:  Diagnosis Date   Acute swimmer's ear of right side 11/24/2021   Allergy    Anxiety    Asthma    Cataract    OU   Cervicogenic headache 04/08/2022   Diabetes mellitus without complication (HCC)    Encounter  for medical examination to establish care 04/25/2021   GERD (gastroesophageal reflux disease)    Hypertension    Leg pain, posterior, left 04/25/2021   Macular degeneration    Dry OU   Neck and shoulder pain 04/08/2022   Numbness and tingling of foot 04/26/2023   Osteopenia    Pruritus 11/18/2022   Pulsatile tinnitus of left ear 11/13/2022   Rosacea    Subacute vaginitis 04/25/2021   Wheezing 08/25/2022   Past Surgical History:  Procedure Laterality Date   BIOPSY  03/26/2020   Procedure: BIOPSY;  Surgeon: Eartha Angelia Sieving, MD;  [facility]: [facility];  Service: Gastroenterology;;   COLONOSCOPY WITH PROPOFOL  N/A  03/26/2020   Procedure: COLONOSCOPY WITH PROPOFOL ;  Surgeon: Eartha Angelia Sieving, MD;  Location: [facility];  Service: Gastroenterology;  Laterality: N/A;  815   CYST EXCISION     off vocal cord    POLYPECTOMY  03/26/2020   Procedure: POLYPECTOMY;  Surgeon: Eartha Angelia, Sieving, MD;  Location: [facility];  Service: Gastroenterology;;    FAMILY HISTORY Family History  Problem Relation Age of Onset   COPD Mother    Alcohol abuse Father    Diabetes Father    Heart disease Father    Hyperlipidemia Father    Hypertension Father    Cancer Sister    Diabetes Sister    Hyperlipidemia Sister    Hypertension Sister    Breast cancer Sister 86   Hypertension Brother    Alcohol abuse Maternal Aunt    Cancer Maternal Aunt    Alcohol abuse Maternal Uncle    Hypertension Maternal Uncle    Cancer Maternal Uncle    Alcohol abuse Paternal Aunt    Cancer Paternal Aunt    Alcohol abuse Paternal Uncle    Cancer Paternal Uncle    Diabetes Maternal Grandmother    Stroke Maternal Grandmother    Cancer Maternal Grandmother    Heart disease Maternal Grandfather    Diabetes Paternal Grandmother    Cancer Maternal Aunt    Cancer Maternal Aunt    Cancer Maternal Aunt    Cancer Maternal Aunt    Cancer Maternal Aunt    SOCIAL HISTORY Social History   Tobacco Use   Smoking status: Former    Current packs/day: 0.00    Average packs/day: 0.3 packs/day for 30.0 years (7.5 ttl pk-yrs)    Types: Cigarettes    Start date: 08/02/1985    Quit date: 08/03/2015    Years since quitting: 9.0   Smokeless tobacco: Former  Building Services Engineer status: Never Used  Substance Use Topics   Alcohol use: Not Currently    Comment: beer occassionally   Drug use: No       OPHTHALMIC EXAM:  Base Eye Exam     Visual Acuity (Snellen - Linear)       Right Left   Dist [place] 20/25 +2 20/25 +2   Dist ph [place] 20/NI 20/NI         Tonometry (Tonopen, 8:00 AM)       Right Left   Pressure 10 12          Pupils       Dark Light Shape React APD   Right 3 2 Round Brisk None   Left 3 2 Round Brisk None         Visual Fields (Counting fingers)       Left Right    Full Full  Extraocular Movement       Right Left    Full, Ortho Full, Ortho         Neuro/Psych     Oriented x3: Yes   Mood/Affect: Normal         Dilation     Both eyes: 1.0% Mydriacyl, 2.5% Phenylephrine @ 8:00 AM           Slit Lamp and Fundus Exam     Slit Lamp Exam       Right Left   Lids/Lashes Dermatochalasis - upper lid Dermatochalasis - upper lid   Conjunctiva/Sclera nasal and temporal pinguecula nasal and temporal pinguecula   Cornea well healed cataract wound, tear film debris, trace PEE well healed cataract wound, mild tear film debris, trace PEE   Anterior Chamber Deep and quiet Deep and quiet   Iris Round and dilated, No NVI Round and dilated, No NVI   Lens Toric PC IOL in good position with marks at 0400 and 1000, trace posterior capsular opacification PC IOL in good position, 1+ posterior capsular opacification   Anterior Vitreous Mild syneresis Mild syneresis         Fundus Exam       Right Left   Disc Pink and Sharp Pink and Sharp, Compact   C/D Ratio 0.3 0.2   Macula flat, blunted foveal reflex, mild RPE mottling and clumping, focal druse/pigment clump superonasal to fovea, No heme or edema Flat, Blunted foveal reflex, drusen, mild RPE mottling, clumping and early atrophy, +PEDs, No heme or edema   Vessels attenuated, Tortuous, mild copper wiring, milld AV crossing changes attenuated, mild tortuosity   Periphery Attached, focal peripheral drusen nasally, reticular degeneration, No heme Attached, patch of pavingstone at 0600, mild reticular degeneration, No heme           IMAGING AND PROCEDURES  Imaging and Procedures for 11/16/17  OCT, Retina - OU - Both Eyes       Right Eye Quality was good. Central Foveal Thickness: 307. Progression has been  stable. Findings include normal foveal contour, no IRF, no SRF, retinal drusen , outer retinal atrophy.   Left Eye Quality was good. Central Foveal Thickness: 320. Progression has been stable. Findings include normal foveal contour, no IRF, no SRF, retinal drusen , pigment epithelial detachment, outer retinal atrophy, vitreomacular adhesion (Patchy ORA centrally, focal SRHM nasal fovea).   Notes *Images captured and stored on drive  Diagnosis / Impression: NFP, No IRF/SRF OU No DME OU Nonexudative ARMD OU OS: patchy ORA centrally  Clinical management:  See below  Abbreviations: NFP - Normal foveal profile. CME - cystoid macular edema. PED - pigment epithelial detachment. IRF - intraretinal fluid. SRF - subretinal fluid. EZ - ellipsoid zone. ERM - epiretinal membrane. ORA - outer retinal atrophy. ORT - outer retinal tubulation. SRHM - subretinal hyper-reflective material             ASSESSMENT/PLAN:    ICD-10-CM   1. Intermediate stage nonexudative age-related macular degeneration of both eyes  H35.3132 OCT, Retina - OU - Both Eyes    2. 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 Long term (current) use of oral hypoglycemic drugs  Z79.84     4. Long-term (current) use of injectable non-insulin antidiabetic drugs  Z79.85     5. Essential hypertension  I10     6. Hypertensive retinopathy of both eyes  H35.033     7. Pseudophakia, both eyes  Z96.1  1. Non-Exudative ARMD OU -- stable  - Intermediate stage (OS > OD)  - scattered drusen and early ORA, focal SRHM nasal fovea  - OCT shows patchy ORA OU (OS > OD)  - BCVA OU decreased to 20/25 from 20/20  - continue amsler grid monitoring and AREDs  - f/u 2-3 months for repeat DFE, OCT  2-4. Diabetes mellitus, type 2 without retinopathy  - last a1c 7.0 on 10.14.25, 6.8 on 09.23.24  - The incidence, risk factors for progression, natural history and treatment options for diabetic retinopathy  were  discussed with patient.    - The need for close monitoring of blood glucose, blood pressure, and serum lipids, avoiding cigarette or any type of tobacco, and the need for long term follow up was also discussed with patient.  - monitor   5,6. Hypertensive retinopathy OU - discussed importance of tight BP control - monitor  7. Pseudophakia OU  - s/p CE/IOL (Dr. Octavia: OD (toric): 09.14.23, OS: 08.31.23)  - IOLs in good position, doing well  - monitor  Ophthalmic Meds Ordered this visit:  No orders of the defined types were placed in this encounter.    Return for 2-3 months non exu ARMD OU, DFE, OCT.  There are no Patient Instructions on file for this visit.   Explained the diagnoses, plan, and follow up with the patient and they expressed understanding.  Patient expressed understanding of the importance of proper follow up care.   This document serves as a record of services personally performed by Redell JUDITHANN Hans, MD, PhD. It was created on their behalf by Delon Newness COT, an ophthalmic technician. The creation of this record is the provider's dictation and/or activities during the visit.    Electronically signed by: Delon Newness COT 01.12.26 12:28 PM  This document serves as a record of services personally performed by Redell JUDITHANN Hans, MD, PhD. It was created on their behalf by Almetta Pesa, an ophthalmic technician. The creation of this record is the provider's dictation and/or activities during the visit.    Electronically signed by: Almetta Pesa, OA, 08/28/24  12:28 PM  Redell JUDITHANN Hans, M.D., Ph.D. Diseases & Surgery of the Retina and Vitreous [facility] Gastroenterology 08/18/2024   I have reviewed the above documentation for accuracy and completeness, and I agree with the above. Redell JUDITHANN Hans, M.D., Ph.D. 08/28/24 12:30 PM    Abbreviations: M myopia (nearsighted); A astigmatism; H hyperopia (farsighted); P presbyopia; Mrx spectacle  prescription;  CTL contact lenses; OD right eye; OS left eye; OU both eyes  XT exotropia; ET esotropia; PEK punctate epithelial keratitis; PEE punctate epithelial erosions; DES dry eye syndrome; MGD meibomian gland dysfunction; ATs artificial tears; PFAT's preservative free artificial tears; NSC nuclear sclerotic cataract; PSC posterior subcapsular cataract; ERM epi-retinal membrane; PVD posterior vitreous detachment; RD retinal detachment; DM diabetes mellitus; DR diabetic retinopathy; NPDR non-proliferative diabetic retinopathy; PDR proliferative diabetic retinopathy; CSME clinically significant macular edema; DME diabetic macular edema; dbh dot blot hemorrhages; CWS cotton wool spot; POAG primary open angle glaucoma; C/D cup-to-disc ratio; HVF humphrey visual field; GVF goldmann visual field; OCT optical coherence tomography; IOP intraocular pressure; BRVO Branch retinal vein occlusion; CRVO central retinal vein occlusion; CRAO central retinal artery occlusion; BRAO branch retinal artery occlusion; RT retinal tear; SB scleral buckle; PPV pars plana vitrectomy; VH Vitreous hemorrhage; PRP panretinal laser photocoagulation; IVK intravitreal kenalog; VMT vitreomacular traction; MH Macular hole;  NVD neovascularization of the disc; NVE neovascularization elsewhere; AREDS age  related eye disease study; ARMD age related macular degeneration; POAG primary open angle glaucoma; EBMD epithelial/anterior basement membrane dystrophy; ACIOL anterior chamber intraocular lens; IOL intraocular lens; PCIOL posterior chamber intraocular lens; Phaco/IOL phacoemulsification with intraocular lens placement; PRK photorefractive keratectomy; LASIK laser assisted in situ keratomileusis; HTN hypertension; DM diabetes mellitus; COPD chronic obstructive pulmonary disease "

## 2024-08-18 ENCOUNTER — Ambulatory Visit (INDEPENDENT_AMBULATORY_CARE_PROVIDER_SITE_OTHER): Admitting: Ophthalmology

## 2024-08-18 ENCOUNTER — Encounter (INDEPENDENT_AMBULATORY_CARE_PROVIDER_SITE_OTHER): Payer: Self-pay | Admitting: Ophthalmology

## 2024-08-18 DIAGNOSIS — H353132 Nonexudative age-related macular degeneration, bilateral, intermediate dry stage: Secondary | ICD-10-CM | POA: Diagnosis not present

## 2024-08-18 DIAGNOSIS — E119 Type 2 diabetes mellitus without complications: Secondary | ICD-10-CM

## 2024-08-18 DIAGNOSIS — Z7985 Long-term (current) use of injectable non-insulin antidiabetic drugs: Secondary | ICD-10-CM

## 2024-08-18 DIAGNOSIS — H35033 Hypertensive retinopathy, bilateral: Secondary | ICD-10-CM

## 2024-08-18 DIAGNOSIS — Z7984 Long term (current) use of oral hypoglycemic drugs: Secondary | ICD-10-CM | POA: Diagnosis not present

## 2024-08-18 DIAGNOSIS — Z961 Presence of intraocular lens: Secondary | ICD-10-CM | POA: Diagnosis not present

## 2024-08-18 DIAGNOSIS — I10 Essential (primary) hypertension: Secondary | ICD-10-CM

## 2024-08-28 ENCOUNTER — Encounter (INDEPENDENT_AMBULATORY_CARE_PROVIDER_SITE_OTHER): Payer: Self-pay | Admitting: Ophthalmology

## 2024-10-05 ENCOUNTER — Other Ambulatory Visit

## 2024-10-16 ENCOUNTER — Encounter (INDEPENDENT_AMBULATORY_CARE_PROVIDER_SITE_OTHER): Admitting: Ophthalmology

## 2024-11-14 ENCOUNTER — Ambulatory Visit: Payer: Self-pay | Admitting: Nurse Practitioner
